# Patient Record
Sex: Female | Born: 1955 | Race: White | Hispanic: No | Marital: Married | Smoking: Never smoker
Health system: Southern US, Community
[De-identification: ages and names within clinical notes are randomized; demographics above are authoritative.]

## PROBLEM LIST (undated history)

## (undated) DIAGNOSIS — M8580 Other specified disorders of bone density and structure, unspecified site: Secondary | ICD-10-CM

## (undated) DIAGNOSIS — Z981 Arthrodesis status: Secondary | ICD-10-CM

## (undated) DIAGNOSIS — IMO0002 Reserved for concepts with insufficient information to code with codable children: Secondary | ICD-10-CM

## (undated) DIAGNOSIS — F419 Anxiety disorder, unspecified: Secondary | ICD-10-CM

## (undated) DIAGNOSIS — N329 Bladder disorder, unspecified: Secondary | ICD-10-CM

## (undated) DIAGNOSIS — IMO0001 Reserved for inherently not codable concepts without codable children: Secondary | ICD-10-CM

## (undated) DIAGNOSIS — M5030 Other cervical disc degeneration, unspecified cervical region: Secondary | ICD-10-CM

## (undated) DIAGNOSIS — M542 Cervicalgia: Secondary | ICD-10-CM

## (undated) DIAGNOSIS — Z8049 Family history of malignant neoplasm of other genital organs: Secondary | ICD-10-CM

## (undated) DIAGNOSIS — N809 Endometriosis, unspecified: Secondary | ICD-10-CM

## (undated) DIAGNOSIS — E7800 Pure hypercholesterolemia, unspecified: Secondary | ICD-10-CM

## (undated) DIAGNOSIS — Z800 Family history of malignant neoplasm of digestive organs: Secondary | ICD-10-CM

## (undated) DIAGNOSIS — R51 Headache: Secondary | ICD-10-CM

## (undated) DIAGNOSIS — M25529 Pain in unspecified elbow: Secondary | ICD-10-CM

## (undated) DIAGNOSIS — M47812 Spondylosis without myelopathy or radiculopathy, cervical region: Secondary | ICD-10-CM

## (undated) DIAGNOSIS — Z803 Family history of malignant neoplasm of breast: Secondary | ICD-10-CM

## (undated) DIAGNOSIS — M797 Fibromyalgia: Secondary | ICD-10-CM

## (undated) DIAGNOSIS — M1990 Unspecified osteoarthritis, unspecified site: Secondary | ICD-10-CM

## (undated) DIAGNOSIS — M25519 Pain in unspecified shoulder: Principal | ICD-10-CM

## (undated) DIAGNOSIS — J8283 Eosinophilic asthma: Secondary | ICD-10-CM

## (undated) DIAGNOSIS — E785 Hyperlipidemia, unspecified: Secondary | ICD-10-CM

## (undated) DIAGNOSIS — N3010 Interstitial cystitis (chronic) without hematuria: Secondary | ICD-10-CM

## (undated) HISTORY — DX: Headache: R51

## (undated) HISTORY — DX: Family history of malignant neoplasm of digestive organs: Z80.0

## (undated) HISTORY — DX: Hyperlipidemia, unspecified: E78.5

## (undated) HISTORY — DX: Endometriosis, unspecified: N80.9

## (undated) HISTORY — DX: Other specified disorders of bone density and structure, unspecified site: M85.80

## (undated) HISTORY — DX: Reserved for concepts with insufficient information to code with codable children: IMO0002

## (undated) HISTORY — DX: Other cervical disc degeneration, unspecified cervical region: M50.30

## (undated) HISTORY — DX: Arthrodesis status: Z98.1

## (undated) HISTORY — DX: Pure hypercholesterolemia, unspecified: E78.00

## (undated) HISTORY — DX: Pain in unspecified elbow: M25.529

## (undated) HISTORY — DX: Unspecified osteoarthritis, unspecified site: M19.90

## (undated) HISTORY — DX: Family history of malignant neoplasm of breast: Z80.3

## (undated) HISTORY — DX: Family history of malignant neoplasm of other genital organs: Z80.49

## (undated) HISTORY — DX: Cervicalgia: M54.2

## (undated) HISTORY — DX: Fibromyalgia: M79.7

## (undated) HISTORY — DX: Reserved for inherently not codable concepts without codable children: IMO0001

## (undated) HISTORY — DX: Spondylosis without myelopathy or radiculopathy, cervical region: M47.812

## (undated) HISTORY — DX: Pain in unspecified shoulder: M25.519

## (undated) HISTORY — DX: Eosinophilic asthma: J82.83

## (undated) HISTORY — DX: Anxiety disorder, unspecified: F41.9

## (undated) HISTORY — DX: Interstitial cystitis (chronic) without hematuria: N30.10

## (undated) HISTORY — DX: Bladder disorder, unspecified: N32.9

---
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
## 1996-01-29 HISTORY — PX: LAPAROSCOPIC ASSISTED VAGINAL HYSTERECTOMY: SHX5398

## 1996-01-29 HISTORY — PX: HYSTERECTOMY ABDOMINAL WITH SALPINGO-OOPHORECTOMY: SHX6792

## 1998-09-15 ENCOUNTER — Ambulatory Visit (HOSPITAL_COMMUNITY): Admission: RE | Admit: 1998-09-15 | Discharge: 1998-09-15 | Payer: Self-pay | Admitting: Obstetrics and Gynecology

## 1998-09-15 ENCOUNTER — Encounter: Payer: Self-pay | Admitting: Obstetrics and Gynecology

## 1998-12-22 ENCOUNTER — Other Ambulatory Visit: Admission: RE | Admit: 1998-12-22 | Discharge: 1998-12-22 | Payer: Self-pay | Admitting: Obstetrics and Gynecology

## 1999-10-05 ENCOUNTER — Encounter: Payer: Self-pay | Admitting: Obstetrics and Gynecology

## 1999-10-05 ENCOUNTER — Ambulatory Visit (HOSPITAL_COMMUNITY): Admission: RE | Admit: 1999-10-05 | Discharge: 1999-10-05 | Payer: Self-pay | Admitting: Obstetrics and Gynecology

## 1999-12-07 ENCOUNTER — Other Ambulatory Visit: Admission: RE | Admit: 1999-12-07 | Discharge: 1999-12-07 | Payer: Self-pay | Admitting: Obstetrics and Gynecology

## 2000-08-08 ENCOUNTER — Encounter: Admission: RE | Admit: 2000-08-08 | Discharge: 2000-08-08 | Payer: Self-pay | Admitting: Obstetrics and Gynecology

## 2000-08-08 ENCOUNTER — Encounter: Payer: Self-pay | Admitting: Obstetrics and Gynecology

## 2000-12-19 ENCOUNTER — Other Ambulatory Visit: Admission: RE | Admit: 2000-12-19 | Discharge: 2000-12-19 | Payer: Self-pay | Admitting: Obstetrics and Gynecology

## 2002-01-08 ENCOUNTER — Other Ambulatory Visit: Admission: RE | Admit: 2002-01-08 | Discharge: 2002-01-08 | Payer: Self-pay | Admitting: Obstetrics and Gynecology

## 2002-11-19 ENCOUNTER — Encounter: Admission: RE | Admit: 2002-11-19 | Discharge: 2002-11-19 | Payer: Self-pay | Admitting: Obstetrics and Gynecology

## 2002-11-19 ENCOUNTER — Encounter: Payer: Self-pay | Admitting: Obstetrics and Gynecology

## 2003-01-28 ENCOUNTER — Other Ambulatory Visit: Admission: RE | Admit: 2003-01-28 | Discharge: 2003-01-28 | Payer: Self-pay | Admitting: Obstetrics and Gynecology

## 2003-12-09 ENCOUNTER — Encounter: Admission: RE | Admit: 2003-12-09 | Discharge: 2003-12-09 | Payer: Self-pay | Admitting: Obstetrics and Gynecology

## 2004-12-04 ENCOUNTER — Ambulatory Visit: Payer: Self-pay | Admitting: Family Medicine

## 2004-12-21 ENCOUNTER — Encounter: Admission: RE | Admit: 2004-12-21 | Discharge: 2004-12-21 | Payer: Self-pay | Admitting: Obstetrics and Gynecology

## 2006-01-17 ENCOUNTER — Encounter: Admission: RE | Admit: 2006-01-17 | Discharge: 2006-01-17 | Payer: Self-pay | Admitting: Obstetrics and Gynecology

## 2006-01-28 ENCOUNTER — Ambulatory Visit: Payer: Self-pay | Admitting: Unknown Physician Specialty

## 2007-01-30 ENCOUNTER — Encounter: Admission: RE | Admit: 2007-01-30 | Discharge: 2007-01-30 | Payer: Self-pay | Admitting: Obstetrics & Gynecology

## 2007-07-01 ENCOUNTER — Encounter: Payer: Self-pay | Admitting: Family Medicine

## 2007-08-01 ENCOUNTER — Encounter: Payer: Self-pay | Admitting: Family Medicine

## 2007-08-31 ENCOUNTER — Encounter: Payer: Self-pay | Admitting: Family Medicine

## 2007-10-01 ENCOUNTER — Encounter: Payer: Self-pay | Admitting: Family Medicine

## 2007-11-01 ENCOUNTER — Encounter: Payer: Self-pay | Admitting: Family Medicine

## 2007-11-18 ENCOUNTER — Other Ambulatory Visit: Payer: Self-pay

## 2007-11-18 ENCOUNTER — Emergency Department: Payer: Self-pay | Admitting: Emergency Medicine

## 2008-03-11 ENCOUNTER — Other Ambulatory Visit: Admission: RE | Admit: 2008-03-11 | Discharge: 2008-03-11 | Payer: Self-pay | Admitting: Obstetrics and Gynecology

## 2008-03-11 ENCOUNTER — Encounter: Admission: RE | Admit: 2008-03-11 | Discharge: 2008-03-11 | Payer: Self-pay | Admitting: Obstetrics & Gynecology

## 2009-03-17 ENCOUNTER — Encounter: Admission: RE | Admit: 2009-03-17 | Discharge: 2009-03-17 | Payer: Self-pay | Admitting: Obstetrics & Gynecology

## 2010-04-06 ENCOUNTER — Encounter: Admission: RE | Admit: 2010-04-06 | Discharge: 2010-04-06 | Payer: Self-pay | Admitting: Obstetrics & Gynecology

## 2010-07-31 HISTORY — PX: BLADDER SUSPENSION: SHX72

## 2010-10-21 ENCOUNTER — Encounter: Payer: Self-pay | Admitting: Obstetrics & Gynecology

## 2010-10-29 ENCOUNTER — Other Ambulatory Visit (HOSPITAL_COMMUNITY): Payer: Self-pay | Admitting: Neurological Surgery

## 2010-10-29 DIAGNOSIS — M542 Cervicalgia: Secondary | ICD-10-CM

## 2010-10-31 HISTORY — PX: SPINAL FUSION: SHX223

## 2010-11-16 ENCOUNTER — Ambulatory Visit (HOSPITAL_COMMUNITY)
Admission: RE | Admit: 2010-11-16 | Discharge: 2010-11-16 | Disposition: A | Discharge status: Home / Self Care | Payer: 59 | Source: Ambulatory Visit | Attending: Neurological Surgery | Admitting: Neurological Surgery

## 2010-11-16 DIAGNOSIS — M542 Cervicalgia: Secondary | ICD-10-CM

## 2010-11-16 DIAGNOSIS — M47812 Spondylosis without myelopathy or radiculopathy, cervical region: Secondary | ICD-10-CM | POA: Insufficient documentation

## 2010-11-16 MED ORDER — IOHEXOL 300 MG/ML  SOLN
10.0000 mL | Freq: Once | INTRAMUSCULAR | Status: AC | PRN
  Administered 2010-11-16: 10 mL via INTRAVENOUS

## 2010-11-16 MED ORDER — IOHEXOL 180 MG/ML  SOLN: 20.0000 mL | Freq: Once | INTRAMUSCULAR | Status: DC | PRN

## 2010-11-24 ENCOUNTER — Inpatient Hospital Stay (HOSPITAL_COMMUNITY)
Admission: EM | Admit: 2010-11-24 | Discharge: 2010-11-28 | DRG: 983 | Disposition: A | Discharge status: Home / Self Care | Payer: 59 | Attending: Neurological Surgery | Admitting: Neurological Surgery

## 2010-11-24 DIAGNOSIS — IMO0001 Reserved for inherently not codable concepts without codable children: Secondary | ICD-10-CM | POA: Diagnosis present

## 2010-11-24 DIAGNOSIS — F3289 Other specified depressive episodes: Secondary | ICD-10-CM | POA: Diagnosis present

## 2010-11-24 DIAGNOSIS — F329 Major depressive disorder, single episode, unspecified: Secondary | ICD-10-CM | POA: Diagnosis present

## 2010-11-24 DIAGNOSIS — F411 Generalized anxiety disorder: Secondary | ICD-10-CM | POA: Diagnosis present

## 2010-11-24 DIAGNOSIS — T380X5A Adverse effect of glucocorticoids and synthetic analogues, initial encounter: Secondary | ICD-10-CM | POA: Diagnosis not present

## 2010-11-24 DIAGNOSIS — E785 Hyperlipidemia, unspecified: Secondary | ICD-10-CM | POA: Diagnosis present

## 2010-11-24 DIAGNOSIS — R110 Nausea: Secondary | ICD-10-CM | POA: Diagnosis present

## 2010-11-24 DIAGNOSIS — D72829 Elevated white blood cell count, unspecified: Secondary | ICD-10-CM | POA: Diagnosis not present

## 2010-11-24 DIAGNOSIS — R0789 Other chest pain: Principal | ICD-10-CM | POA: Diagnosis present

## 2010-11-24 DIAGNOSIS — R51 Headache: Secondary | ICD-10-CM | POA: Diagnosis present

## 2010-11-24 DIAGNOSIS — K219 Gastro-esophageal reflux disease without esophagitis: Secondary | ICD-10-CM | POA: Diagnosis present

## 2010-11-24 DIAGNOSIS — T50995A Adverse effect of other drugs, medicaments and biological substances, initial encounter: Secondary | ICD-10-CM | POA: Diagnosis present

## 2010-11-24 DIAGNOSIS — E039 Hypothyroidism, unspecified: Secondary | ICD-10-CM | POA: Diagnosis present

## 2010-11-24 DIAGNOSIS — M47812 Spondylosis without myelopathy or radiculopathy, cervical region: Secondary | ICD-10-CM | POA: Diagnosis present

## 2010-11-25 ENCOUNTER — Emergency Department (HOSPITAL_COMMUNITY): Payer: 59

## 2010-11-25 LAB — URINALYSIS, ROUTINE W REFLEX MICROSCOPIC
Hgb urine dipstick: NEGATIVE
Ketones, ur: NEGATIVE mg/dL
Nitrite: NEGATIVE
Urobilinogen, UA: 0.2 mg/dL (ref 0.0–1.0)

## 2010-11-25 LAB — COMPREHENSIVE METABOLIC PANEL
AST: 20 U/L (ref 0–37)
Albumin: 3.9 g/dL (ref 3.5–5.2)
Chloride: 98 mEq/L (ref 96–112)
Creatinine, Ser: 0.88 mg/dL (ref 0.4–1.2)
GFR calc non Af Amer: >60 mL/min (ref >60)
Sodium: 130 mEq/L — ABNORMAL LOW (ref 135–145)
Total Protein: 7 g/dL (ref 6.0–8.3)

## 2010-11-25 LAB — CBC
HCT: 39.2 % (ref 36.0–46.0)
MCH: 29.2 pg (ref 26.0–34.0)
MCV: 88.1 fL (ref 78.0–100.0)
Platelets: 219 10*3/uL (ref 150–400)
WBC: 6.5 10*3/uL (ref 4.0–10.5)

## 2010-11-25 LAB — PROTIME-INR
INR: 0.87 (ref 0.00–1.49)
Prothrombin Time: 12 seconds (ref 11.6–15.2)

## 2010-11-25 LAB — LIPID PANEL
HDL: 67 mg/dL (ref >39)
Total CHOL/HDL Ratio: 3.4 RATIO

## 2010-11-25 LAB — POCT CARDIAC MARKERS
CKMB, poc: <1 ng/mL — ABNORMAL LOW (ref 1.0–8.0)
Myoglobin, poc: 45 ng/mL (ref 12–200)
Troponin i, poc: <0.05 ng/mL (ref 0.00–0.09)
Troponin i, poc: <0.05 ng/mL (ref 0.00–0.09)

## 2010-11-25 LAB — CK TOTAL AND CKMB (NOT AT ARMC): CK, MB: 0.9 ng/mL (ref 0.3–4.0)

## 2010-11-25 LAB — CARDIAC PANEL(CRET KIN+CKTOT+MB+TROPI)
Relative Index: INVALID (ref 0.0–2.5)
Relative Index: INVALID (ref 0.0–2.5)
Total CK: 57 U/L (ref 7–177)
Troponin I: <0.01 ng/mL (ref 0.00–0.06)

## 2010-11-25 LAB — TROPONIN I: Troponin I: <0.01 ng/mL (ref 0.00–0.06)

## 2010-11-25 LAB — DIFFERENTIAL
Lymphs Abs: 2.4 10*3/uL (ref 0.7–4.0)
Monocytes Absolute: 0.5 10*3/uL (ref 0.1–1.0)
Monocytes Relative: 8 % (ref 3–12)

## 2010-11-25 LAB — TSH: TSH: 6.469 u[IU]/mL — ABNORMAL HIGH (ref 0.350–4.500)

## 2010-11-26 DIAGNOSIS — R209 Unspecified disturbances of skin sensation: Secondary | ICD-10-CM

## 2010-11-26 LAB — COMPREHENSIVE METABOLIC PANEL
ALT: 17 U/L (ref 0–35)
Alkaline Phosphatase: 41 U/L (ref 39–117)
BUN: 11 mg/dL (ref 6–23)
CO2: 24 mEq/L (ref 19–32)
Chloride: 106 mEq/L (ref 96–112)
GFR calc non Af Amer: >60 mL/min (ref >60)
Glucose, Bld: 156 mg/dL — ABNORMAL HIGH (ref 70–99)
Potassium: 4.1 mEq/L (ref 3.5–5.1)
Sodium: 135 mEq/L (ref 135–145)
Total Bilirubin: 0.2 mg/dL — ABNORMAL LOW (ref 0.3–1.2)
Total Protein: 6.2 g/dL (ref 6.0–8.3)

## 2010-11-26 LAB — CBC
HCT: 39.3 % (ref 36.0–46.0)
Hemoglobin: 12.9 g/dL (ref 12.0–15.0)
MCV: 89.5 fL (ref 78.0–100.0)
RBC: 4.39 MIL/uL (ref 3.87–5.11)
WBC: 10 10*3/uL (ref 4.0–10.5)

## 2010-11-26 LAB — CARDIAC PANEL(CRET KIN+CKTOT+MB+TROPI): Troponin I: 0.02 ng/mL (ref 0.00–0.06)

## 2010-11-26 LAB — SEDIMENTATION RATE: Sed Rate: 9 mm/hr (ref 0–22)

## 2010-11-27 ENCOUNTER — Observation Stay (HOSPITAL_COMMUNITY): Payer: 59

## 2010-11-27 LAB — CBC
HCT: 35.4 % — ABNORMAL LOW (ref 36.0–46.0)
MCH: 28.9 pg (ref 26.0–34.0)
MCV: 88.3 fL (ref 78.0–100.0)
RDW: 13.6 % (ref 11.5–15.5)
WBC: 18.7 10*3/uL — ABNORMAL HIGH (ref 4.0–10.5)

## 2010-11-27 LAB — DIFFERENTIAL
Eosinophils Relative: 0 % (ref 0–5)
Lymphocytes Relative: 5 % — ABNORMAL LOW (ref 12–46)
Lymphs Abs: 1 10*3/uL (ref 0.7–4.0)
Monocytes Relative: 1 % — ABNORMAL LOW (ref 3–12)

## 2010-11-27 LAB — URINALYSIS, ROUTINE W REFLEX MICROSCOPIC
Bilirubin Urine: NEGATIVE
Hgb urine dipstick: NEGATIVE
Ketones, ur: NEGATIVE mg/dL
Protein, ur: NEGATIVE mg/dL
Urobilinogen, UA: 0.2 mg/dL (ref 0.0–1.0)

## 2010-11-27 LAB — BASIC METABOLIC PANEL
BUN: 11 mg/dL (ref 6–23)
Chloride: 109 mEq/L (ref 96–112)
Glucose, Bld: 158 mg/dL — ABNORMAL HIGH (ref 70–99)
Potassium: 4.2 mEq/L (ref 3.5–5.1)

## 2010-11-27 LAB — HEMOGLOBIN A1C: Mean Plasma Glucose: 123 mg/dL — ABNORMAL HIGH (ref <117)

## 2010-11-27 LAB — T3, FREE: T3, Free: 2.1 pg/mL — ABNORMAL LOW (ref 2.3–4.2)

## 2010-11-27 LAB — T4: T4, Total: 3.6 ug/dL — ABNORMAL LOW (ref 5.0–12.5)

## 2010-11-27 LAB — TSH: TSH: 0.921 u[IU]/mL (ref 0.350–4.500)

## 2010-11-28 LAB — CBC
MCH: 29.2 pg (ref 26.0–34.0)
MCV: 88.9 fL (ref 78.0–100.0)
Platelets: 234 10*3/uL (ref 150–400)
RDW: 13.8 % (ref 11.5–15.5)

## 2010-11-28 LAB — BASIC METABOLIC PANEL
BUN: 8 mg/dL (ref 6–23)
Chloride: 103 mEq/L (ref 96–112)
Creatinine, Ser: 0.63 mg/dL (ref 0.4–1.2)
GFR calc non Af Amer: >60 mL/min (ref >60)

## 2010-11-28 LAB — ANA: Anti Nuclear Antibody(ANA): POSITIVE — AB

## 2010-11-28 LAB — ANTI-NUCLEAR AB-TITER (ANA TITER): ANA Titer 1: NEGATIVE

## 2010-12-01 NOTE — Consult Note (Signed)
NAMESTEFANA, [name] Brown                 ACCOUNT NO.:  [id number]  MEDICAL RECORD NO.:  [id number]           PATIENT TYPE:  I  LOCATION:  3733                         FACILITY:  [facility]  PHYSICIAN:  Thana Farr, MD    DATE OF BIRTH:  Mar 09, 1956  DATE OF CONSULTATION:  11/25/2010 DATE OF DISCHARGE:                                CONSULTATION   CONSULT CALLED BY:  Eduard Clos, MD  HISTORY:  Ms. Falcon is a 55 year old female that was admitted with complaints of chest pain.  She reports that she had a myelogram on November 17, 2010.  She did well for the first 48 hours.  After the first 48 hours, she developed a headache.  It has been fairly persistent since that time.  She describes the headache as being frontal and throbbing.  It is associated with nausea.  It has been as severe as a 9/10 at times.  It is not as severe currently.  It does seem that there have been medications that could decrease the severity of the headache, but nothing has taken it away completely.  She does not describe any aggravation of the pain, by any movements or any position.  PAST MEDICAL HISTORY: 1. Depression and anxiety. 2. Cervical radiculopathy.  MEDICATIONS:  Paxil and Xanax.  SOCIAL HISTORY:  The patient is married.  She has no history of alcohol, tobacco, or illicit drug abuse.  PHYSICAL EXAMINATION:  Blood pressure 112/75, heart rate 73, respiratory rate 20, and temperature 98.8.  On mental status testing, the patient is alert and oriented.  She can follow commands without difficulty.  Speech is fluent.  On cranial nerve testing, II, visual fields full.  III, IV, and VI, extraocular movements are intact.  V and VII, smile symmetric. VIII, grossly intact.  IX and VII, positive gag.  XI, bilateral shoulder shrug.  XII, midline tongue extension.  On motor exam, the patient is 5/5 throughout.  There is normal tone and bulk.  Sensory, pinprick and light touch are intact bilaterally.  Deep  tendon reflexes are 2+ throughout.  Plantars are downgoing bilaterally.  On cerebellar testing, finger-to-nose and heel-to-shin intact.  LABORATORY DATA:  White blood cell count 6.5, platelet count 219, hemoglobin and hematocrit 13 and 39.2 respectively.  PT/INR 12.0 and 0.87.  Sodium 130, potassium 3.5, chloride 98, CO2 of 28, BUN 15, creatinine 0.88, glucose 92, total bili 0.3, alk phos 52, SGOT/SGPT 20 and 80 respectively.  Total protein 7.0, albumin 3.9, calcium 8.6, CK 57.  TSH 6.469.  Urinalysis unremarkable.  Head CT, normal noncontrast CT of the brain.  ASSESSMENT:  Ms. Nyland is a 55 year old female that has developed a headache since her myelogram on November 17, 2010.  The current features of the headaches are not suggestive of a spinal headache.  They do not seem to have ever had those particular characteristics.  She may be experiencing a sensitivity reaction to the contrast dye used.  Cannot rule out the possibility that her headache may be related to her abnormal thyroid function test as well.  PLAN: 1. The patient is to have followup  of her abnormal thyroid studies and     an ESR. 2. The patient is to have Solu-Medrol 125 mg IV x1 now, then we would     start prednisone on November 26, 2010, at 60 mg a day with taper     based on the patient's response with pain.          ______________________________ Thana Farr, MD     LR/MEDQ  D:  11/25/2010  T:  11/26/2010  Job:  [id number]  Electronically Signed by Thana Farr MD on 12/01/2010 02:02:43 PM

## 2010-12-10 ENCOUNTER — Ambulatory Visit (HOSPITAL_COMMUNITY): Admission: RE | Admit: 2010-12-10 | Payer: 59 | Source: Ambulatory Visit | Admitting: Neurological Surgery

## 2010-12-11 NOTE — Consult Note (Signed)
  [name] Brown, [name] Brown                 ACCOUNT NO.:  [id number]  MEDICAL RECORD NO.:  [id number]           PATIENT TYPE:  I  LOCATION:  3020                         FACILITY:  [facility]  PHYSICIAN:  Stefani Dama, M.D.  DATE OF BIRTH:  [birth year]  DATE OF CONSULTATION:  11/26/2010 DATE OF DISCHARGE:                                CONSULTATION   REQUESTING PHYSICIAN:  Dr. Noel Gerold, [facility].  REASON FOR REQUEST:  Cervical radiculopathy, intractable neck pain.  HISTORY OF PRESENT ILLNESS:  [name] Brown is a 55 year old white female who was seen by me over several months.  She has been complaining of difficulty with neck pain in the last week.  We did a myelogram and postmyelogram CAT scan.  I discussed the studies with her immediately after they were completed and indicated that she had spondylitic disease in the cervical spine.  It is planned that sometime in mid March she would undergo anterior cervical decompression arthrodesis at C5-C7.  The patient had developed worsening neck pain and headache.  This was initially thought that this may be related to a myelogram and her postmyelogram syndrome.  However, she notes that after the myelogram for the first 2 days, she felt fairly well.  She subsequently developed headache, neck pain, stiffness, shoulder pain on the left side with pain in the chest wall.  Because of the nature of the chest wall pain, she was admitted to the hospital and she was evaluated cardiologically and found to be within limits of normal.  Because of the cervical difficulties, we are consulted now to help manage her neck pain or cervical radiculopathy and possibly advance to schedule her planned surgical intervention.  PAST MEDICAL HISTORY:  Noted in the chart, is not repeated here.  PHYSICAL EXAMINATION:  She is alert, oriented.  She does not have any meningismus.  Neck moves well, although she has some limited range of motion turning side-to-side 6  degrees.  She flexes and extends modestly. Her motor strength is good in the deltoids, biceps, triceps, grips, and intrinsics though there is give-way in the biceps and triceps testing and the grip strength would be graded at 4/5.  Her sensation appears intact.  Reflexes are 2+ and symmetric in the biceps and triceps, 2+ in the grip.  IMPRESSION:  Review of the patient's myelogram and postmyelogram CAT scan demonstrates spondylitic processes at C5-6 and C6-7.  There is no absolute urgency regarding surgery; however, given that the patient is hospitalized with substantial neck pain, not responsive to multiple medications including intravenous steroid medication, I discussed proceeding with surgery on the more urgent basis.  She is eager to do this because she is tired of the neck pain.  We will plan on scheduling her surgery in the next day or so to accommodate her needs.    Stefani Dama, M.D.    Merla Riches  D:  11/27/2010  T:  11/28/2010  Job:  [id number]  Electronically Signed by Barnett Abu M.D. on 12/11/2010 09:29:59 AM

## 2010-12-11 NOTE — Op Note (Signed)
NAMECHANNA, HAZELETT                 ACCOUNT NO.:  [id number]  MEDICAL RECORD NO.:  [id number]           PATIENT TYPE:  I  LOCATION:  3020                         FACILITY:  [facility]  PHYSICIAN:  Stefani Dama, M.D.  DATE OF BIRTH:  12/31/1955  DATE OF PROCEDURE:  11/27/2010 DATE OF DISCHARGE:                              OPERATIVE REPORT   PREOPERATIVE DIAGNOSES:  Cervical spondylosis with radiculopathy, C5-6 and C6-7.  POSTOPERATIVE DIAGNOSIS:  Cervical spondylosis with radiculopathy, C5-6 and C6-7.  PROCEDURE:  Anterior cervical decompression, C5-6 and C6-7, arthrodesis with structural allograft, Alphatec plate fixation, C5 to C7.  SURGEON:  Stefani Dama, MD  FIRST ASSISTANT:  Cristi Loron, MD  ANESTHESIA:  General endotracheal.  INDICATIONS:  [name] Brown is a 55 year old individual who has had significant neck, shoulder, and arm pain, worse on left side.  She had myelography which showed recapitulated findings seen on MRI some time ago.  She had a severe exacerbation of pain requiring hospital stay and workup of her cardiac status yielded no problems.  The patient did have myelogram recently that showed spondylitic disease at C5-6 and C6-7. Having failed efforts at conservative management, she is taken now to the operating room to undergo decompression of these 2 levels.  PROCEDURE:  The patient was brought to the operating room supine on the stretcher.  After smooth induction of general endotracheal anesthesia, the neck was prepped with alcohol and DuraPrep and draped in a sterile fashion.  A transverse incision was created on the left side of the neck.  This was carried down through the platysma.  The plane between the sternocleidomastoid and strap muscles dissected bluntly and the prevertebral space was reached.  First identifiable disk space was noted to be that of C5-6 on localizing radiograph and dissection was carried out so as to expose the spaces at  C5-6 and C6-7.  Procedure was started by opening the anterior longitudinal ligament of C6-7, first with 15 blade and then using a combination of Leksell rongeur to remove some ventral osteophytes and disk space was entered and a significant quantity of severely desiccated disk material was encountered.  This was evacuated until the region of posterior longitudinal ligament was reached where a high-speed drill and 2-mm bit was used to remove osteophytosis from the inferior margin of the disk space at C6 and out to and including the uncinate processes bilaterally.  Once these were decompressed, the common dural tube and the nerve roots could well be decompressed by taking up the disk material right up into the subligamentous space.  Small bony spurs from the inferior margin of body of C6 were removed also.  Once decompression was obtained thoroughly and hemostasis was established, a 7-mm transgraft was shaved to the appropriate size and configuration to fit in the interspace.  This was filled with demineralized bone matrix.  Attention was then turned to C5- 6 where a similar procedure was carried out evacuating a significant quantity of severely degenerated and desiccated disk material from within the disk space.  Ventral and lateral osteophytes were also removed in a similar fashion.  With  good decompression being obtained and hemostasis being established, a 7-mm transgraft was shaped to the appropriate size and configuration and filled with demineralized bone matrix and fitted into the intervertebral space in a similar fashion. The lateral recesses were also filled with some bone graft in the form of demineralized bone matrix.  Then, a 30-mm Trestle plate was fitted to the ventral aspect the vertebral bodies from C5 to C7 and secured with 12-mm variable angle screws.  Final localizing x-ray was obtained which showed good position of the implanted hardware.  The wound was then copiously  irrigated with antibiotic-irrigating solution and then the platysma was closed with 3-0 Vicryl in inverted interrupted fashion and 3-0 Vicryl in subcuticular tissues.  Dermabond was placed on the skin. The patient tolerated the procedure well and was returned to recovery room in stable condition.     Stefani Dama, M.D.     Merla Riches  D:  11/27/2010  T:  11/28/2010  Job:  [id number]  Electronically Signed by Barnett Abu M.D. on 12/11/2010 09:29:56 AM

## 2010-12-31 NOTE — H&P (Signed)
Brown Brown                 ACCOUNT NO.:  [id number]  MEDICAL RECORD NO.:  [id number]           PATIENT TYPE:  LOCATION:                                 FACILITY:  PHYSICIAN:  Eduard Clos, MDDATE OF BIRTH:  1955-11-15  DATE OF ADMISSION: DATE OF DISCHARGE:                             HISTORY & PHYSICAL   PRIMARY CARE PHYSICIAN:  Dr. Nida Boatman.  CHIEF COMPLAINT:  Chest pain.  HISTORY OF PRESENT ILLNESS:  This 55 year old female with a history of depression presented complaining of chest pain.  The patient states chest pain started last evening at around 8:30 which is retrosternal, nonradiating, pressure like, associated with mild shortness of breath. The chest pain lasted for almost 2 hours, it got better after the patient came to the ER and got some nitroglycerin.  In addition, the patient has been having some headache.  The headache has been there for almost 2 years but has worsened in the last one week after the patient had a myelogram.  The headache is all over the head.  Denies any associated visual symptoms or any focal deficit.  The patient does have chronic right-sided upper extremity mild weakness, for which Dr. Danielle Dess is planning to do a cervical surgery later on and for the same reason, the patient had a myelogram.  The patient denies any nausea, vomiting, abdominal pain, dysuria, discharge, diarrhoea, or any fever or chills, cough, or phlegm.  PAST MEDICAL HISTORY:  Depression and anxiety and has been having chronic right-sided weakness with neck pain.  PAST SURGICAL HISTORY:  Hysterectomy.  MEDICATIONS PRIOR TO ADMISSION: 1. Paxil 40 mg p.o. daily. 2. Xanax 1 mg p.o. at bedtime.  FAMILY HISTORY:  Positive for breast cancer.  The patient is aware of breast cancer screening.  It is also positive for coronary artery disease in her dad who also subsequently underwent cardiac transplant.  ALLERGIES:  CODEINE.  SOCIAL HISTORY:  The patient is  married, lives with her husband, is a full code, denies smoking cigarettes, drinking alcohol, using illegal drugs.  REVIEW OF SYSTEMS:  As per the history of present illness, nothing else significant.  PHYSICAL EXAMINATION:  GENERAL:  The patient is examined at bedside, not in acute distress. VITAL SIGNS:  Blood pressure 120/66, pulse 75 per minute, temperature 97.8, respirations 18 per minute, O2 sat 100%. HEENT:  Anicteric.  No pallor.  No discharge from ears, eyes, nose, or mouth. CHEST:  Bilateral air entry present.  No rhonchi, no crepitation. HEART:  S1 and S2 heard. ABDOMEN:  Soft, nontender.  Bowel sounds heard. CENTRAL NERVOUS SYSTEM:  The patient is alert, awake, and oriented to time, place, and person.  Moves upper and lower extremities 5/5. EXTREMITIES:  Peripheral pulses felt.  No edema.  LABORATORY DATA:  EKG shows normal sinus rhythm with heart rate of 82 beats per minute with nonspecific ST-T changes. Chest x-ray shows no acute cardiopulmonary abnormality. CT of the head without contrast shows normal noncontrast CT appearance of the brain, intrathecal subarachnoid contrast has cleared, chronic sinus disease status post bilateral sinus surgery. CBC:  WBC 6.5, hemoglobin is 13,  hematocrit is 39.2, platelets 219, eosinophils 11%.  PT and INR are 12 and 0.87.  Complete metabolic panel: Sodium 130, potassium 3.4, chloride 98, carbon dioxide 28, glucose 92, BUN 15, creatinine 0.8.  Total bilirubin is 0.3, alkaline phosphatase is 52, AST 20, ALT 18, total protein 7, albumin 3.9, calcium 8.6.  CK-MB less than 1, troponin less than 0.05.  UA is negative.  ASSESSMENT: 1. Chest pain, rule out acute coronary syndrome. 2. Headache with recent myelogram. 3. 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 4. Eosinophilia, unclear etiology.  PLAN: 1. At this time, I will admit the patient to telemetry. 2. For her chest pain, the patient will be placed on p.r.n.      nitroglycerin and aspirin, cycle cardiac markers, get 2-D echo. 3. For her headache, at this time, as the patient had a recent     myelogram, we will IV hydrate.  The patient will be on p.r.n. pain     medication.  If the headache persists, then we may have to get a     consult and recommendations.     Eduard Clos, MD     ANK/MEDQ  D:  11/25/2010  T:  11/25/2010  Job:  [id number]  Electronically Signed by Midge Minium MD on 12/31/2010 07:19:11 AM

## 2011-01-14 NOTE — Discharge Summary (Signed)
  [name] Brown, [name] Brown                 ACCOUNT NO.:  [id number]  MEDICAL RECORD NO.:  [id number]           PATIENT TYPE:  I  LOCATION:  3020                         FACILITY:  [facility]  PHYSICIAN:  Stefani Dama, M.D.  DATE OF BIRTH:  1955/10/10  DATE OF ADMISSION:  11/24/2010 DATE OF DISCHARGE:  11/28/2010                              DISCHARGE SUMMARY   ADMITTING DIAGNOSES:  Headache, cervical spondylosis radiculopathy C5-6 and C6-7.  DISCHARGE DIAGNOSES:  Headache, cervical spondylosis radiculopathy C5-6 and C6-7, atypical chest pain, negative for cardiac event, mild hypothyroidism.  OPERATIONS AND PROCEDURES:  Anterior cervical decompression and fusion C5-6, C6-7.  CONSULTATIONS:  The patient was admitted on [facility]. Dr. Barnett Abu was consulted for the Neurosurgical consult for cervical spine spondylosis and radiculopathy transferred to his service on November 26, 2010.  She is a 55 year old female who had significant neck, shoulder, and arm pain worse on left side.  Myelography showed some spondylitic changes at C5-6, C6-7, failed conservative management, taken to the operating to decompress and infuse these 2 levels.  She tolerated this procedure well, stabilized in recovery room, placed on the floor for pain management physical therapy, occupational therapy.  Surgery was actually on November 27, 2010.  Postoperatively, she was doing well, eating well, voiding well, ambulating safely with following neck precautions ready for discharge home.  Cardiac status was stable.  DISCHARGE CONDITION:  Stable, improved.  DISCHARGE INSTRUCTIONS:  Discharged home and follow neck precautions, soft cervical collar for comfort.  Home medications are to be continued. She should take: 1. Vitamin D 50,000 units every other week. 2. Tramadol 50 mg p.o. daily p.r.n. pain. 3. Paxil 40 mg p.o. daily. 4. Multivitamins over the counter p.o. daily. 5. Magnesium  over-the-counter 1 tablet p.o. bedtime. 6. Fish oil over-the-counter twice daily. 7. Alprazolam 1 mg p.o. bedtime p.r.n.  She is given prescription for Lipitor, Valium, and Percocet.  Her IV was discontinued prior to discharge home.  Contact our office prior to follow up with any question or concerns.  Follow up in 3-4 weeks. Continue with neck precautions.  Regular diet, may shower.  All questions encouraged and answered.  She is to follow up with her cardiologist in about 2-3 weeks.  She is placed back on Lipitor because she does have hypercholesterolemia and is to be discussed with her cardiologist where she will continue on this.  DISCHARGE CONDITION:  Stable and improved.     [name] Brown.   ______________________________ Stefani Dama, M.D.    SCI/MEDQ  D:  12/27/2010  T:  12/28/2010  Job:  [id number]  Electronically Signed by Orlin Hilding P.A. on 01/03/2011 12:51:45 PM Electronically Signed by Barnett Abu M.D. on 01/14/2011 10:12:03 AM

## 2011-04-02 ENCOUNTER — Ambulatory Visit: Payer: Self-pay | Admitting: Unknown Physician Specialty

## 2011-04-08 ENCOUNTER — Other Ambulatory Visit: Payer: Self-pay | Admitting: Obstetrics & Gynecology

## 2011-04-08 DIAGNOSIS — Z1231 Encounter for screening mammogram for malignant neoplasm of breast: Secondary | ICD-10-CM

## 2011-04-15 ENCOUNTER — Ambulatory Visit
Admission: RE | Admit: 2011-04-15 | Discharge: 2011-04-15 | Disposition: A | Discharge status: Home / Self Care | Payer: 59 | Source: Ambulatory Visit | Attending: Obstetrics & Gynecology | Admitting: Obstetrics & Gynecology

## 2011-04-15 DIAGNOSIS — Z1231 Encounter for screening mammogram for malignant neoplasm of breast: Secondary | ICD-10-CM

## 2011-07-24 ENCOUNTER — Ambulatory Visit: Payer: Self-pay | Admitting: Orthopedic Surgery

## 2011-07-31 ENCOUNTER — Ambulatory Visit: Payer: Self-pay | Admitting: Orthopedic Surgery

## 2011-08-01 HISTORY — PX: ELBOW SURGERY: SHX618

## 2012-03-09 ENCOUNTER — Other Ambulatory Visit: Payer: Self-pay | Admitting: Obstetrics & Gynecology

## 2012-03-09 DIAGNOSIS — Z1231 Encounter for screening mammogram for malignant neoplasm of breast: Secondary | ICD-10-CM

## 2012-04-23 ENCOUNTER — Ambulatory Visit
Admission: RE | Admit: 2012-04-23 | Discharge: 2012-04-23 | Disposition: A | Discharge status: Home / Self Care | Payer: 59 | Source: Ambulatory Visit | Attending: Obstetrics & Gynecology | Admitting: Obstetrics & Gynecology

## 2012-04-23 DIAGNOSIS — Z1231 Encounter for screening mammogram for malignant neoplasm of breast: Secondary | ICD-10-CM

## 2012-11-05 DIAGNOSIS — N3010 Interstitial cystitis (chronic) without hematuria: Secondary | ICD-10-CM | POA: Insufficient documentation

## 2013-03-29 ENCOUNTER — Encounter: Payer: Self-pay | Admitting: Neurology

## 2013-03-29 ENCOUNTER — Ambulatory Visit (INDEPENDENT_AMBULATORY_CARE_PROVIDER_SITE_OTHER): Payer: Medicare Other | Admitting: Neurology

## 2013-03-29 VITALS — BP 108/62 | HR 72 | Ht 60.5 in | Wt 114.0 lb

## 2013-03-29 DIAGNOSIS — IMO0001 Reserved for inherently not codable concepts without codable children: Secondary | ICD-10-CM

## 2013-03-29 DIAGNOSIS — R52 Pain, unspecified: Secondary | ICD-10-CM

## 2013-03-29 DIAGNOSIS — R51 Headache: Secondary | ICD-10-CM | POA: Insufficient documentation

## 2013-03-29 DIAGNOSIS — M797 Fibromyalgia: Secondary | ICD-10-CM

## 2013-03-29 DIAGNOSIS — R519 Headache, unspecified: Secondary | ICD-10-CM | POA: Insufficient documentation

## 2013-03-29 MED ORDER — DULOXETINE HCL 60 MG PO CPEP: 60.0000 mg | ORAL_CAPSULE | Freq: Every day | ORAL | Status: DC

## 2013-03-29 MED ORDER — AMITRIPTYLINE HCL 25 MG PO TABS: 25.0000 mg | ORAL_TABLET | Freq: Every day | ORAL | Status: DC

## 2013-03-29 NOTE — Patient Instructions (Addendum)
I think overall you are doing fairly well but I do want to suggest a few things today:  Remember to drink plenty of fluid, eat healthy meals and do not skip any meals. Try to eat protein with a every meal and eat a healthy snack such as fruit or nuts in between meals. Try to keep a regular sleep-wake schedule and try to exercise daily, particularly in the form of walking, 20-30 minutes a day, if you can.   Please remember, common headache triggers are: sleep deprivation, dehydration, overheating, stress, hypoglycemia or skipping meals, excessive pain medications or excessive alcohol use. Some people have food triggers such as aged cheese, orange juice or chocolate, especially dark chocolate. Try to avoid these headache triggers as much possible. It may be helpful to keep a headache diary to figure out what makes your headaches worse or brings them on. Some people report headache onset after exercise but studies have shown that regular exercise may actually prevent headaches from coming on. If you have exercise-induced headaches, please make sure that you drink plenty of fluid before and after exercising and that you don't over do it.  As far as your medications are concerned, I would like to suggest no changes. I have refilled you meds.    As far as diagnostic testing: no new test needed at this time.   Our phone number is [phone]. We also have an after hours call service for urgent matters and there is a physician on-call for urgent questions. For any emergencies you know to call 911 or go to the nearest emergency room.   I suggest followup with one of my colleagues in about 6 months. Alverda Skeans, one of our nurses will call you for your appointment.

## 2013-03-29 NOTE — Progress Notes (Signed)
Subjective:    Patient ID: [name] Brown is a 57 y.o. female.  HPI   Interim history:   [name] Brown is a very pleasant 57 year old right-handed woman who presents for followup consultation for her pain syndrome. She is unaccompanied today. This is her first visit and she previously followed with Dr. Fayrene Fearing love and was last seen by him on 11/27/2012, at which time he wanted to increase her Cymbalta. However, she had some wt gain and did not go up to 90 mg. He felt that she had generalized pain and localized pain in her right elbow, both legs and left knee in particular. She has an underlying medical history of hyperlipidemia, migraine headaches, anxiety, osteoarthritis, musculoskeletal pain, and fibromyalgia. She had neck surgery in February 2012 with titanium plate, bladder surgery, tonsillectomy, hysterectomy, right elbow surgery. She is currently on Cymbalta 60 mg daily, omeprazole, amitriptyline 25 mg daily, omega-3, vitamin D, magnesium, Xanax 1 mg once daily, multivitamin.   I reviewed Dr. Imagene Gurney prior notes and the patient's records and below is a summary of that review:  2 role right-handed woman who started seeing Dr. love in April 2009 for chronic neck pain, unexplained weakness and vague symptoms of pain. She has a known history of migraine headaches that occur in the frontal regions, associated with nausea and vomiting. Inferior 2009 she developed sudden onset of numbness and tingling from head to toes associated with difficulty walking. MRI brain in April 2009 was normal and cervical spine MRI in February 2009 showed spondylosis. She had a fairly normal neurological exam. She had neck surgery in February 2012 under Dr. Danielle Dess. In May 2011 she developed pain in her right arm and elbow. EMG and nerve conduction studies in 2012 was normal. MRI elbow in May 2012 was normal. She had physical therapy without improvement. She had elbow surgery on the right. She stopped working in 2 2012. She saw a  rheumatologist in January 2012 for a positive ANA, one is to 80 titer, negative rheumatoid factor, normal CBC, normal ESR and there was no specific evidence of rheumatological disease but she was diagnosed with fibromyalgia. In February 2012 she was admitted to the hospital for chest pain and her TSH was elevated. [facility] spotted fever titer, Lyme titer were normal in June 2012. She takes care of her mother. Lyrica and gabapentin caused tingling. Effexor caused chest pain and Flexeril cause confusion. She had bladder surgery in November 2011. Water therapy has aggravated her interstitial cystitis. She has a history of dream enactments.   She has no new complaints. She has been having intermittent headaches. She is a retired [employer] forces operational officer. She has had difficulty with repetitive movements on the R hand. She has intermittent R hand numbness, after her neck and elbow surgery.   Her Past Medical History Is Significant For: Past Medical History  Diagnosis Date  . Headache(784.0)   . Myalgia and myositis, unspecified   . Unspecified disorder of bladder   . Other and unspecified hyperlipidemia   . Osteoarthrosis, unspecified whether generalized or localized, unspecified site   . Pain in joint, upper arm   . Cervical spondylosis without myelopathy   . Degeneration of cervical intervertebral disc     Her Past Surgical History Is Significant For: Past Surgical History  Procedure Laterality Date  . Spine surgery  10/2010  . Bladder surgery  07/2010  . Tonsillectomy  1969  . Abdominal hysterectomy  01/1996  . Elbow surgery Right 08/2011    Her  Family History Is Significant For: Family History  Problem Relation Age of Onset  . Osteoarthritis Mother   . Osteoarthritis Maternal Grandfather   . Cancer Paternal Grandmother     Her Social History Is Significant For: History   Social History  . Marital Status: Divorced    Spouse Name: N/A    Number of Children: N/A  . Years of Education:  N/A   Social History Main Topics  . Smoking status: Never Smoker   . Smokeless tobacco: None  . Alcohol Use: 0.5 oz/week    1 drink(s) per week  . Drug Use: No  . Sexually Active: None   Other Topics Concern  . None   Social History Narrative  . None    Her Allergies Are:  Allergies  Allergen Reactions  . Codeine   :   Her Current Medications Are:  Outpatient Encounter Prescriptions as of 03/29/2013  Medication Sig Dispense Refill  . ALPRAZolam (XANAX) 1 MG tablet Take 1 mg by mouth at bedtime as needed for sleep.      Marland Kitchen amitriptyline (ELAVIL) 25 MG tablet Take 1 tablet (25 mg total) by mouth at bedtime.  30 tablet  5  . DULoxetine (CYMBALTA) 60 MG capsule Take 1 capsule (60 mg total) by mouth daily.  30 capsule  5  . fish oil-omega-3 fatty acids 1000 MG capsule Take 960 mg by mouth daily.      . Magnesium 500 MG CAPS Take 1 capsule by mouth at bedtime.      . Multiple Vitamins-Minerals (MULTIVITAMIN PO) Take 1 tablet by mouth daily.      Marland Kitchen omeprazole (PRILOSEC) 40 MG capsule Take 40 mg by mouth daily.      . [DISCONTINUED] amitriptyline (ELAVIL) 25 MG tablet Take 25 mg by mouth at bedtime.      . [DISCONTINUED] DULoxetine (CYMBALTA) 60 MG capsule Take 60 mg by mouth daily.       No facility-administered encounter medications on file as of 03/29/2013.   Review of Systems  Constitutional: Positive for fatigue.  Gastrointestinal: Positive for constipation.  Endocrine:       Hot flashes  Genitourinary:       Incontinence  Musculoskeletal: Positive for myalgias and arthralgias.  Neurological: Positive for weakness.  Psychiatric/Behavioral: Positive for sleep disturbance (insomnia). The patient is nervous/anxious.     Objective:  Neurologic Exam  Physical Exam Physical Examination:   Filed Vitals:   03/29/13 0940  BP: 108/62  Pulse: 72    General Examination: The patient is a very pleasant 57 y.o. female in no acute distress. She appears well-developed and  well-nourished and well groomed.   HEENT: Normocephalic, atraumatic, pupils are equal, round and reactive to light and accommodation. Funduscopic exam is normal with sharp disc margins noted. Extraocular tracking is good without limitation to gaze excursion or nystagmus noted. Normal smooth pursuit is noted. Hearing is grossly intact. Tympanic membranes are clear bilaterally. Face is symmetric with normal facial animation and normal facial sensation. Speech is clear with no dysarthria noted. There is no hypophonia. There is no lip, neck/head, jaw or voice tremor. Neck is supple with full range of passive and active motion. There are no carotid bruits on auscultation. Oropharynx exam reveals: mild mouth dryness, good dental hygiene and mild airway crowding, due to narrow airway. Mallampati is class II. Tongue protrudes centrally and palate elevates symmetrically.   Chest: Clear to auscultation without wheezing, rhonchi or crackles noted.  Heart: S1+S2+0, regular and normal without  murmurs, rubs or gallops noted.   Abdomen: Soft, non-tender and non-distended with normal bowel sounds appreciated on auscultation.  Extremities: There is no pitting edema in the distal lower extremities bilaterally. Pedal pulses are intact.  Skin: Warm and dry without trophic changes noted. There are no varicose veins.  Musculoskeletal: exam reveals no obvious joint deformities, tenderness or joint swelling or erythema.   Neurologically:  Mental status: The patient is awake, alert and oriented in all 4 spheres. Her memory, attention, language and knowledge are appropriate. There is no aphasia, agnosia, apraxia or anomia. Speech is clear with normal prosody and enunciation. Thought process is linear. Mood is congruent and affect is normal.  Cranial nerves are as described above under HEENT exam. In addition, shoulder shrug is normal with equal shoulder height noted.  Motor exam: Normal bulk, strength and tone is noted.  There is no drift, tremor or rebound. Romberg is negative. Reflexes are 2+ throughout. Toes are downgoing bilaterally. Fine motor skills are intact with normal finger taps, normal hand movements, normal rapid alternating patting, normal foot taps and normal foot agility.  Cerebellar testing shows no dysmetria or intention tremor on finger to nose testing. Heel to shin is unremarkable bilaterally. There is no truncal or gait ataxia.  Sensory exam is intact to light touch, pinprick, vibration, temperature sense and proprioception in the upper and lower extremities. She has some pain with palpation of her joints and muscle and with reflex testing.  Gait, station and balance are unremarkable. No veering to one side is noted. No leaning to one side is noted. Posture is age-appropriate and stance is narrow based. No problems turning are noted. She turns en bloc. Tandem walk is slightly difficult. Intact toe and heel stance is noted.                Assessment and Plan:   Assessment and Plan:  In summary, AMERIS AKAMINE is a very pleasant 57 y.o.-year old female with a history of FMS and diffuse pain as well as HAs, now fairly well controlled on a combination of amitriptyline and Cymbalta. Her physical exam is stable and fairly non-focal with some evidence of diffusely painful areas, but not severe. She feels well overall, compared to when she first started coming to see Dr. Sandria Manly in 2009. I reassured the patient in that regard.  I had a long chat with the patient about my findings. We talked about medical treatments and non-pharmacological approaches. We talked about maintaining a healthy lifestyle in general. I encouraged the patient to eat healthy, exercise daily and keep well hydrated, to keep a scheduled bedtime and wake time routine, to not skip any meals and eat healthy snacks in between meals and to have protein with every meal.   As far as further diagnostic testing is concerned, I suggested the following  today: no new test needed.  As far as medications are concerned, I recommended the following at this time: no change.  I recommended that she see one of my associates in 6 months for routine FU. I refilled her medications today. I answered all her questions today and the patient was in agreement with the above outlined plan.

## 2013-04-09 ENCOUNTER — Other Ambulatory Visit: Payer: Self-pay

## 2013-04-09 DIAGNOSIS — Z1231 Encounter for screening mammogram for malignant neoplasm of breast: Secondary | ICD-10-CM

## 2013-05-04 ENCOUNTER — Ambulatory Visit
Admission: RE | Admit: 2013-05-04 | Discharge: 2013-05-04 | Disposition: A | Discharge status: Home / Self Care | Payer: Medicare Other | Source: Ambulatory Visit

## 2013-05-04 DIAGNOSIS — Z1231 Encounter for screening mammogram for malignant neoplasm of breast: Secondary | ICD-10-CM

## 2013-05-06 ENCOUNTER — Other Ambulatory Visit: Payer: Self-pay | Admitting: Obstetrics & Gynecology

## 2013-05-06 DIAGNOSIS — N644 Mastodynia: Secondary | ICD-10-CM

## 2013-06-11 ENCOUNTER — Ambulatory Visit
Admission: RE | Admit: 2013-06-11 | Discharge: 2013-06-11 | Disposition: A | Discharge status: Home / Self Care | Payer: Medicare Other | Source: Ambulatory Visit | Attending: Obstetrics & Gynecology | Admitting: Obstetrics & Gynecology

## 2013-06-11 DIAGNOSIS — N644 Mastodynia: Secondary | ICD-10-CM

## 2013-06-22 ENCOUNTER — Ambulatory Visit: Payer: Self-pay | Admitting: Certified Nurse Midwife

## 2013-07-03 ENCOUNTER — Emergency Department: Payer: Self-pay | Admitting: Emergency Medicine

## 2013-07-03 LAB — COMPREHENSIVE METABOLIC PANEL
Albumin: 3.7 g/dL (ref 3.4–5.0)
Alkaline Phosphatase: 79 U/L (ref 50–136)
Anion Gap: 6 — ABNORMAL LOW (ref 7–16)
BUN: 22 mg/dL — ABNORMAL HIGH (ref 7–18)
Bilirubin,Total: 0.3 mg/dL (ref 0.2–1.0)
Calcium, Total: 9.4 mg/dL (ref 8.5–10.1)
Chloride: 104 mmol/L (ref 98–107)
Co2: 29 mmol/L (ref 21–32)
Creatinine: 0.79 mg/dL (ref 0.60–1.30)
EGFR (Non-African Amer.): >60
Glucose: 83 mg/dL (ref 65–99)
Osmolality: 280 (ref 275–301)
Potassium: 3.9 mmol/L (ref 3.5–5.1)
SGPT (ALT): 37 U/L (ref 12–78)

## 2013-07-03 LAB — TROPONIN I: Troponin-I: <0.02 ng/mL

## 2013-07-03 LAB — CBC
HCT: 39.1 % (ref 35.0–47.0)
MCHC: 33.8 g/dL (ref 32.0–36.0)
Platelet: 216 10*3/uL (ref 150–440)
RBC: 4.44 10*6/uL (ref 3.80–5.20)

## 2013-07-05 ENCOUNTER — Encounter: Payer: Self-pay | Admitting: Certified Nurse Midwife

## 2013-07-06 ENCOUNTER — Encounter: Payer: Self-pay | Admitting: Certified Nurse Midwife

## 2013-07-06 ENCOUNTER — Ambulatory Visit (INDEPENDENT_AMBULATORY_CARE_PROVIDER_SITE_OTHER): Payer: Medicare Other | Admitting: Certified Nurse Midwife

## 2013-07-06 VITALS — BP 98/60 | HR 60 | Resp 16 | Ht 59.5 in | Wt 116.0 lb

## 2013-07-06 DIAGNOSIS — Z01419 Encounter for gynecological examination (general) (routine) without abnormal findings: Secondary | ICD-10-CM

## 2013-07-06 NOTE — Progress Notes (Signed)
57 y.o. [name] Brown Married Caucasian Fe here for annual exam. Menopausal no HRT or vaginal dryness. Recently chest pain under evaluation, all testing so far negative. Patient feeling better, but very tired. Sees PCP for aex, labs,  And medication management. Continues with some chest wall pain, no breast pain. Has nuclear testing in am. No gyn health issues today.  Patient's last menstrual period was 01/28/1997.          Sexually active: yes  The current method of family planning is status post hysterectomy.    Exercising: yes  walking Smoker:  no  Health Maintenance: Pap:  05-11-10 neg MMG:  06-11-13 Colonoscopy:  2013 BMD:   2012 TDaP:  2008 Labs: none Self breast exam: done monthly   reports that she has never smoked. She does not have any smokeless tobacco history on file. She reports that  drinks alcohol. She reports that she does not use illicit drugs.  Past Medical History  Diagnosis Date  . Headache(784.0)   . Myalgia and myositis, unspecified   . Unspecified disorder of bladder   . Other and unspecified hyperlipidemia   . Osteoarthrosis, unspecified whether generalized or localized, unspecified site   . Pain in joint, upper arm   . Cervical spondylosis without myelopathy   . Degeneration of cervical intervertebral disc   . Anxiety   . Endometriosis   . IC (interstitial cystitis)     Past Surgical History  Procedure Laterality Date  . Spine surgery  10/2010  . Bladder surgery  07/2010  . Tonsillectomy  1969  . Elbow surgery Right 08/2011  . Abdominal hysterectomy  01/1997    Current Outpatient Prescriptions  Medication Sig Dispense Refill  . ALPRAZolam (XANAX) 1 MG tablet Take by mouth. Takes 1 1/2 at bedtime as needed      . amitriptyline (ELAVIL) 25 MG tablet Take 1 tablet (25 mg total) by mouth at bedtime.  30 tablet  5  . DULoxetine (CYMBALTA) 60 MG capsule Take 1 capsule (60 mg total) by mouth daily.  30 capsule  5  . fish oil-omega-3 fatty acids 1000 MG capsule  Take 960 mg by mouth daily.      . Magnesium 500 MG CAPS Take 1 capsule by mouth at bedtime.      . Multiple Vitamins-Minerals (MULTIVITAMIN PO) Take 1 tablet by mouth daily.      Marland Kitchen omeprazole (PRILOSEC) 40 MG capsule Take 40 mg by mouth daily.       No current facility-administered medications for this visit.    Family History  Problem Relation Age of Onset  . Osteoarthritis Mother   . Osteoarthritis Maternal Grandfather   . Cancer Paternal Grandmother     tongue & throat  . Heart disease Father     ROS:  Pertinent items are noted in HPI.  Otherwise, a comprehensive ROS was negative.  Exam:   BP 98/60  Pulse 60  Resp 16  Ht 4' 11.5" (1.511 m)  Wt 116 lb (52.617 kg)  BMI 23.05 kg/m2  LMP 01/28/1997 Height: 4' 11.5" (151.1 cm)  Ht Readings from Last 3 Encounters:  07/06/13 4' 11.5" (1.511 m)  03/29/13 5' 0.5" (1.537 m)    General appearance: alert, cooperative and appears stated age Head: Normocephalic, without obvious abnormality, atraumatic Neck: no adenopathy, supple, symmetrical, trachea midline and thyroid normal to inspection and palpation Lungs: clear to auscultation bilaterally Breasts: normal appearance, no masses or tenderness, No nipple retraction or dimpling, No nipple discharge or bleeding, No  axillary or supraclavicular adenopathy Chest wall slight tender to touch, no masses palpated Heart: regular rate and rhythm Abdomen: soft, non-tender; no masses,  no organomegaly Extremities: extremities normal, atraumatic, no cyanosis or edema Skin: Skin color, texture, turgor normal. No rashes or lesions Lymph nodes: Cervical, supraclavicular, and axillary nodes normal. No abnormal inguinal nodes palpated Neurologic: Grossly normal   Pelvic: External genitalia:  no lesions              Urethra:  normal appearing urethra with no masses, tenderness or lesions              Bartholin's and Skene's: normal                 Vagina: normal appearing vagina with normal  color and discharge, no lesions              Cervix: absent              Pap taken: no Bimanual Exam:  Uterus:  uterus absent              Adnexa: normal adnexa and no mass, fullness, tenderness               Rectovaginal: Confirms               Anus:  normal sphincter tone, no lesions  A:  Well Woman with normal exam  Menopausal no HRT, S/P LAVH with BSO for endometrosis  Under evaluation with cardiology for chest pain, all screening at present negative  Chest wall pain continuing under evaluation with cardiology,     P:   Reviewed health and wellness pertinent to exam  Stressed continuing evaluation as planned with Cardiology  Pap smear as per guidelines   Mammogram yearly pap smear not taken today  counseled on breast self exam, mammography screening, menopause, adequate intake of calcium and vitamin D, diet and exercise  return annually or prn  An After Visit Summary was printed and given to the patient.

## 2013-07-06 NOTE — Patient Instructions (Addendum)

## 2013-07-15 NOTE — Progress Notes (Signed)
Note reviewed, agree with plan.  Raeleen Winstanley, MD  

## 2013-07-28 ENCOUNTER — Other Ambulatory Visit: Payer: Self-pay | Admitting: Gastroenterology

## 2013-07-28 DIAGNOSIS — R1310 Dysphagia, unspecified: Secondary | ICD-10-CM

## 2013-07-29 ENCOUNTER — Ambulatory Visit
Admission: RE | Admit: 2013-07-29 | Discharge: 2013-07-29 | Disposition: A | Discharge status: Home / Self Care | Payer: Self-pay | Source: Ambulatory Visit | Attending: Gastroenterology | Admitting: Gastroenterology

## 2013-07-29 DIAGNOSIS — R1310 Dysphagia, unspecified: Secondary | ICD-10-CM

## 2013-08-23 ENCOUNTER — Telehealth: Payer: Self-pay | Admitting: Certified Nurse Midwife

## 2013-08-23 NOTE — Telephone Encounter (Signed)
Patient has some questions regarding the denial of her recent claim.

## 2013-09-14 ENCOUNTER — Telehealth: Payer: Self-pay | Admitting: Certified Nurse Midwife

## 2013-09-14 NOTE — Telephone Encounter (Signed)
Patient has questions about how her visit was filed.

## 2013-09-17 ENCOUNTER — Telehealth: Payer: Self-pay | Admitting: Neurology

## 2013-09-17 DIAGNOSIS — R51 Headache: Secondary | ICD-10-CM

## 2013-09-17 DIAGNOSIS — M797 Fibromyalgia: Secondary | ICD-10-CM

## 2013-09-17 DIAGNOSIS — R52 Pain, unspecified: Secondary | ICD-10-CM

## 2013-09-17 MED ORDER — DULOXETINE HCL 60 MG PO CPEP: 60.0000 mg | ORAL_CAPSULE | Freq: Every day | ORAL | Status: DC

## 2013-09-17 NOTE — Telephone Encounter (Signed)
Rx has been sent  

## 2013-10-06 ENCOUNTER — Ambulatory Visit: Payer: Medicare Other | Admitting: Neurology

## 2014-01-06 ENCOUNTER — Encounter: Payer: Self-pay | Admitting: Neurology

## 2014-01-06 ENCOUNTER — Encounter: Payer: Self-pay | Admitting: *Deleted

## 2014-01-11 ENCOUNTER — Encounter (INDEPENDENT_AMBULATORY_CARE_PROVIDER_SITE_OTHER): Payer: Self-pay

## 2014-01-11 ENCOUNTER — Encounter: Payer: Self-pay | Admitting: Neurology

## 2014-01-11 ENCOUNTER — Ambulatory Visit (INDEPENDENT_AMBULATORY_CARE_PROVIDER_SITE_OTHER): Payer: Medicare Other | Admitting: Neurology

## 2014-01-11 VITALS — BP 99/65 | HR 92 | Resp 16 | Ht 60.25 in | Wt 124.0 lb

## 2014-01-11 DIAGNOSIS — IMO0001 Reserved for inherently not codable concepts without codable children: Secondary | ICD-10-CM

## 2014-01-11 DIAGNOSIS — M25519 Pain in unspecified shoulder: Secondary | ICD-10-CM

## 2014-01-11 DIAGNOSIS — Z981 Arthrodesis status: Secondary | ICD-10-CM

## 2014-01-11 DIAGNOSIS — M797 Fibromyalgia: Secondary | ICD-10-CM

## 2014-01-11 DIAGNOSIS — M542 Cervicalgia: Secondary | ICD-10-CM

## 2014-01-11 DIAGNOSIS — N393 Stress incontinence (female) (male): Secondary | ICD-10-CM

## 2014-01-11 DIAGNOSIS — R51 Headache: Secondary | ICD-10-CM

## 2014-01-11 DIAGNOSIS — R52 Pain, unspecified: Secondary | ICD-10-CM

## 2014-01-11 HISTORY — DX: Arthrodesis status: Z98.1

## 2014-01-11 HISTORY — DX: Cervicalgia: M54.2

## 2014-01-11 HISTORY — DX: Pain in unspecified shoulder: M25.519

## 2014-01-11 MED ORDER — DULOXETINE HCL 20 MG PO CPEP: 20.0000 mg | ORAL_CAPSULE | Freq: Every day | ORAL | Status: DC

## 2014-01-11 MED ORDER — DULOXETINE HCL 60 MG PO CPEP: 60.0000 mg | ORAL_CAPSULE | Freq: Every day | ORAL | Status: DC

## 2014-01-11 NOTE — Progress Notes (Signed)
Subjective:    Patient ID: [name] Brown is a 58 y.o. female.  HPI   Interim history:   [name] Brown is a very pleasant 58 year old right-handed woman who presents for followup .    She is unaccompanied today. This is her first visit  With me , she had seen last Dr Rexene Alberts in a single visit and she previously followed with Dr. Morene Antu.    She  was last seen by Dr Erling Cruz  on 11/27/2012, at which time he wanted to increase her Cymbalta. However, she had some  Constipation and weight  gain and thus did not go up to 90 mg. Neck  pain and localized pain in her right elbow, both legs and left knee in particular.   She has an underlying medical history of hyperlipidemia, migraine headaches, anxiety,  Angina ( negative cardiac work up in 2014-15) osteoarthritis, musculoskeletal pain/ fibromyalgia. She had neck surgery in February 2012 by Dr. Ellene Route with titanium plate, bladder surgery, tonsillectomy, hysterectomy, right elbow surgery.  Dr. Earlean Shawl has done a full, negative GI work up and she was seen for dysphagia / had her esophagus stretched.  Dr.  Rexene Alberts  Had  reviewed Dr. Tressia Danas prior notes and the patient's records and below is a summary of that review:  74 role right-handed woman,  who had started seeing Dr. Erling Cruz in April 2009 for chronic neck pain, unexplained weakness and vague symptoms of pain. She has a known history of migraine headaches that occur in the frontal regions, associated with nausea and vomiting. Inferior 2009 she developed sudden onset of numbness and tingling from head to toes associated with difficulty walking. MRI brain in April 2009 was normal and cervical spine MRI in February 2009 showed spondylosis. She had a fairly normal neurological exam.  She had neck surgery in February 2012 under Dr. Ellene Route. In May 2011 she developed pain in her right arm and elbow. EMG and nerve conduction studies in 2012 was normal. MRI elbow in May 2012 was normal. She had physical therapy without  improvement. She had elbow surgery on the right. She stopped working in 2 2012.  She saw a rheumatologist in January 2012 for a positive ANA, one is to 80 titer, negative rheumatoid factor, normal CBC, normal ESR and there was no specific evidence of rheumatological disease but she was diagnosed with fibromyalgia. In February 2012 she was admitted to the hospital for chest pain and her TSH was elevated. [facility] spotted fever titer, Lyme titer were normal in June 2012.   She takes care of her mother.her maternal family has a strong history of osteoarthritis.   Medications are poorly tolerated.  Lyrica and gabapentin caused tingling. Effexor caused chest pain and Flexeril cause confusion. She had bladder surgery in November 2011. Water therapy has aggravated her interstitial cystitis. She has a history of dream enactments. Antiinflammatory ; celebrex , ibuprofen were causing Gi upset. She takes now only fish oil.  She has no new complaints. She has been having intermittent headaches. She is a retired Copywriter, advertising ( retired in 2012) . She has had difficulty with repetitive movements on the R hand.  She has intermittent R hand numbness, after her neck and elbow surgery.   Her Past Medical History Is Significant For: Past Medical History  Diagnosis Date  . Headache(784.0)   . Myalgia and myositis, unspecified   . Unspecified disorder of bladder   . Other and unspecified hyperlipidemia   . Osteoarthrosis, unspecified whether generalized or  localized, unspecified site   . Pain in joint, upper arm   . Cervical spondylosis without myelopathy   . Degeneration of cervical intervertebral disc   . Anxiety   . Endometriosis   . IC (interstitial cystitis)   . High cholesterol   . Pain in joint, shoulder region 01/11/2014  . Cervicalgia 01/11/2014  . History of fusion of cervical spine 01/11/2014    Her Past Surgical History Is Significant For: Past Surgical History  Procedure Laterality Date   . Spine surgery  10/2010  . Bladder surgery  07/2010  . Tonsillectomy  1969  . Elbow surgery Right 08/2011  . Abdominal hysterectomy  01/1996    Her Family History Is Significant For: Family History  Problem Relation Age of Onset  . Osteoarthritis Mother   . Osteoarthritis Maternal Grandfather     Rheumatoid  . Cancer Paternal Grandmother     tongue & throat  . Heart disease Father   . Breast cancer      Aunt  . Breast cancer      Aunt    Her Social History Is Significant For: History   Social History  . Marital Status: Married    Spouse Name: Liliane Channel    Number of Children: 2  . Years of Education: Secretary/administrator   Social History Main Topics  . Smoking status: Never Smoker   . Smokeless tobacco: Never Used  . Alcohol Use: 0.0 - 0.5 oz/week    0-1 drink(s) per week  . Drug Use: No  . Sexual Activity: Yes    Partners: Male    Birth Control/ Protection: Surgical     Comment: LAVH   Other Topics Concern  . None   Social History Narrative   Patient is married IT sales professional) and lives at home with her husband.   Patient has a Transport planner.   Patient drinks one can of Diet Dr. Malachi Bonds daily.   Patient has a college education.   Patient is right- handed.   Patient has two children and two step-children.    Her Allergies Are:  Allergies  Allergen Reactions  . Codeine     hallucination  . Hydrocodone   . Minocin [Minocycline]   :   Her Current Medications Are:  Outpatient Encounter Prescriptions as of 01/11/2014  Medication Sig  . ALPRAZolam (XANAX) 1 MG tablet Take by mouth. Takes 1 1/2 at bedtime as needed  . amitriptyline (ELAVIL) 25 MG tablet Take 1 tablet (25 mg total) by mouth at bedtime.  . DULoxetine (CYMBALTA) 60 MG capsule Take 1 capsule (60 mg total) by mouth daily.  . fish oil-omega-3 fatty acids 1000 MG capsule Take 960 mg by mouth daily.  . Magnesium 500 MG CAPS Take 1 capsule by mouth at bedtime.  . Multiple Vitamins-Minerals (MULTIVITAMIN PO) Take 1  tablet by mouth daily.  Marland Kitchen omeprazole (PRILOSEC) 40 MG capsule Take 40 mg by mouth daily.   Review of Systems    Objective:  Neurologic Exam  Physical Exam Physical Examination:   Filed Vitals:   01/11/14 1509  BP: 99/65  Pulse: 92  Resp: 16    General Examination: The patient is a   pleasant 58 y.o. female in no acute distress. She  Is able to tell her medical history fluently,  has dramatic appears well-developed and well-nourished , well groomed.  Chest: Clear to auscultation without wheezing, rhonchi or crackles noted.  Heart: S1+S2+0, regular and normal without murmurs, rubs or gallops noted.   Abdomen:  Soft, non-tender and non-distended with normal bowel sounds appreciated on auscultation.  Extremities: There is no pitting edema in the distal lower extremities bilaterally. Pedal pulses are intact.  Skin: Warm and dry without trophic changes noted. There are no varicose veins.  Musculoskeletal: exam reveals no obvious joint deformities, tenderness or joint swelling or erythema.     HEENT: Normocephalic, atraumatic,There are no carotid bruits on auscultation.   Oropharynx exam reveals: mild mouth dryness, good dental hygiene and mild airway crowding, due to narrow airway. Mallampati is class II.   CN: pupils are equal, round and reactive to light and accommodation.Extraocular tracking is good without limitation to gaze excursion or nystagmus noted. Normal smooth gaze pursuit is noted.  Hearing is grossly intact. Face is symmetric with normal facial animation and normal facial sensation. Speech is clear with no dysarthria noted.  Neck is supple with full range of passive and active motion.  Tongue protrudes centrally and palate elevates symmetrically, Mallampati 3 with a neck circumference of 13 inches.   Neurologically:  Mental status: The patient is awake, alert and oriented in all 4 spheres.   Her memory, attention, language and knowledge are appropriate. There is  no aphasia, agnosia, apraxia or anomia.  Speech is clear with normal prosody and enunciation.  Thought process is linear. Mood is congruent and affect is normal.  Cranial nerves are as described above under HEENT exam. In addition, shoulder shrug is normal with equal shoulder height noted.  Motor exam: Normal bulk, strength and tone is noted. Reflexes are 2+ throughout.  Toes are downgoing bilaterally.  Fine motor skills are intact with normal finger taps, normal hand movements, Cerebellar testing shows no dysmetria , tremor .  Sensory exam is intact to light touch, pinprick, vibration, temperature . She has some pain with palpation at several of  her joints and  with ROM and reflex testing.  Gait, station and balance are unremarkable.  No veering to one side is noted.   No leaning to one side is noted. Posture is age-appropriate and stance is narrow based. No problems turning are noted. She turns en bloc. Tandem walk is slightly difficult. Intact toe and heel stance is noted.                Assessment and Plan:   Assessment and Plan:  In summary, KYRIANNA BARLETTA is a very pleasant 58 y.o.-year old female with a history of FMS and diffuse pain as well as HAs, now fairly well controlled on a combination of amitriptyline and Cymbalta. Her physical exam is stable and fairly non-focal with some evidence of diffusely painful areas, but not severe. She feels well overall, compared to when she first started coming to see Dr. Erling Cruz in 2009. I reassured the patient in that regard.  I had a long chat with the patient about my findings. We talked about medical treatments and non-pharmacological approaches. We talked about maintaining a healthy lifestyle in general. I encouraged the patient to eat healthy, exercise daily and keep well hydrated, to keep a scheduled bedtime and wake time routine, to not skip any meals and eat healthy snacks in between meals and to have protein with every meal.   As far as further  diagnostic testing is concerned, I reviewed Dr. Jannifer Franklin  EMG and NCS test  from 22 month ago , normal.   I recommended that she see her PCP for the mostly non- neurologic findings. She may want to keep her relationship with her rheumatolgist .  She will follow up in 12 month for a refill of her CYMBALTA . I adjusted the dose to 60 mg alternating with 90 mg every other day. No other changes in her medications today. I suggest for a neuromuscular colleague to follow up with her as I specialize in sleep medicine.  I answered all her questions today and the patient was in agreement with the above outlined plan.

## 2014-01-11 NOTE — Patient Instructions (Signed)
Duloxetine delayed-release capsules  What is this medicine?  DULOXETINE (doo LOX e teen) is an antidepressant. It is used to treat depression. It is also used to treat different types of chronic pain.  This medicine may be used for other purposes; ask your health care provider or pharmacist if you have questions.  COMMON BRAND NAME(S): Cymbalta  What should I tell my health care provider before I take this medicine?  They need to know if you have any of these conditions:  -bipolar disorder or a family history of bipolar disorder  -glaucoma  -kidney disease  -liver disease  -suicidal thoughts or a previous suicide attempt  -taken medicines called MAOIs like Carbex, Eldepryl, Marplan, Nardil, and Parnate within 14 days  -an unusual reaction to duloxetine, other medicines, foods, dyes, or preservatives  -pregnant or trying to get pregnant  -breast-feeding  How should I use this medicine?  Take this medicine by mouth with a glass of water. Follow the directions on the prescription label. Do not cut, crush or chew this medicine. You can take this medicine with or without food. Take your medicine at regular intervals. Do not take your medicine more often than directed. Do not stop taking this medicine suddenly except upon the advice of your doctor. Stopping this medicine too quickly may cause serious side effects or your condition may worsen.  A special MedGuide will be given to you by the pharmacist with each prescription and refill. Be sure to read this information carefully each time.  Talk to your pediatrician regarding the use of this medicine in children. Special care may be needed.  Overdosage: If you think you have taken too much of this medicine contact a poison control center or emergency room at once.  NOTE: This medicine is only for you. Do not share this medicine with others.  What if I miss a dose?  If you miss a dose, take it as soon as you can. If it is almost time for your next dose, take only that dose.  Do not take double or extra doses.  What may interact with this medicine?  Do not take this medicine with any of the following medications:  -certain diet drugs like dexfenfluramine, fenfluramine  -desvenlafaxine  -linezolid  -MAOIs like Azilect, Carbex, Eldepryl, Marplan, Nardil, and Parnate  -methylene blue (intravenous)  -milnacipran  -thioridazine  -venlafaxine  This medicine may also interact with the following medications:  -alcohol  -aspirin and aspirin-like medicines  -certain antibiotics like ciprofloxacin and enoxacin  -certain medicines for blood pressure, heart disease, irregular heart beat  -certain medicines for depression, anxiety, or psychotic disturbances  -certain medicines for migraine headache like almotriptan, eletriptan, frovatriptan, naratriptan, rizatriptan, sumatriptan, zolmitriptan  -certain medicines that treat or prevent blood clots like warfarin, enoxaparin, and dalteparin  -cimetidine  -fentanyl  -lithium  -NSAIDS, medicines for pain and inflammation, like ibuprofen or naproxen  -phentermine  -procarbazine  -sibutramine  -St. John's wort  -theophylline  -tramadol  -tryptophan  This list may not describe all possible interactions. Give your health care provider a list of all the medicines, herbs, non-prescription drugs, or dietary supplements you use. Also tell them if you smoke, drink alcohol, or use illegal drugs. Some items may interact with your medicine.  What should I watch for while using this medicine?  Tell your doctor if your symptoms do not get better or if they get worse. Visit your doctor or health care professional for regular checks on your progress. Because it may   take several weeks to see the full effects of this medicine, it is important to continue your treatment as prescribed by your doctor.  Patients and their families should watch out for new or worsening thoughts of suicide or depression. Also watch out for sudden changes in feelings such as feeling anxious,  agitated, panicky, irritable, hostile, aggressive, impulsive, severely restless, overly excited and hyperactive, or not being able to sleep. If this happens, especially at the beginning of treatment or after a change in dose, call your health care professional.  You may get drowsy or dizzy. Do not drive, use machinery, or do anything that needs mental alertness until you know how this medicine affects you. Do not stand or sit up quickly, especially if you are an older patient. This reduces the risk of dizzy or fainting spells. Alcohol may interfere with the effect of this medicine. Avoid alcoholic drinks.  This medicine can cause an increase in blood pressure. Check with your doctor for instructions on monitoring your blood pressure while taking this medicine.  Your mouth may get dry. Chewing sugarless gum or sucking hard candy, and drinking plenty of water may help. Contact your doctor if the problem does not go away or is severe.  What side effects may I notice from receiving this medicine?  Side effects that you should report to your doctor or health care professional as soon as possible:  -allergic reactions like skin rash, itching or hives, swelling of the face, lips, or tongue  -changes in blood pressure  -confusion  -dark urine  -dizziness  -fast talking and excited feelings or actions that are out of control  -fast, irregular heartbeat  -fever  -general ill feeling or flu-like symptoms  -hallucination, loss of contact with reality  -light-colored stools  -loss of balance or coordination  -redness, blistering, peeling or loosening of the skin, including inside the mouth  -right upper belly pain  -seizures  -suicidal thoughts or other mood changes  -trouble concentrating  -trouble passing urine or change in the amount of urine  -unusual bleeding or bruising  -unusually weak or tired  -yellowing of the eyes or skin  Side effects that usually do not require medical attention (report to your doctor or health care  professional if they continue or are bothersome):  -blurred vision  -change in appetite  -change in sex drive or performance  -headache  -increased sweating  -nausea  This list may not describe all possible side effects. Call your doctor for medical advice about side effects. You may report side effects to FDA at 1-800-FDA-1088.  Where should I keep my medicine?  Keep out of the reach of children.  Store at room temperature between 15 and 30 degrees C (59 and 86 degrees F). Throw away any unused medicine after the expiration date.  NOTE: This sheet is a summary. It may not cover all possible information. If you have questions about this medicine, talk to your doctor, pharmacist, or health care provider.  © 2014, [employer]. (2013-04-09 13:05:23)

## 2014-01-20 ENCOUNTER — Telehealth: Payer: Self-pay

## 2014-01-20 NOTE — Telephone Encounter (Signed)
[employer] sent [place] a letter saying they have approved our request for coverage on Duloxetine (Cymbalta) effective until 09/29/2014 Ref # [id number]

## 2014-05-18 ENCOUNTER — Other Ambulatory Visit: Payer: Self-pay

## 2014-05-18 DIAGNOSIS — Z1231 Encounter for screening mammogram for malignant neoplasm of breast: Secondary | ICD-10-CM

## 2014-06-13 ENCOUNTER — Ambulatory Visit: Payer: Medicare Other

## 2014-06-17 ENCOUNTER — Ambulatory Visit
Admission: RE | Admit: 2014-06-17 | Discharge: 2014-06-17 | Disposition: A | Discharge status: Home / Self Care | Payer: Medicare Other | Source: Ambulatory Visit

## 2014-06-17 ENCOUNTER — Encounter (INDEPENDENT_AMBULATORY_CARE_PROVIDER_SITE_OTHER): Payer: Self-pay

## 2014-06-17 DIAGNOSIS — Z1231 Encounter for screening mammogram for malignant neoplasm of breast: Secondary | ICD-10-CM

## 2014-07-08 ENCOUNTER — Encounter: Payer: Self-pay | Admitting: Certified Nurse Midwife

## 2014-07-08 ENCOUNTER — Ambulatory Visit (INDEPENDENT_AMBULATORY_CARE_PROVIDER_SITE_OTHER): Payer: Medicare Other | Admitting: Certified Nurse Midwife

## 2014-07-08 VITALS — BP 102/64 | HR 68 | Resp 16 | Ht 59.5 in | Wt 124.0 lb

## 2014-07-08 DIAGNOSIS — Z01419 Encounter for gynecological examination (general) (routine) without abnormal findings: Secondary | ICD-10-CM

## 2014-07-08 NOTE — Progress Notes (Signed)
58 y.o. [id number] Married Caucasian Fe here for annual exam. Menopausal no HRT. Fibromyalgia flaring now has appointment with MD, for extensive work up again with labs and exam. Denies vaginal bleeding or vaginal dryness. Sees PCP prn also. Weaning down on Xanax. No other health issues.  Patient's last menstrual period was 01/28/1997.          Sexually active: Yes.    The current method of family planning is status post hysterectomy.    Exercising: Yes.    walking Smoker:  no  Health Maintenance: Pap: 05-11-10 neg MMG:  06-17-14 density category c, birads category 1:neg Colonoscopy:  2013 neg. BMD:   2012 TDaP:  2008 Labs: none Self breast exam: done monthly   reports that she has never smoked. She has never used smokeless tobacco. She reports that she drinks alcohol. She reports that she does not use illicit drugs.  Past Medical History  Diagnosis Date  . Headache(784.0)   . Myalgia and myositis, unspecified   . Unspecified disorder of bladder   . Other and unspecified hyperlipidemia   . Osteoarthrosis, unspecified whether generalized or localized, unspecified site   . Pain in joint, upper arm   . Cervical spondylosis without myelopathy   . Degeneration of cervical intervertebral disc   . Anxiety   . Endometriosis   . IC (interstitial cystitis)   . High cholesterol   . Pain in joint, shoulder region 01/11/2014  . Cervicalgia 01/11/2014  . History of fusion of cervical spine 01/11/2014    Past Surgical History  Procedure Laterality Date  . Spine surgery  10/2010  . Bladder surgery  07/2010  . Tonsillectomy  1969  . Elbow surgery Right 08/2011  . Abdominal hysterectomy  01/1996    Current Outpatient Prescriptions  Medication Sig Dispense Refill  . ALPRAZolam (XANAX) 0.25 MG tablet Take 0.25 mg by mouth at bedtime as needed for anxiety.      Marland Kitchen amitriptyline (ELAVIL) 25 MG tablet Take 1 tablet (25 mg total) by mouth at bedtime.  30 tablet  5  . DULoxetine (CYMBALTA) 60 MG  capsule Take 1 capsule (60 mg total) by mouth daily.  90 capsule  3  . fish oil-omega-3 fatty acids 1000 MG capsule Take 960 mg by mouth daily.      . Magnesium 500 MG CAPS Take 1 capsule by mouth at bedtime.      . Multiple Vitamins-Minerals (MULTIVITAMIN PO) Take 1 tablet by mouth daily.      Marland Kitchen omeprazole (PRILOSEC) 40 MG capsule Take 40 mg by mouth daily.       No current facility-administered medications for this visit.    Family History  Problem Relation Age of Onset  . Osteoarthritis Mother   . Osteoarthritis Maternal Grandfather     Rheumatoid  . Cancer Paternal Grandmother     tongue & throat  . Heart disease Father   . Breast cancer      Aunt  . Breast cancer      Aunt    ROS:  Pertinent items are noted in HPI.  Otherwise, a comprehensive ROS was negative.  Exam:   BP 102/64  Pulse 68  Resp 16  Ht 4' 11.5" (1.511 m)  Wt 124 lb (56.246 kg)  BMI 24.64 kg/m2  LMP 01/28/1997 Height: 4' 11.5" (151.1 cm)  Ht Readings from Last 3 Encounters:  07/08/14 4' 11.5" (1.511 m)  01/11/14 5' 0.25" (1.53 m)  07/06/13 4' 11.5" (1.511 m)    General  appearance: alert, cooperative and appears stated age Head: Normocephalic, without obvious abnormality, atraumatic Neck: no adenopathy, supple, symmetrical, trachea midline and thyroid normal to inspection and palpation Lungs: clear to auscultation bilaterally Breasts: normal appearance, no masses or tenderness, No nipple retraction or dimpling, No nipple discharge or bleeding, No axillary or supraclavicular adenopathy Heart: regular rate and rhythm Abdomen: soft, non-tender; no masses,  no organomegaly Extremities: extremities normal, atraumatic, no cyanosis or edema Skin: Skin color, texture, turgor normal. No rashes or lesions Lymph nodes: Cervical, supraclavicular, and axillary nodes normal. No abnormal inguinal nodes palpated Neurologic: Grossly normal   Pelvic: External genitalia:  no lesions              Urethra:  normal  appearing urethra with no masses, tenderness or lesions              Bartholin's and Skene's: normal                 Vagina: normal appearing vagina with normal color and discharge, no lesions              Cervix: absent              Pap taken: No. Bimanual Exam:  Uterus:  uterus absent              Adnexa: no mass, fullness, tenderness and adnexa absent               Rectovaginal: Confirms               Anus:  normal sphincter tone, no lesions  A:  Well Woman with normal exam  Menopausal no HRT  Fibromyalgia   P:   Reviewed health and wellness pertinent to exam  Discussed importance of follow up with MD who is managing her Fibromyalgia  Pap smear not taken today  counseled on breast self exam, mammography screening, osteoporosis, adequate intake of calcium and vitamin D, diet and exercise  return annually or prn  An After Visit Summary was printed and given to the patient.

## 2014-07-08 NOTE — Progress Notes (Signed)
Reviewed personally.  M. Suzanne Maila Dukes, MD.  

## 2014-07-08 NOTE — Patient Instructions (Signed)

## 2014-08-01 ENCOUNTER — Encounter: Payer: Self-pay | Admitting: Certified Nurse Midwife

## 2014-09-12 ENCOUNTER — Telehealth: Payer: Self-pay

## 2014-09-12 NOTE — Telephone Encounter (Signed)
Called pt to give her bmd results. Pt notified. See scanned result.

## 2014-12-09 ENCOUNTER — Other Ambulatory Visit: Payer: Self-pay | Admitting: Gastroenterology

## 2014-12-09 DIAGNOSIS — R140 Abdominal distension (gaseous): Secondary | ICD-10-CM

## 2014-12-15 ENCOUNTER — Ambulatory Visit
Admission: RE | Admit: 2014-12-15 | Discharge: 2014-12-15 | Disposition: A | Discharge status: Home / Self Care | Payer: PPO | Source: Ambulatory Visit | Attending: Gastroenterology | Admitting: Gastroenterology

## 2014-12-15 DIAGNOSIS — R140 Abdominal distension (gaseous): Secondary | ICD-10-CM

## 2014-12-15 MED ORDER — IOPAMIDOL (ISOVUE-300) INJECTION 61%
100.0000 mL | Freq: Once | INTRAVENOUS | Status: AC | PRN
  Administered 2014-12-15: 100 mL via INTRAVENOUS

## 2015-01-06 ENCOUNTER — Other Ambulatory Visit: Payer: Self-pay | Admitting: Gastroenterology

## 2015-01-06 DIAGNOSIS — R1033 Periumbilical pain: Secondary | ICD-10-CM

## 2015-01-11 ENCOUNTER — Ambulatory Visit
Admission: RE | Admit: 2015-01-11 | Discharge: 2015-01-11 | Disposition: A | Discharge status: Home / Self Care | Payer: PPO | Source: Ambulatory Visit | Attending: Gastroenterology | Admitting: Gastroenterology

## 2015-01-11 ENCOUNTER — Other Ambulatory Visit: Payer: Self-pay | Admitting: Gastroenterology

## 2015-01-11 DIAGNOSIS — R1033 Periumbilical pain: Secondary | ICD-10-CM

## 2015-03-01 ENCOUNTER — Ambulatory Visit (INDEPENDENT_AMBULATORY_CARE_PROVIDER_SITE_OTHER): Payer: PPO | Admitting: Family Medicine

## 2015-03-01 ENCOUNTER — Encounter: Payer: Self-pay | Admitting: Family Medicine

## 2015-03-01 VITALS — BP 110/68 | HR 82 | Temp 97.4°F | Resp 16 | Wt 130.0 lb

## 2015-03-01 DIAGNOSIS — K589 Irritable bowel syndrome without diarrhea: Secondary | ICD-10-CM | POA: Insufficient documentation

## 2015-03-01 DIAGNOSIS — F32A Depression, unspecified: Secondary | ICD-10-CM | POA: Insufficient documentation

## 2015-03-01 DIAGNOSIS — M797 Fibromyalgia: Secondary | ICD-10-CM | POA: Diagnosis not present

## 2015-03-01 DIAGNOSIS — F329 Major depressive disorder, single episode, unspecified: Secondary | ICD-10-CM

## 2015-03-01 DIAGNOSIS — E039 Hypothyroidism, unspecified: Secondary | ICD-10-CM

## 2015-03-01 MED ORDER — CYCLOBENZAPRINE HCL 10 MG PO TABS: 10.0000 mg | ORAL_TABLET | Freq: Every day | ORAL | Status: DC

## 2015-03-01 MED ORDER — LEVOTHYROXINE SODIUM 50 MCG PO TABS: 50.0000 ug | ORAL_TABLET | Freq: Every day | ORAL | Status: DC

## 2015-03-01 NOTE — Patient Instructions (Addendum)
Instructed to change diet habits and cut back/stop dairy products and consider stopping gluten products if that does not help.  Give each change at least 2-4 weeks for effect.

## 2015-03-01 NOTE — Progress Notes (Signed)
Subjective:     Patient ID: [name] Brown, female   DOB: [birth year], 59 y.o.   MRN: [id number]  Anxiety    Abdominal Pain Associated symptoms include myalgias (fibromyalgia).     Depression, Follow-up   She  was last seen for this 6 months ago.  Changes made at last visit include . Pt reports that the last 2-3 months she feels her depression is getting worse because of the abdominal pain. She can not get any answers. " There is just something we are not getting to and I just keep on keeping on and it is getting discouraging"   She reports good compliance with treatment. She is not having side effects.    Current symptoms include: depressed mood, fatigue, hopelessness and insomnia  She feels she is Worse since last visit.  .Abdominal Pain Patient complains of abdominal pain. The pain is described as aching, and is 7/10 in intensity. The patient is experiencing epigastric and generalized pain without radiation. Onset was 5 months ago. Symptoms have been gradually worsening. Aggravating factors: none.  Alleviating factors: none. Associated symptoms: Pt reports that she has a bloating feeling all the time. She has been seeing GI in [place]. She has been on Omeprazole before and that has not helped the pain. She has had CT and showed mal-aligned intestine. They then did emptying studies and showed they were normal. Pt is concerned and wants to get down to the bottom of this problem.   Fibromyalgia Patient here for follow up on fibromyalgia. She is being followed by Rheumatology. She needs a refill on her Flexeril that she takes at night.  Irritable Bowel Syndrome Patient complains of symptoms of irritable bowel syndrome. Symptoms include abdominal pain, diarrhea/constipation cycles, and intermittent bright red blood per rectum. She also has diarrhea/constipation cycles. Stools are semi-formed to liquid. Sometimes it will alternate with constipation, hard and lumpy stools along with  defecatory straining. The pain is located in the entire abdomen. The pain is described as dull, and is 7/10 in intensity. Onset was 5 months ago. Symptoms have been gradually worsening since. Aggravating factors include none.  Alleviating factors include none. Associated symptoms include Bloating. The patient denies none. Psychosocial factors: anxiety: moderate. Work up so far: upper endoscopy and colonoscopy  result Normal.    Review of Systems  Constitutional: Positive for fatigue.  HENT: Negative.   Eyes: Negative.   Respiratory: Negative.   Cardiovascular: Negative.   Gastrointestinal: Positive for abdominal pain and abdominal distention.  Endocrine: Negative.   Genitourinary: Negative.   Musculoskeletal: Positive for myalgias (fibromyalgia).       Objective:   Physical Exam  Constitutional: She is oriented to person, place, and time. She appears well-developed and well-nourished.  BP 110/68 mmHg  Pulse 82  Temp(Src) 97.4 F (36.3 C) (Oral)  Resp 16  Wt 130 lb (58.968 kg)  LMP 01/28/1997   HENT:  Head: Normocephalic and atraumatic.  Eyes: Conjunctivae and EOM are normal. Pupils are equal, round, and reactive to light.  Neck: Normal range of motion. Neck supple.  Cardiovascular: Normal rate, regular rhythm, normal heart sounds and intact distal pulses.   Pulmonary/Chest: Effort normal and breath sounds normal.  Abdominal: Soft. There is tenderness (mild, diffuse).  Musculoskeletal: Normal range of motion.  Neurological: She is alert and oriented to person, place, and time.  Skin: Skin is warm and dry.  Psychiatric: She has a normal mood and affect. Her behavior is normal. Judgment and thought content normal.  Vitals  reviewed.      Assessment:    1. Hypothyroidism, unspecified hypothyroidism type New Diagnosis Start Levothyroxine recheck TSH in 3 months. - levothyroxine (SYNTHROID, LEVOTHROID) 50 MCG tablet; Take 1 tablet (50 mcg total) by mouth daily.  Dispense: 90  tablet; Refill: 3  2. Fibromyalgia Stable. - cyclobenzaprine (FLEXERIL) 10 MG tablet; Take 1 tablet (10 mg total) by mouth at bedtime. Take every night at bedtime  Dispense: 30 tablet; Refill: 5  3. IBS (irritable bowel syndrome) Patient Instructions  Instructed to change diet habits and cut back/stop dairy products and consider stopping gluten products if that does not help.       4. Depression Stable.

## 2015-03-02 ENCOUNTER — Ambulatory Visit: Payer: Self-pay | Admitting: Family Medicine

## 2015-03-08 ENCOUNTER — Encounter: Payer: Self-pay | Admitting: Family Medicine

## 2015-03-13 ENCOUNTER — Other Ambulatory Visit: Payer: Self-pay | Admitting: Emergency Medicine

## 2015-03-13 DIAGNOSIS — M797 Fibromyalgia: Secondary | ICD-10-CM

## 2015-03-13 MED ORDER — CYCLOBENZAPRINE HCL 10 MG PO TABS: 10.0000 mg | ORAL_TABLET | Freq: Every day | ORAL | Status: DC

## 2015-05-17 ENCOUNTER — Other Ambulatory Visit: Payer: Self-pay

## 2015-05-17 DIAGNOSIS — Z1231 Encounter for screening mammogram for malignant neoplasm of breast: Secondary | ICD-10-CM

## 2015-06-07 ENCOUNTER — Ambulatory Visit (INDEPENDENT_AMBULATORY_CARE_PROVIDER_SITE_OTHER): Payer: PPO | Admitting: Family Medicine

## 2015-06-07 ENCOUNTER — Encounter: Payer: Self-pay | Admitting: Family Medicine

## 2015-06-07 VITALS — BP 114/64 | HR 88 | Temp 98.0°F | Resp 16 | Wt 126.0 lb

## 2015-06-07 DIAGNOSIS — E785 Hyperlipidemia, unspecified: Secondary | ICD-10-CM

## 2015-06-07 DIAGNOSIS — E039 Hypothyroidism, unspecified: Secondary | ICD-10-CM | POA: Diagnosis not present

## 2015-06-07 DIAGNOSIS — Z23 Encounter for immunization: Secondary | ICD-10-CM

## 2015-06-07 NOTE — Progress Notes (Signed)
   Subjective:    Patient ID: [name] Brown, female    DOB: 03/15/1956, 59 y.o.   MRN: [id number]  HPI Comments: Pt was last seen on 03/01/2015. Pt started Levothyroxine 50 mcg (pt only taking 25 mcg) at LOV, as well as was told to change diet habits, and cut back or stop dairy products for IBS.  Last TSH was checked 11/29/2014 (by [facility]) and was 3.79.   Hypothyroidism [name] Brown is a 59 y.o. female who presents for follow up of hypothyroidism. Current symptoms: diarrhea, heat / cold intolerance and nervousness . Patient denies change in energy level, palpitations and weight changes. Symptoms have gradually improved.  Irritable Bowel Syndrome Patient complains of loose stools. Onset of symptoms was 1 year ago. Current symptoms: loose stools occurring 1 times per day, depression: mild, migraines: mild and fibromyalgia symptoms: moderate. Patient denies anorexia, chills, constipation, dysuria, fever, headache, hematochezia, hematuria, melena, nausea and vomiting. Symptoms have been well-controlled and gradually improved. Previous visits for this problem: yes, last seen 3 months ago by the patient's PCP. Evaluation to date has been CT: abnormal with "malrotated intsetine", EGD: normal and flexible sigmoidoscopy: normal. Treatment to date has been none.   Review of Systems  Constitutional: Positive for activity change (improved). Negative for fever, chills, diaphoresis, appetite change, fatigue and unexpected weight change.  Respiratory: Negative for cough, chest tightness, shortness of breath and wheezing.   Cardiovascular: Negative for chest pain, palpitations and leg swelling.  Gastrointestinal: Positive for diarrhea. Negative for nausea, vomiting, abdominal pain, constipation, blood in stool, abdominal distention and anal bleeding.       Dysphagia present  Endocrine: Positive for cold intolerance. Negative for heat intolerance.  Musculoskeletal: Positive for myalgias.       Objective:   Physical Exam  Constitutional: She is oriented to person, place, and time. She appears well-developed and well-nourished.  HENT:  Head: Normocephalic and atraumatic.  Right Ear: External ear normal.  Left Ear: External ear normal.  Nose: Nose normal.  Eyes: Conjunctivae are normal.  Neck: Neck supple.  Cardiovascular: Normal rate, regular rhythm and normal heart sounds.   Pulmonary/Chest: Effort normal and breath sounds normal.  Abdominal: Soft.  Neurological: She is alert and oriented to person, place, and time.  Skin: Skin is warm and dry.  Psychiatric: She has a normal mood and affect. Her behavior is normal. Judgment and thought content normal.          Assessment & Plan:  1. Hypothyroidism, unspecified hypothyroidism type Patient is clinically starting to feel better the last few weeks. Check TSH level- TSH  2. Hyperlipidemia  - Lipid Panel With LDL/HDL Ratio  3. Need for influenza vaccination  - Flu Vaccine QUAD 36+ mos IM 4. Chronic pain/fibromyalgia Stable  I have done the exam and reviewed the above chart and it is accurate to the best of my knowledge.

## 2015-06-24 LAB — LIPID PANEL WITH LDL/HDL RATIO
Cholesterol, Total: 277 mg/dL — ABNORMAL HIGH (ref 100–199)
HDL: 73 mg/dL (ref >39)
LDL CALC: 191 mg/dL — AB (ref 0–99)
LDl/HDL Ratio: 2.6 ratio units (ref 0.0–3.2)
Triglycerides: 65 mg/dL (ref 0–149)
VLDL Cholesterol Cal: 13 mg/dL (ref 5–40)

## 2015-06-24 LAB — TSH: TSH: 1.92 u[IU]/mL (ref 0.450–4.500)

## 2015-06-27 ENCOUNTER — Ambulatory Visit: Payer: PPO

## 2015-07-07 ENCOUNTER — Ambulatory Visit: Payer: PPO

## 2015-07-12 ENCOUNTER — Ambulatory Visit: Payer: Self-pay | Admitting: Certified Nurse Midwife

## 2015-07-13 ENCOUNTER — Ambulatory Visit: Payer: Self-pay | Admitting: Certified Nurse Midwife

## 2015-08-01 ENCOUNTER — Ambulatory Visit
Admission: RE | Admit: 2015-08-01 | Discharge: 2015-08-01 | Disposition: A | Discharge status: Home / Self Care | Payer: PPO | Source: Ambulatory Visit

## 2015-08-01 DIAGNOSIS — Z1231 Encounter for screening mammogram for malignant neoplasm of breast: Secondary | ICD-10-CM

## 2015-10-03 ENCOUNTER — Other Ambulatory Visit: Payer: Self-pay | Admitting: Family Medicine

## 2015-10-25 DIAGNOSIS — J329 Chronic sinusitis, unspecified: Secondary | ICD-10-CM | POA: Diagnosis not present

## 2015-10-25 DIAGNOSIS — B9689 Other specified bacterial agents as the cause of diseases classified elsewhere: Secondary | ICD-10-CM | POA: Diagnosis not present

## 2015-11-06 DIAGNOSIS — J324 Chronic pansinusitis: Secondary | ICD-10-CM | POA: Diagnosis not present

## 2015-11-06 DIAGNOSIS — J309 Allergic rhinitis, unspecified: Secondary | ICD-10-CM | POA: Diagnosis not present

## 2015-11-29 DIAGNOSIS — J324 Chronic pansinusitis: Secondary | ICD-10-CM | POA: Diagnosis not present

## 2015-12-04 ENCOUNTER — Encounter: Payer: Self-pay | Admitting: Family Medicine

## 2015-12-04 ENCOUNTER — Ambulatory Visit (INDEPENDENT_AMBULATORY_CARE_PROVIDER_SITE_OTHER): Payer: PPO | Admitting: Family Medicine

## 2015-12-04 VITALS — BP 110/60 | HR 80 | Temp 98.0°F | Resp 16 | Wt 131.0 lb

## 2015-12-04 DIAGNOSIS — M797 Fibromyalgia: Secondary | ICD-10-CM | POA: Diagnosis not present

## 2015-12-04 DIAGNOSIS — F329 Major depressive disorder, single episode, unspecified: Secondary | ICD-10-CM | POA: Diagnosis not present

## 2015-12-04 DIAGNOSIS — N393 Stress incontinence (female) (male): Secondary | ICD-10-CM | POA: Diagnosis not present

## 2015-12-04 DIAGNOSIS — R51 Headache: Secondary | ICD-10-CM

## 2015-12-04 DIAGNOSIS — R52 Pain, unspecified: Secondary | ICD-10-CM

## 2015-12-04 DIAGNOSIS — E785 Hyperlipidemia, unspecified: Secondary | ICD-10-CM

## 2015-12-04 DIAGNOSIS — M25519 Pain in unspecified shoulder: Secondary | ICD-10-CM | POA: Diagnosis not present

## 2015-12-04 DIAGNOSIS — F32A Depression, unspecified: Secondary | ICD-10-CM

## 2015-12-04 DIAGNOSIS — M542 Cervicalgia: Secondary | ICD-10-CM | POA: Diagnosis not present

## 2015-12-04 DIAGNOSIS — K589 Irritable bowel syndrome without diarrhea: Secondary | ICD-10-CM | POA: Diagnosis not present

## 2015-12-04 DIAGNOSIS — E039 Hypothyroidism, unspecified: Secondary | ICD-10-CM

## 2015-12-04 DIAGNOSIS — R519 Headache, unspecified: Secondary | ICD-10-CM

## 2015-12-04 MED ORDER — DULOXETINE HCL 60 MG PO CPEP: 60.0000 mg | ORAL_CAPSULE | Freq: Every day | ORAL | Status: DC

## 2015-12-04 MED ORDER — AMITRIPTYLINE HCL 25 MG PO TABS: 25.0000 mg | ORAL_TABLET | Freq: Every day | ORAL | Status: DC

## 2015-12-04 NOTE — Progress Notes (Signed)
Patient ID: [name] Brown, female   DOB: 11-17-1955, 60 y.o.   MRN: [id number]    Subjective:  HPI  Lipid/Cholesterol, Follow-up:   Last seen for this6 months ago.  Management changes since that visit include none. . Last Lipid Panel:    Component Value Date/Time   CHOL 277* 06/23/2015 1047   CHOL * 11/25/2010 0534    227        ATP III CLASSIFICATION:  <200     mg/dL   Desirable  200-239  mg/dL   Borderline High  >=240    mg/dL   High          TRIG 65 06/23/2015 1047   HDL 73 06/23/2015 1047   HDL 67 11/25/2010 0534   CHOLHDL 3.4 11/25/2010 0534   VLDL 8 11/25/2010 0534   LDLCALC 191* 06/23/2015 1047   LDLCALC * 11/25/2010 0534    152        Total Cholesterol/HDL:CHD Risk Coronary Heart Disease Risk Table                     Men   Women  1/2 Average Risk   3.4   3.3  Average Risk       5.0   4.4  2 X Average Risk   9.6   7.1  3 X Average Risk  23.4   11.0        Use the calculated Patient Ratio above and the CHD Risk Table to determine the patient's CHD Risk.        ATP III CLASSIFICATION (LDL):  <100     mg/dL   Optimal  100-129  mg/dL   Near or Above                    Optimal  130-159  mg/dL   Borderline  160-189  mg/dL   High  >190     mg/dL   Very High    She is not having side effects.  Current symptoms include none. Current exercise: none.   Wt Readings from Last 3 Encounters:  06/07/15 126 lb (57.153 kg)  03/01/15 130 lb (58.968 kg)  07/08/14 124 lb (56.246 kg)    -------------------------------------------------------------------  Pt is here for a follow up on Fibromyalgia and depression. She reports that she is frustrated because she can not physically do what she used to do, so that makes her feel down. She is keeping her 13 month old grandchild 5 days a week and that wears her out so that she does not feel like exercising therefore she has gained weight and that makes her feel worse about herself. She has a hard time sleeping. She has  problems going to sleep and staying asleep which can be contributing to the tiredness.   Prior to Admission medications   Medication Sig Start Date End Date Taking? Authorizing Provider  amitriptyline (ELAVIL) 25 MG tablet Take 1 tablet (25 mg total) by mouth at bedtime. 03/29/13  Yes Star Age, MD  cyclobenzaprine (FLEXERIL) 10 MG tablet TAKE ONE TABLET BY MOUTH AT BEDTIME 10/03/15  Yes Richard Maceo Pro., MD  DULoxetine (CYMBALTA) 60 MG capsule Take 1 capsule (60 mg total) by mouth daily. 01/11/14  Yes Carmen Dohmeier, MD  fish oil-omega-3 fatty acids 1000 MG capsule Take 960 mg by mouth daily.   Yes Historical Provider, MD  fluticasone (FLONASE) 50 MCG/ACT nasal spray Place 2 sprays into the nose daily. 12/29/14  Yes  Historical Provider, MD  levothyroxine (SYNTHROID, LEVOTHROID) 50 MCG tablet Take 1 tablet (50 mcg total) by mouth daily. Patient taking differently: Take 25 mcg by mouth daily.  03/01/15  Yes Richard Maceo Pro., MD  Magnesium 500 MG CAPS Take 1 capsule by mouth at bedtime.   Yes Historical Provider, MD  Multiple Vitamins-Minerals (MULTIVITAMIN PO) Take 1 tablet by mouth daily.   Yes Historical Provider, MD    Patient Active Problem List   Diagnosis Date Noted  . Hyperlipidemia 06/07/2015  . IBS (irritable bowel syndrome) 03/01/2015  . Depression 03/01/2015  . Fibromyalgia 03/01/2015  . Thyroid activity decreased 03/01/2015  . Pain in joint, shoulder region 01/11/2014  . Cervicalgia 01/11/2014  . History of fusion of cervical spine 01/11/2014  . Headache(784.0) 03/29/2013  . Chronic interstitial cystitis 11/05/2012    Past Medical History  Diagnosis Date  . Headache(784.0)   . Myalgia and myositis, unspecified   . Unspecified disorder of bladder   . Other and unspecified hyperlipidemia   . Osteoarthrosis, unspecified whether generalized or localized, unspecified site   . Pain in joint, upper arm   . Cervical spondylosis without myelopathy   . Degeneration of  cervical intervertebral disc   . Anxiety   . Endometriosis   . IC (interstitial cystitis)   . High cholesterol   . Pain in joint, shoulder region 01/11/2014  . Cervicalgia 01/11/2014  . History of fusion of cervical spine 01/11/2014    Social History   Social History  . Marital Status: Married    Spouse Name: Liliane Channel  . Number of Children: 2  . Years of Education: College   Occupational History  . Not on file.   Social History Main Topics  . Smoking status: Never Smoker   . Smokeless tobacco: Never Used  . Alcohol Use: Yes     Comment: 1 every 2 weeks  . Drug Use: No  . Sexual Activity:    Partners: Male    Birth Control/ Protection: Surgical     Comment: LAVH   Other Topics Concern  . 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   Patient has a Transport planner.   Patient drinks one can of Diet Dr. Malachi Bonds daily.   Patient has a college education.   Patient is right- handed.   Patient has two children and two step-children.    Allergies  Allergen Reactions  . Codeine     hallucination  . Hydrocodone   . Levofloxacin Other (See Comments)  . Minocin [Minocycline]   . Nsaids     Other reaction(s): OTHER    Review of Systems  Constitutional: Positive for malaise/fatigue.  HENT: Negative.   Eyes: Negative.   Respiratory: Negative.   Cardiovascular: Negative.   Gastrointestinal: Negative.   Genitourinary: Negative.   Musculoskeletal: Positive for myalgias and joint pain.  Skin: Negative.   Neurological: Negative.   Endo/Heme/Allergies: Negative.   Psychiatric/Behavioral: Negative.     Immunization History  Administered Date(s) Administered  . Influenza,inj,Quad PF,36+ Mos 06/07/2015  . Tdap 01/29/2007   Objective:  LMP 01/28/1997  Physical Exam  Constitutional: She is oriented to person, place, and time and well-developed, well-nourished, and in no distress.  HENT:  Head: Normocephalic  and atraumatic.  Right Ear: External ear normal.  Left Ear: External ear normal.  Nose: Nose normal.  Eyes: Conjunctivae and EOM are normal. Pupils are equal, round, and reactive  to light.  Neck: Normal range of motion. Neck supple.  Cardiovascular: Normal rate, regular rhythm, normal heart sounds and intact distal pulses.   Pulmonary/Chest: Effort normal and breath sounds normal.  Abdominal: Soft.  Musculoskeletal: Normal range of motion.  Neurological: She is alert and oriented to person, place, and time. She has normal reflexes. Gait normal.  Skin: Skin is warm and dry.  Psychiatric: Mood, memory, affect and judgment normal.    Lab Results  Component Value Date   WBC 4.9 07/03/2013   HGB 13.2 07/03/2013   HCT 39.1 07/03/2013   PLT 216 07/03/2013   GLUCOSE 83 07/03/2013   CHOL 277* 06/23/2015   TRIG 65 06/23/2015   HDL 73 06/23/2015   LDLCALC 191* 06/23/2015   TSH 1.920 06/23/2015   INR 0.87 11/25/2010   HGBA1C * 11/27/2010    5.9 (NOTE)                                                                       According to the ADA Clinical Practice Recommendations for 2011, when HbA1c is used as a screening test:   >=6.5%   Diagnostic of Diabetes Mellitus           (if abnormal result  is confirmed)  5.7-6.4%   Increased risk of developing Diabetes Mellitus  References:Diagnosis and Classification of Diabetes Mellitus,Diabetes MOQH,4765,46(TKPTW 1):S62-S69 and Standards of Medical Care in         Diabetes - 2011,Diabetes Care,2011,34  (Suppl 1):S11-S61.    CMP     Component Value Date/Time   NA 139 07/03/2013 1205   NA 140 11/28/2010 0953   K 3.9 07/03/2013 1205   K 3.7 11/28/2010 0953   CL 104 07/03/2013 1205   CL 103 11/28/2010 0953   CO2 29 07/03/2013 1205   CO2 29 11/28/2010 0953   GLUCOSE 83 07/03/2013 1205   GLUCOSE 121* 11/28/2010 0953   BUN 22* 07/03/2013 1205   BUN 8 11/28/2010 0953   CREATININE 0.79 07/03/2013 1205   CREATININE 0.63 11/28/2010 0953    CALCIUM 9.4 07/03/2013 1205   CALCIUM 9.0 11/28/2010 0953   PROT 7.6 07/03/2013 1205   PROT 6.2 11/26/2010 0550   ALBUMIN 3.7 07/03/2013 1205   ALBUMIN 3.2* 11/26/2010 0550   AST 32 07/03/2013 1205   AST 25 11/26/2010 0550   ALT 37 07/03/2013 1205   ALT 17 11/26/2010 0550   ALKPHOS 79 07/03/2013 1205   ALKPHOS 41 11/26/2010 0550   BILITOT 0.3 07/03/2013 1205   BILITOT 0.2* 11/26/2010 0550   GFRNONAA >60 07/03/2013 1205   GFRNONAA >60 11/28/2010 0953   GFRAA >60 07/03/2013 1205   GFRAA  11/28/2010 0953    >60        The eGFR has been calculated using the MDRD equation. This calculation has not been validated in all clinical situations. eGFR's persistently <60 mL/min signify possible Chronic Kidney Disease.    Assessment and Plan :  1. Hypothyroidism, unspecified hypothyroidism type   2. Hyperlipidemia   3. Fibromyalgia Consider switching amitriptyline to Nortriptyline if taking a whole tablet of Amitriptyline does not help with this and sleep.  4. Depression  - DULoxetine (CYMBALTA) 60 MG capsule; Take 1 capsule (60 mg total) by  mouth daily.  Dispense: 90 capsule; Refill: 3  5. IBS (irritable bowel syndrome)  6. Headache, unspecified headache type  - amitriptyline (ELAVIL) 25 MG tablet; Take 1 tablet (25 mg total) by mouth at bedtime.  Dispense: 90 tablet; Refill: 3 I have done the exam and reviewed the above chart and it is accurate to the best of my knowledge.  Patient was seen and examined by Dr. Miguel Aschoff, and noted scribed by Webb Laws, [place] MD [facility] 12/04/2015 9:54 AM

## 2015-12-24 DIAGNOSIS — J0100 Acute maxillary sinusitis, unspecified: Secondary | ICD-10-CM | POA: Diagnosis not present

## 2016-04-02 ENCOUNTER — Other Ambulatory Visit: Payer: Self-pay | Admitting: Family Medicine

## 2016-06-10 ENCOUNTER — Ambulatory Visit: Payer: PPO | Admitting: Family Medicine

## 2016-06-17 ENCOUNTER — Ambulatory Visit: Payer: PPO | Admitting: Family Medicine

## 2016-06-18 ENCOUNTER — Encounter: Payer: Self-pay | Admitting: Family Medicine

## 2016-06-18 ENCOUNTER — Ambulatory Visit (INDEPENDENT_AMBULATORY_CARE_PROVIDER_SITE_OTHER): Payer: PPO | Admitting: Family Medicine

## 2016-06-18 VITALS — BP 98/62 | HR 88 | Temp 98.2°F | Resp 14 | Wt 129.0 lb

## 2016-06-18 DIAGNOSIS — Z23 Encounter for immunization: Secondary | ICD-10-CM

## 2016-06-18 DIAGNOSIS — M797 Fibromyalgia: Secondary | ICD-10-CM

## 2016-06-18 DIAGNOSIS — F329 Major depressive disorder, single episode, unspecified: Secondary | ICD-10-CM

## 2016-06-18 DIAGNOSIS — E039 Hypothyroidism, unspecified: Secondary | ICD-10-CM

## 2016-06-18 DIAGNOSIS — N393 Stress incontinence (female) (male): Secondary | ICD-10-CM | POA: Diagnosis not present

## 2016-06-18 DIAGNOSIS — F32A Depression, unspecified: Secondary | ICD-10-CM

## 2016-06-18 DIAGNOSIS — E785 Hyperlipidemia, unspecified: Secondary | ICD-10-CM

## 2016-06-18 MED ORDER — LEVOTHYROXINE SODIUM 25 MCG PO TABS: 25.0000 ug | ORAL_TABLET | Freq: Every day | ORAL | 12 refills | Status: DC

## 2016-06-18 NOTE — Progress Notes (Signed)
[name] Brown  MRN: [id number] DOB: [birth year]  Subjective:  HPI  Patient is here for 6 months follow up.  Hypothyroidism: she is taking Levothyroxine 25 mg daily. Lab Results  Component Value Date   TSH 1.920 06/23/2015   Hyperlipidemia: she is not on medication for this. She has never been on any medications for this. She has not been exercising as much as she should. Lab Results  Component Value Date   CHOL 277 (H) 06/23/2015   HDL 73 06/23/2015   LDLCALC 191 (H) 06/23/2015   TRIG 65 06/23/2015   CHOLHDL 3.4 11/25/2010   Depression: stable on medication.  Fibromyalgia: She has chronic muscle spasms, joint pain, stiffness every day. Amitriptyline, Cymbalta, Flexeril helps overall with her symptoms.  Last labs 06/23/15. Patient Active Problem List   Diagnosis Date Noted  . Hyperlipidemia 06/07/2015  . IBS (irritable bowel syndrome) 03/01/2015  . Depression 03/01/2015  . Fibromyalgia 03/01/2015  . Thyroid activity decreased 03/01/2015  . Pain in joint, shoulder region 01/11/2014  . Cervicalgia 01/11/2014  . History of fusion of cervical spine 01/11/2014  . Headache(784.0) 03/29/2013  . Chronic interstitial cystitis 11/05/2012    Past Medical History:  Diagnosis Date  . Anxiety   . Cervical spondylosis without myelopathy   . Cervicalgia 01/11/2014  . Degeneration of cervical intervertebral disc   . Endometriosis   . Headache(784.0)   . High cholesterol   . History of fusion of cervical spine 01/11/2014  . IC (interstitial cystitis)   . Myalgia and myositis, unspecified   . Osteoarthrosis, unspecified whether generalized or localized, unspecified site   . Other and unspecified hyperlipidemia   . Pain in joint, shoulder region 01/11/2014  . Pain in joint, upper arm   . Unspecified disorder of bladder     Social History   Social History  . Marital status: Married    Spouse name: Liliane Channel  . Number of children: 2  . Years of education: College   Occupational  History  . Not on file.   Social History Main Topics  . Smoking status: Never Smoker  . Smokeless tobacco: Never Used  . Alcohol use Yes     Comment: 1 every 2 weeks  . Drug use: No  . Sexual activity: Yes    Partners: Male    Birth control/ protection: Surgical     Comment: LAVH   Other Topics Concern  . 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   Patient has a Transport planner.   Patient drinks one can of Diet Dr. Malachi Bonds daily.   Patient has a college education.   Patient is right- handed.   Patient has two children and two step-children.    Outpatient Encounter Prescriptions as of 06/18/2016  Medication Sig Note  . amitriptyline (ELAVIL) 25 MG tablet Take 1 tablet (25 mg total) by mouth at bedtime.   . cyclobenzaprine (FLEXERIL) 10 MG tablet TAKE ONE TABLET BY MOUTH AT BEDTIME   . DULoxetine (CYMBALTA) 60 MG capsule Take 1 capsule (60 mg total) by mouth daily.   . fluticasone (FLONASE) 50 MCG/ACT nasal spray Place 2 sprays into the nose daily. 03/08/2015: Received from: [facility]: Place into the nose.  . lansoprazole (PREVACID) 30 MG capsule Take by mouth. 12/04/2015: Received from: [place]  . levothyroxine (SYNTHROID, LEVOTHROID) 25 MCG tablet Take 25 mcg by mouth daily  before breakfast.   . Magnesium 500 MG CAPS Take 1 capsule by mouth at bedtime.   Marland Kitchen OVER THE COUNTER MEDICATION Doterra   . [DISCONTINUED] fish oil-omega-3 fatty acids 1000 MG capsule Take 960 mg by mouth daily.   . [DISCONTINUED] levothyroxine (SYNTHROID, LEVOTHROID) 50 MCG tablet Take 1 tablet (50 mcg total) by mouth daily. (Patient taking differently: Take 25 mcg by mouth daily. )   . [DISCONTINUED] Multiple Vitamins-Minerals (MULTIVITAMIN PO) Take 1 tablet by mouth daily.    No facility-administered encounter medications on file as of 06/18/2016.     Allergies  Allergen Reactions  .  Codeine     hallucination  . Hydrocodone   . Levofloxacin Other (See Comments)  . Minocin [Minocycline]   . Nsaids     Other reaction(s): OTHER    Review of Systems  Constitutional: Positive for malaise/fatigue. Negative for chills and fever.  Respiratory: Negative.   Cardiovascular: Negative for chest pain, palpitations and leg swelling.  Gastrointestinal: Positive for abdominal pain (at times), constipation (at times), diarrhea and heartburn. Negative for nausea and vomiting.       Bloated, excessive gas at times  Musculoskeletal: Positive for back pain, joint pain, myalgias and neck pain.       Joint stiffness, muscle spasms.  Neurological: Positive for tingling (right hand-chronic) and weakness. Negative for dizziness and tremors.  Psychiatric/Behavioral: Positive for depression. The patient is nervous/anxious and has insomnia.        Symptoms are better on medications.   Objective:  BP 98/62   Pulse 88   Temp 98.2 F (36.8 C)   Resp 14   Wt 129 lb (58.5 kg)   LMP 01/28/1997   BMI 25.62 kg/m   Physical Exam  Constitutional: She is oriented to person, place, and time and well-developed, well-nourished, and in no distress.  HENT:  Head: Normocephalic and atraumatic.  Right Ear: External ear normal.  Left Ear: External ear normal.  Nose: Nose normal.  Eyes: Conjunctivae are normal. Pupils are equal, round, and reactive to light.  Neck: Normal range of motion. Neck supple.  Cardiovascular: Normal rate, regular rhythm, normal heart sounds and intact distal pulses.   No murmur heard. Pulmonary/Chest: Effort normal and breath sounds normal. No respiratory distress. She has no wheezes.  Abdominal: Soft.  Musculoskeletal: She exhibits no edema.  Neurological: She is alert and oriented to person, place, and time. Gait normal. Coordination normal.  Skin: Skin is warm and dry.  Psychiatric: Mood, memory, affect and judgment normal.    Assessment and Plan :  1.  Hypothyroidism, unspecified hypothyroidism type Check levels.  2. Hyperlipidemia Check levels  3. Fibromyalgia Chronic pain. Continue to follow.  4. Depression Stable on medication.  5. SI (stress incontinence), female  6. Need for influenza vaccination Administered today. - Flu Vaccine QUAD 36+ mos IM (Fluarix & Fluzone Quad PF  HPI, Exam and A&P transcribed under direction and in the presence of Miguel Aschoff, MD. I have done the exam and reviewed the chart and it is accurate to the best of my knowledge. Miguel Aschoff M.D. [facility]

## 2016-07-04 ENCOUNTER — Other Ambulatory Visit: Payer: Self-pay | Admitting: Obstetrics & Gynecology

## 2016-08-06 ENCOUNTER — Telehealth: Payer: Self-pay | Admitting: Obstetrics & Gynecology

## 2016-08-06 ENCOUNTER — Ambulatory Visit: Payer: Self-pay | Admitting: Obstetrics & Gynecology

## 2016-08-06 NOTE — Telephone Encounter (Signed)
Patient canceled her appointment for today. She is sick and vomiting. She will call back to reschedule. She is in annual recall.

## 2016-08-16 ENCOUNTER — Telehealth: Payer: Self-pay | Admitting: Family Medicine

## 2016-08-16 NOTE — Telephone Encounter (Signed)
Called Pt to schedule AWV with [facility] 3/21 - knb

## 2016-09-29 ENCOUNTER — Other Ambulatory Visit: Payer: Self-pay | Admitting: Family Medicine

## 2016-10-01 NOTE — Telephone Encounter (Signed)
Dr Rosanna Randy Do you want to authorize this? Thanks  [employer]

## 2016-10-02 DIAGNOSIS — D045 Carcinoma in situ of skin of trunk: Secondary | ICD-10-CM | POA: Diagnosis not present

## 2016-10-02 DIAGNOSIS — D485 Neoplasm of uncertain behavior of skin: Secondary | ICD-10-CM | POA: Diagnosis not present

## 2016-10-02 DIAGNOSIS — D2372 Other benign neoplasm of skin of left lower limb, including hip: Secondary | ICD-10-CM | POA: Diagnosis not present

## 2016-10-03 ENCOUNTER — Telehealth: Payer: Self-pay | Admitting: Family Medicine

## 2016-10-03 NOTE — Telephone Encounter (Signed)
Called Pt to schedule AWV with [facility] - knb °

## 2016-10-08 ENCOUNTER — Other Ambulatory Visit: Payer: Self-pay | Admitting: Obstetrics & Gynecology

## 2016-10-08 DIAGNOSIS — Z1231 Encounter for screening mammogram for malignant neoplasm of breast: Secondary | ICD-10-CM

## 2016-10-14 ENCOUNTER — Encounter: Payer: Self-pay | Admitting: Obstetrics & Gynecology

## 2016-10-14 ENCOUNTER — Ambulatory Visit (INDEPENDENT_AMBULATORY_CARE_PROVIDER_SITE_OTHER): Payer: PPO | Admitting: Obstetrics & Gynecology

## 2016-10-14 VITALS — BP 100/64 | HR 58 | Resp 12 | Ht 59.5 in | Wt 129.0 lb

## 2016-10-14 DIAGNOSIS — Z124 Encounter for screening for malignant neoplasm of cervix: Secondary | ICD-10-CM | POA: Diagnosis not present

## 2016-10-14 DIAGNOSIS — Z01419 Encounter for gynecological examination (general) (routine) without abnormal findings: Secondary | ICD-10-CM

## 2016-10-14 DIAGNOSIS — E784 Other hyperlipidemia: Secondary | ICD-10-CM | POA: Diagnosis not present

## 2016-10-14 DIAGNOSIS — Z0000 Encounter for general adult medical examination without abnormal findings: Secondary | ICD-10-CM

## 2016-10-14 DIAGNOSIS — Z23 Encounter for immunization: Secondary | ICD-10-CM

## 2016-10-14 DIAGNOSIS — E7849 Other hyperlipidemia: Secondary | ICD-10-CM

## 2016-10-14 LAB — POCT URINALYSIS DIPSTICK
Bilirubin, UA: NEGATIVE
Blood, UA: NEGATIVE
Glucose, UA: NEGATIVE
KETONES UA: NEGATIVE
Leukocytes, UA: NEGATIVE
Nitrite, UA: NEGATIVE
PROTEIN UA: NEGATIVE
Urobilinogen, UA: NEGATIVE
pH, UA: 6

## 2016-10-14 NOTE — Progress Notes (Signed)
61 y.o. CQ:[id number] MarriedCaucasianF here for annual exam.  Denies vaginal bleeding.  Doing well.    PCP:  Dr. Rosanna Randy.  Sees every 3-6 months Rheumatologist:  Donzetta Kohut, PA, at [facility]  Patient's last menstrual period was 01/28/1997.          Sexually active: No.   The current method of family planning is status post hysterectomy.    Exercising: No.  The patient does not participate in regular exercise at present. Smoker:  no  Health Maintenance: Pap:  8/11 negative  History of abnormal Pap:  yes MMG:  08/01/15 BIRADS 1 negative, scheduled in February Colonoscopy:  2013 normal, due later this year.  Has not received letter about scheduling BMD:   09/05/14 osteopenia, BMD follow up scheduled next week TDaP:  01/29/07   Pneumonia vaccine(s):  never Zostavax:   never Hep C testing: will provide order for pt to do with next alb work Screening Labs: PCP, Hb today: PCP, Urine today: normal    reports that she has never smoked. She has never used smokeless tobacco. She reports that she drinks alcohol. She reports that she does not use drugs.  Past Medical History:  Diagnosis Date  . Anxiety   . Cervical spondylosis without myelopathy   . Cervicalgia 01/11/2014  . Degeneration of cervical intervertebral disc   . Endometriosis   . Headache(784.0)   . High cholesterol   . History of fusion of cervical spine 01/11/2014  . IC (interstitial cystitis)   . Myalgia and myositis, unspecified   . Osteoarthrosis, unspecified whether generalized or localized, unspecified site   . Other and unspecified hyperlipidemia   . Pain in joint, shoulder region 01/11/2014  . Pain in joint, upper arm   . Squamous cell carcinoma    [facility]- place on chest  . Unspecified disorder of bladder     Past Surgical History:  Procedure Laterality Date  . ABDOMINAL HYSTERECTOMY  01/1996  . BLADDER SURGERY  07/2010  . ELBOW SURGERY Right 08/2011  . SPINE SURGERY  10/2010  . TONSILLECTOMY  1969     Current Outpatient Prescriptions  Medication Sig Dispense Refill  . amitriptyline (ELAVIL) 25 MG tablet Take 1 tablet (25 mg total) by mouth at bedtime. 90 tablet 3  . cyclobenzaprine (FLEXERIL) 10 MG tablet TAKE ONE TABLET BY MOUTH AT BEDTIME 30 tablet 5  . DULoxetine (CYMBALTA) 60 MG capsule Take 1 capsule (60 mg total) by mouth daily. 90 capsule 3  . lansoprazole (PREVACID) 30 MG capsule Take 20 mg by mouth.     . levothyroxine (SYNTHROID, LEVOTHROID) 25 MCG tablet Take 1 tablet (25 mcg total) by mouth daily before breakfast. 30 tablet 12  . Magnesium 500 MG CAPS Take 1 capsule by mouth at bedtime.    Marland Kitchen OVER THE COUNTER MEDICATION Doterra     No current facility-administered medications for this visit.     Family History  Problem Relation Age of Onset  . Osteoarthritis Mother   . Heart disease Father   . Osteoarthritis Maternal Grandfather     Rheumatoid  . Cancer Paternal Grandmother     tongue & throat  . Breast cancer      Aunt  . Breast cancer      Aunt    ROS:  Pertinent items are noted in HPI.  Otherwise, a comprehensive ROS was negative.  Exam:   BP 100/64 (BP Location: Right Arm, Patient Position: Sitting, Cuff Size: Normal)   Pulse (!) 58  Resp 12   Ht 4' 11.5" (1.511 m)   Wt 129 lb (58.5 kg)   LMP 01/28/1997   BMI 25.62 kg/m   Weight change: +5#   Height: 4' 11.5" (151.1 cm)  Ht Readings from Last 3 Encounters:  10/14/16 4' 11.5" (1.511 m)  07/08/14 4' 11.5" (1.511 m)  01/11/14 5' 0.25" (1.53 m)    General appearance: alert, cooperative and appears stated age Head: Normocephalic, without obvious abnormality, atraumatic Neck: no adenopathy, supple, symmetrical, trachea midline and thyroid normal to inspection and palpation Lungs: clear to auscultation bilaterally Breasts: normal appearance, no masses or tenderness Heart: regular rate and rhythm Abdomen: soft, non-tender; bowel sounds normal; no masses,  no organomegaly Extremities: extremities  normal, atraumatic, no cyanosis or edema Skin: Skin color, texture, turgor normal. No rashes or lesions Lymph nodes: Cervical, supraclavicular, and axillary nodes normal. No abnormal inguinal nodes palpated Neurologic: Grossly normal  Pelvic: External genitalia:  no lesions              Urethra:  normal appearing urethra with no masses, tenderness or lesions              Bartholins and Skenes: normal                 Vagina: normal appearing vagina with normal color and discharge, no lesions              Cervix: absent              Pap taken: No. Bimanual Exam:  Uterus:  uterus absent              Adnexa: no mass, fullness, tenderness               Rectovaginal: Confirms               Anus:  normal sphincter tone, no lesions  Chaperone was present for exam.  A:  Well Woman with normal exam Menopausal no HRT Fibromyalgia   IC Hypothyroidism  P:         Mammogram is scheduled.  Pt aware of yearly recommendations Pap obtained today Tdap given today Order for Vit D and Hep C antibody given to pt to do with lab work form PCP Order for [employer] today.  Pt aware needs repeat in 2-6 months (pt is unsure if she had chicken pox as a child but reviewed Up to Date recommendation of proceeding with vaccination and not testing for prior exposure) AEX 1 year or follow up prn

## 2016-10-15 DIAGNOSIS — H04129 Dry eye syndrome of unspecified lacrimal gland: Secondary | ICD-10-CM | POA: Diagnosis not present

## 2016-10-16 LAB — IPS PAP TEST WITH REFLEX TO HPV

## 2016-10-23 DIAGNOSIS — M8589 Other specified disorders of bone density and structure, multiple sites: Secondary | ICD-10-CM | POA: Diagnosis not present

## 2016-10-24 DIAGNOSIS — L538 Other specified erythematous conditions: Secondary | ICD-10-CM | POA: Diagnosis not present

## 2016-10-24 DIAGNOSIS — D045 Carcinoma in situ of skin of trunk: Secondary | ICD-10-CM | POA: Diagnosis not present

## 2016-10-24 DIAGNOSIS — L820 Inflamed seborrheic keratosis: Secondary | ICD-10-CM | POA: Diagnosis not present

## 2016-11-05 ENCOUNTER — Telehealth: Payer: Self-pay | Admitting: *Deleted

## 2016-11-05 NOTE — Telephone Encounter (Signed)
Patient notified of BMD results. Patient verbalized understanding.   Routing to provider for final review. Patient agreeable to disposition. Will close encounter.    

## 2016-11-06 ENCOUNTER — Ambulatory Visit
Admission: RE | Admit: 2016-11-06 | Discharge: 2016-11-06 | Disposition: A | Discharge status: Home / Self Care | Payer: PPO | Source: Ambulatory Visit | Attending: Obstetrics & Gynecology | Admitting: Obstetrics & Gynecology

## 2016-11-06 DIAGNOSIS — Z1231 Encounter for screening mammogram for malignant neoplasm of breast: Secondary | ICD-10-CM

## 2016-11-07 ENCOUNTER — Encounter: Payer: Self-pay | Admitting: Obstetrics & Gynecology

## 2016-11-20 ENCOUNTER — Encounter: Payer: Self-pay | Admitting: Obstetrics & Gynecology

## 2016-11-26 ENCOUNTER — Other Ambulatory Visit: Payer: Self-pay | Admitting: Family Medicine

## 2016-11-26 DIAGNOSIS — F329 Major depressive disorder, single episode, unspecified: Secondary | ICD-10-CM

## 2016-11-26 DIAGNOSIS — F32A Depression, unspecified: Secondary | ICD-10-CM

## 2016-11-26 DIAGNOSIS — M25519 Pain in unspecified shoulder: Secondary | ICD-10-CM

## 2016-11-26 DIAGNOSIS — M797 Fibromyalgia: Secondary | ICD-10-CM

## 2016-11-26 DIAGNOSIS — N393 Stress incontinence (female) (male): Secondary | ICD-10-CM

## 2016-11-26 DIAGNOSIS — R52 Pain, unspecified: Secondary | ICD-10-CM

## 2016-11-26 DIAGNOSIS — M542 Cervicalgia: Secondary | ICD-10-CM

## 2016-12-01 DIAGNOSIS — J0101 Acute recurrent maxillary sinusitis: Secondary | ICD-10-CM | POA: Diagnosis not present

## 2016-12-11 ENCOUNTER — Other Ambulatory Visit: Payer: Self-pay | Admitting: Obstetrics & Gynecology

## 2016-12-11 DIAGNOSIS — E785 Hyperlipidemia, unspecified: Secondary | ICD-10-CM | POA: Diagnosis not present

## 2016-12-11 DIAGNOSIS — E039 Hypothyroidism, unspecified: Secondary | ICD-10-CM | POA: Diagnosis not present

## 2016-12-11 DIAGNOSIS — Z205 Contact with and (suspected) exposure to viral hepatitis: Secondary | ICD-10-CM | POA: Diagnosis not present

## 2016-12-11 DIAGNOSIS — M797 Fibromyalgia: Secondary | ICD-10-CM | POA: Diagnosis not present

## 2016-12-11 DIAGNOSIS — M8580 Other specified disorders of bone density and structure, unspecified site: Secondary | ICD-10-CM | POA: Diagnosis not present

## 2016-12-12 LAB — COMPREHENSIVE METABOLIC PANEL
A/G RATIO: 1.5 (ref 1.2–2.2)
ALBUMIN: 4.3 g/dL (ref 3.6–4.8)
ALT: 22 IU/L (ref 0–32)
AST: 16 IU/L (ref 0–40)
Alkaline Phosphatase: 72 IU/L (ref 39–117)
BILIRUBIN TOTAL: 0.2 mg/dL (ref 0.0–1.2)
BUN / CREAT RATIO: 25 (ref 12–28)
BUN: 20 mg/dL (ref 8–27)
CALCIUM: 9.4 mg/dL (ref 8.7–10.3)
CHLORIDE: 99 mmol/L (ref 96–106)
CO2: 31 mmol/L — ABNORMAL HIGH (ref 18–29)
Creatinine, Ser: 0.8 mg/dL (ref 0.57–1.00)
GFR, EST AFRICAN AMERICAN: 93 mL/min/{1.73_m2} (ref >59)
GFR, EST NON AFRICAN AMERICAN: 80 mL/min/{1.73_m2} (ref >59)
GLUCOSE: 87 mg/dL (ref 65–99)
Globulin, Total: 2.9 g/dL (ref 1.5–4.5)
Potassium: 4.2 mmol/L (ref 3.5–5.2)
SODIUM: 141 mmol/L (ref 134–144)
TOTAL PROTEIN: 7.2 g/dL (ref 6.0–8.5)

## 2016-12-12 LAB — CBC WITH DIFFERENTIAL/PLATELET
BASOS: 1 %
Basophils Absolute: 0.1 10*3/uL (ref 0.0–0.2)
EOS (ABSOLUTE): 0.5 10*3/uL — ABNORMAL HIGH (ref 0.0–0.4)
Eos: 7 %
HEMOGLOBIN: 13.6 g/dL (ref 11.1–15.9)
Hematocrit: 41.6 % (ref 34.0–46.6)
IMMATURE GRANS (ABS): 0 10*3/uL (ref 0.0–0.1)
IMMATURE GRANULOCYTES: 0 %
LYMPHS: 38 %
Lymphocytes Absolute: 3.1 10*3/uL (ref 0.7–3.1)
MCH: 29.2 pg (ref 26.6–33.0)
MCHC: 32.7 g/dL (ref 31.5–35.7)
MCV: 90 fL (ref 79–97)
MONOCYTES: 6 %
Monocytes Absolute: 0.5 10*3/uL (ref 0.1–0.9)
NEUTROS ABS: 3.9 10*3/uL (ref 1.4–7.0)
Neutrophils: 48 %
Platelets: 291 10*3/uL (ref 150–379)
RBC: 4.65 x10E6/uL (ref 3.77–5.28)
RDW: 14.4 % (ref 12.3–15.4)
WBC: 8.1 10*3/uL (ref 3.4–10.8)

## 2016-12-12 LAB — LIPID PANEL WITH LDL/HDL RATIO
Cholesterol, Total: 275 mg/dL — ABNORMAL HIGH (ref 100–199)
HDL: 81 mg/dL (ref >39)
LDL CALC: 178 mg/dL — AB (ref 0–99)
LDl/HDL Ratio: 2.2 ratio units (ref 0.0–3.2)
Triglycerides: 81 mg/dL (ref 0–149)
VLDL CHOLESTEROL CAL: 16 mg/dL (ref 5–40)

## 2016-12-12 LAB — VITAMIN D 25 HYDROXY (VIT D DEFICIENCY, FRACTURES): Vit D, 25-Hydroxy: 39.4 ng/mL (ref 30.0–100.0)

## 2016-12-12 LAB — TSH: TSH: 2.68 u[IU]/mL (ref 0.450–4.500)

## 2016-12-12 LAB — HEPATITIS C ANTIBODY: Hep C Virus Ab: <0.1 s/co ratio (ref 0.0–0.9)

## 2016-12-18 ENCOUNTER — Encounter: Payer: Self-pay | Admitting: Family Medicine

## 2016-12-18 ENCOUNTER — Ambulatory Visit (INDEPENDENT_AMBULATORY_CARE_PROVIDER_SITE_OTHER): Payer: PPO | Admitting: Family Medicine

## 2016-12-18 VITALS — BP 108/60 | HR 84 | Temp 97.9°F | Resp 16 | Wt 128.0 lb

## 2016-12-18 DIAGNOSIS — F329 Major depressive disorder, single episode, unspecified: Secondary | ICD-10-CM

## 2016-12-18 DIAGNOSIS — F32A Depression, unspecified: Secondary | ICD-10-CM

## 2016-12-18 DIAGNOSIS — G4700 Insomnia, unspecified: Secondary | ICD-10-CM

## 2016-12-18 DIAGNOSIS — E7849 Other hyperlipidemia: Secondary | ICD-10-CM

## 2016-12-18 DIAGNOSIS — M797 Fibromyalgia: Secondary | ICD-10-CM | POA: Diagnosis not present

## 2016-12-18 DIAGNOSIS — E039 Hypothyroidism, unspecified: Secondary | ICD-10-CM

## 2016-12-18 DIAGNOSIS — E784 Other hyperlipidemia: Secondary | ICD-10-CM | POA: Diagnosis not present

## 2016-12-18 MED ORDER — TRAZODONE HCL 50 MG PO TABS: 50.0000 mg | ORAL_TABLET | Freq: Every evening | ORAL | 3 refills | Status: DC | PRN

## 2016-12-18 MED ORDER — LEVOTHYROXINE SODIUM 25 MCG PO TABS: 25.0000 ug | ORAL_TABLET | Freq: Every day | ORAL | 12 refills | Status: DC

## 2016-12-18 NOTE — Patient Instructions (Signed)
Try Metamucil for cholesterol.

## 2016-12-18 NOTE — Progress Notes (Signed)
Subjective:  HPI Pt is here for a 6 month follow up of her chronic problems. She reports that she has a lot going on in her home life right now. She has her son and grandson living with them right now since he sold his house and separated from his wife. She reports that this is is a big stressor for her right now and she is constantly doing and going somewhere. She is tired and is not sleeping well. She has a hard time falling asleep because she can not take her mind off things. Having fibromyalgia makes this hard to deal with and do the things she needs to do. She reports that she does not feel depressed but stressed.   Depression screen PHQ 2/9 12/18/2016  Decreased Interest 1  Down, Depressed, Hopeless 1  PHQ - 2 Score 2  Altered sleeping 2  Tired, decreased energy 3  Change in appetite 0  Feeling bad or failure about yourself  0  Trouble concentrating 0  Moving slowly or fidgety/restless 2  Suicidal thoughts 0  PHQ-9 Score 9     Prior to Admission medications   Medication Sig Start Date End Date Taking? Authorizing Provider  amitriptyline (ELAVIL) 25 MG tablet Take 1 tablet (25 mg total) by mouth at bedtime. 12/04/15   Lean Jaeger Maceo Pro., MD  cyclobenzaprine (FLEXERIL) 10 MG tablet TAKE ONE TABLET BY MOUTH AT BEDTIME 10/01/16   Jerrol Banana., MD  DULoxetine (CYMBALTA) 60 MG capsule TAKE ONE CAPSULE BY MOUTH ONCE DAILY 11/26/16   Jerrol Banana., MD  lansoprazole (PREVACID) 30 MG capsule Take 20 mg by mouth.     Historical Provider, MD  levothyroxine (SYNTHROID, LEVOTHROID) 25 MCG tablet Take 1 tablet (25 mcg total) by mouth daily before breakfast. 06/18/16   Jerrol Banana., MD  Magnesium 500 MG CAPS Take 1 capsule by mouth at bedtime.    Historical Provider, MD  [place]    Historical Provider, MD    Patient Active Problem List   Diagnosis Date Noted  . Hyperlipidemia 06/07/2015  . IBS (irritable bowel syndrome) 03/01/2015  .  Depression 03/01/2015  . Fibromyalgia 03/01/2015  . Thyroid activity decreased 03/01/2015  . Pain in joint, shoulder region 01/11/2014  . Cervicalgia 01/11/2014  . History of fusion of cervical spine 01/11/2014  . Headache(784.0) 03/29/2013  . Chronic interstitial cystitis 11/05/2012    Past Medical History:  Diagnosis Date  . Anxiety   . Cervical spondylosis without myelopathy   . Cervicalgia 01/11/2014  . Degeneration of cervical intervertebral disc   . Endometriosis   . Headache(784.0)   . High cholesterol   . History of fusion of cervical spine 01/11/2014  . IC (interstitial cystitis)   . Myalgia and myositis, unspecified   . Osteoarthrosis, unspecified whether generalized or localized, unspecified site   . Other and unspecified hyperlipidemia   . Pain in joint, shoulder region 01/11/2014  . Pain in joint, upper arm   . Squamous cell carcinoma    [facility]- place on chest  . Unspecified disorder of bladder     Social History   Social History  . Marital status: Married    Spouse name: Liliane Channel  . Number of children: 2  . Years of education: College   Occupational History  . Not on file.   Social History Main Topics  . Smoking status: Never Smoker  . Smokeless tobacco: Never Used  . Alcohol use  Yes     Comment: 1 every 2 weeks  . Drug use: No  . Sexual activity: Yes    Partners: Male    Birth control/ protection: Surgical     Comment: LAVH   Other Topics Concern  . 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   Patient has a Transport planner.   Patient drinks one can of Diet Dr. Malachi Bonds daily.   Patient has a college education.   Patient is right- handed.   Patient has two children and two step-children.    Allergies  Allergen Reactions  . Codeine     hallucination  . Hydrocodone   . Levofloxacin Other (See Comments)  . Minocin [Minocycline]   . Nsaids     Other reaction(s):  OTHER    Review of Systems  Constitutional: Positive for malaise/fatigue.  HENT: Negative.   Eyes: Negative.   Respiratory: Negative.   Cardiovascular: Negative.   Gastrointestinal: Negative.   Genitourinary: Negative.   Musculoskeletal: Negative.   Skin: Negative.   Neurological: Negative.   Endo/Heme/Allergies: Negative.   Psychiatric/Behavioral: The patient is nervous/anxious and has insomnia.     Immunization History  Administered Date(s) Administered  . Influenza,inj,Quad PF,36+ Mos 06/07/2015, 06/18/2016  . Tdap 01/29/2007, 10/14/2016    Objective:  BP 108/60 (BP Location: Left Arm, Patient Position: Sitting, Cuff Size: Normal)   Pulse 84   Temp 97.9 F (36.6 C) (Oral)   Resp 16   Wt 128 lb (58.1 kg)   LMP 01/28/1997   BMI 25.42 kg/m   Physical Exam  Constitutional: She is oriented to person, place, and time and well-developed, well-nourished, and in no distress.  HENT:  Head: Normocephalic and atraumatic.  Eyes: Conjunctivae and EOM are normal. Pupils are equal, round, and reactive to light.  Neck: Normal range of motion. Neck supple.  Cardiovascular: Normal rate, regular rhythm, normal heart sounds and intact distal pulses.   Pulmonary/Chest: Effort normal and breath sounds normal.  Abdominal: Soft.  Musculoskeletal: Normal range of motion.  Neurological: She is alert and oriented to person, place, and time. She has normal reflexes. Gait normal. GCS score is 15.  Skin: Skin is warm and dry.  Psychiatric: Mood, memory, affect and judgment normal.    Lab Results  Component Value Date   WBC 8.1 12/11/2016   HGB 13.2 07/03/2013   HCT 41.6 12/11/2016   PLT 291 12/11/2016   GLUCOSE 87 12/11/2016   CHOL 275 (H) 12/11/2016   TRIG 81 12/11/2016   HDL 81 12/11/2016   LDLCALC 178 (H) 12/11/2016   TSH 2.680 12/11/2016   INR 0.87 11/25/2010   HGBA1C (H) 11/27/2010    5.9 (NOTE)                                                                       According  to the ADA Clinical Practice Recommendations for 2011, when HbA1c is used as a screening test:   >=6.5%   Diagnostic of Diabetes Mellitus           (if abnormal result  is confirmed)  5.7-6.4%   Increased risk of developing Diabetes Mellitus  References:Diagnosis and  Classification of Diabetes Mellitus,Diabetes LPFX,9024,09(BDZHG 1):S62-S69 and Standards of Medical Care in         Diabetes - 2011,Diabetes DJME,2683,41  (Suppl 1):S11-S61.    CMP     Component Value Date/Time   NA 141 12/11/2016 1130   NA 139 07/03/2013 1205   K 4.2 12/11/2016 1130   K 3.9 07/03/2013 1205   CL 99 12/11/2016 1130   CL 104 07/03/2013 1205   CO2 31 (H) 12/11/2016 1130   CO2 29 07/03/2013 1205   GLUCOSE 87 12/11/2016 1130   GLUCOSE 83 07/03/2013 1205   BUN 20 12/11/2016 1130   BUN 22 (H) 07/03/2013 1205   CREATININE 0.80 12/11/2016 1130   CREATININE 0.79 07/03/2013 1205   CALCIUM 9.4 12/11/2016 1130   CALCIUM 9.4 07/03/2013 1205   PROT 7.2 12/11/2016 1130   PROT 7.6 07/03/2013 1205   ALBUMIN 4.3 12/11/2016 1130   ALBUMIN 3.7 07/03/2013 1205   AST 16 12/11/2016 1130   AST 32 07/03/2013 1205   ALT 22 12/11/2016 1130   ALT 37 07/03/2013 1205   ALKPHOS 72 12/11/2016 1130   ALKPHOS 79 07/03/2013 1205   BILITOT 0.2 12/11/2016 1130   BILITOT 0.3 07/03/2013 1205   GFRNONAA 80 12/11/2016 1130   GFRNONAA >60 07/03/2013 1205   GFRAA 93 12/11/2016 1130   GFRAA >60 07/03/2013 1205    Assessment and Plan :  1. Other hyperlipidemia Lipids are better than last year but still moderately elevated Pt will work on diet and exercise and start taking metamucil. Follow up with labs in 3 months.  2. Fibromyalgia Stable.  3. Depression, unspecified depression type   4. Hypothyroidism, unspecified type  - levothyroxine (SYNTHROID, LEVOTHROID) 25 MCG tablet; Take 1 tablet (25 mcg total) by mouth daily before breakfast.  Dispense: 30 tablet; Refill: 12  5. Insomnia, unspecified type Follow up in 3 months.  -  traZODone (DESYREL) 50 MG tablet; Take 1 tablet (50 mg total) by mouth at bedtime as needed for sleep.  Dispense: 30 tablet; Refill: 3   HPI, Exam, and A&P Transcribed under the direction and in the presence of Yahye Siebert L. Cranford Mon, MD  Electronically Signed: Katina Dung, CMA I have done the exam and reviewed the above chart and it is accurate to the best of my knowledge. Development worker, community has been used in this note in any air is in the dictation or transcription are unintentional.  [facility] 12/18/2016 11:40 AM

## 2017-02-11 ENCOUNTER — Telehealth: Payer: Self-pay | Admitting: Family Medicine

## 2017-02-19 DIAGNOSIS — Z85828 Personal history of other malignant neoplasm of skin: Secondary | ICD-10-CM | POA: Diagnosis not present

## 2017-02-19 DIAGNOSIS — Z08 Encounter for follow-up examination after completed treatment for malignant neoplasm: Secondary | ICD-10-CM | POA: Diagnosis not present

## 2017-02-19 DIAGNOSIS — L448 Other specified papulosquamous disorders: Secondary | ICD-10-CM | POA: Diagnosis not present

## 2017-02-19 DIAGNOSIS — D225 Melanocytic nevi of trunk: Secondary | ICD-10-CM | POA: Diagnosis not present

## 2017-03-25 ENCOUNTER — Ambulatory Visit: Payer: PPO | Admitting: Family Medicine

## 2017-03-28 ENCOUNTER — Other Ambulatory Visit: Payer: Self-pay | Admitting: Family Medicine

## 2017-05-30 ENCOUNTER — Other Ambulatory Visit: Payer: Self-pay | Admitting: Family Medicine

## 2017-05-30 DIAGNOSIS — R52 Pain, unspecified: Secondary | ICD-10-CM

## 2017-05-30 DIAGNOSIS — M542 Cervicalgia: Secondary | ICD-10-CM

## 2017-05-30 DIAGNOSIS — M797 Fibromyalgia: Secondary | ICD-10-CM

## 2017-05-30 DIAGNOSIS — M25519 Pain in unspecified shoulder: Secondary | ICD-10-CM

## 2017-05-30 DIAGNOSIS — N393 Stress incontinence (female) (male): Secondary | ICD-10-CM

## 2017-05-30 DIAGNOSIS — F329 Major depressive disorder, single episode, unspecified: Secondary | ICD-10-CM

## 2017-05-30 DIAGNOSIS — F32A Depression, unspecified: Secondary | ICD-10-CM

## 2017-06-10 ENCOUNTER — Telehealth: Payer: Self-pay | Admitting: Family Medicine

## 2017-06-10 DIAGNOSIS — R1030 Lower abdominal pain, unspecified: Secondary | ICD-10-CM | POA: Diagnosis not present

## 2017-06-10 DIAGNOSIS — R143 Flatulence: Secondary | ICD-10-CM | POA: Diagnosis not present

## 2017-06-10 DIAGNOSIS — R140 Abdominal distension (gaseous): Secondary | ICD-10-CM | POA: Diagnosis not present

## 2017-06-10 DIAGNOSIS — K5900 Constipation, unspecified: Secondary | ICD-10-CM | POA: Diagnosis not present

## 2017-06-10 DIAGNOSIS — R198 Other specified symptoms and signs involving the digestive system and abdomen: Secondary | ICD-10-CM | POA: Diagnosis not present

## 2017-07-01 ENCOUNTER — Ambulatory Visit (INDEPENDENT_AMBULATORY_CARE_PROVIDER_SITE_OTHER): Payer: PPO | Admitting: Family Medicine

## 2017-07-01 VITALS — BP 100/62 | HR 74 | Temp 98.0°F | Resp 16 | Wt 132.0 lb

## 2017-07-01 DIAGNOSIS — Z23 Encounter for immunization: Secondary | ICD-10-CM

## 2017-07-01 DIAGNOSIS — E7849 Other hyperlipidemia: Secondary | ICD-10-CM

## 2017-07-01 NOTE — Progress Notes (Signed)
[name] Brown  MRN: [id number] DOB: Apr 01, 1956  Subjective:  HPI   The patient is a 61 year old female who presents for follow up of her lipids.  She was last seen on 12/18/16 and at that time they were better than previously but still elevated.  She was instructed to start Metamucil and then be seen in 3 months.    She was taking the Metamucil and over the last sever weeks she became constipated and needed to use Miralax.  She did not know if she could take both together so she has not been taking the Metamucil for a few weeks.  She was also seen at that time for insomnia and started on Trazadone.  The patient took 1/2 a Trazadone one evening and states it kept her awake all night and made her feel very jittery and feel bad. Overall she feels well.  Patient Active Problem List   Diagnosis Date Noted  . Hyperlipidemia 06/07/2015  . IBS (irritable bowel syndrome) 03/01/2015  . Depression 03/01/2015  . Fibromyalgia 03/01/2015  . Thyroid activity decreased 03/01/2015  . Pain in joint, shoulder region 01/11/2014  . Cervicalgia 01/11/2014  . History of fusion of cervical spine 01/11/2014  . Headache(784.0) 03/29/2013  . Chronic interstitial cystitis 11/05/2012    Past Medical History:  Diagnosis Date  . Anxiety   . Cervical spondylosis without myelopathy   . Cervicalgia 01/11/2014  . Degeneration of cervical intervertebral disc   . Endometriosis   . Headache(784.0)   . High cholesterol   . History of fusion of cervical spine 01/11/2014  . IC (interstitial cystitis)   . Myalgia and myositis, unspecified   . Osteoarthrosis, unspecified whether generalized or localized, unspecified site   . Other and unspecified hyperlipidemia   . Pain in joint, shoulder region 01/11/2014  . Pain in joint, upper arm   . Squamous cell carcinoma    [facility]- place on chest  . Unspecified disorder of bladder     Social History   Social History  . Marital status: Married    Spouse  name: Liliane Channel  . Number of children: 2  . Years of education: College   Occupational History  . Not on file.   Social History Main Topics  . Smoking status: Never Smoker  . Smokeless tobacco: Never Used  . Alcohol use Yes     Comment: 1 every 2 weeks  . Drug use: No  . Sexual activity: Yes    Partners: Male    Birth control/ protection: Surgical     Comment: LAVH   Other Topics Concern  . 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   Patient has a Transport planner.   Patient drinks one can of Diet Dr. Malachi Bonds daily.   Patient has a college education.   Patient is right- handed.   Patient has two children and two step-children.    Outpatient Encounter Prescriptions as of 07/01/2017  Medication Sig Note  . amitriptyline (ELAVIL) 25 MG tablet Take 1 tablet (25 mg total) by mouth at bedtime.   . cyclobenzaprine (FLEXERIL) 10 MG tablet TAKE ONE TABLET BY MOUTH AT BEDTIME   . DULoxetine (CYMBALTA) 60 MG capsule TAKE 1 CAPSULE BY MOUTH ONCE DAILY   . lansoprazole (PREVACID) 30 MG capsule Take 20 mg by mouth.  12/04/2015: Received from: [facility]  . levothyroxine (SYNTHROID, LEVOTHROID) 25 MCG tablet  Take 1 tablet (25 mcg total) by mouth daily before breakfast.   . Magnesium 500 MG CAPS Take 1 capsule by mouth at bedtime.   Marland Kitchen OVER THE COUNTER MEDICATION Doterra   . traZODone (DESYREL) 50 MG tablet Take 1 tablet (50 mg total) by mouth at bedtime as needed for sleep. (Patient not taking: Reported on 07/01/2017)    No facility-administered encounter medications on file as of 07/01/2017.     Allergies  Allergen Reactions  . Codeine     hallucination  . Hydrocodone   . Levofloxacin Other (See Comments)  . Minocin [Minocycline]   . Nsaids Other (See Comments)    Other reaction(s): OTHER Other reaction(s): OTHER    Review of Systems  Constitutional: Positive for malaise/fatigue (chronic and  unchanged). Negative for fever.  Respiratory: Negative for cough, shortness of breath and wheezing.   Cardiovascular: Negative for chest pain, palpitations, orthopnea, claudication and leg swelling.  Neurological: Negative for weakness.  Psychiatric/Behavioral: Negative for depression, hallucinations, memory loss, substance abuse and suicidal ideas. The patient is nervous/anxious and has insomnia.     Objective:  BP 100/62 (BP Location: Right Arm, Patient Position: Sitting, Cuff Size: Normal)   Pulse 74   Temp 98 F (36.7 C) (Oral)   Resp 16   Wt 132 lb (59.9 kg)   LMP 01/28/1997   BMI 26.21 kg/m   Physical Exam  Constitutional: She is oriented to person, place, and time and well-developed, well-nourished, and in no distress.  HENT:  Head: Normocephalic and atraumatic.  Eyes: Pupils are equal, round, and reactive to light. Conjunctivae are normal.  Neck: Normal range of motion. No thyromegaly present.  Cardiovascular: Normal rate, regular rhythm and normal heart sounds.   Pulmonary/Chest: Effort normal and breath sounds normal.  Abdominal: Soft.  Neurological: She is alert and oriented to person, place, and time.  Skin: Skin is warm and dry.  Psychiatric: Mood, memory, affect and judgment normal.    Assessment and Plan :  1. Need for influenza vaccination  - Flu Vaccine QUAD 6+ mos PF IM (Fluarix Quad PF)  2. Other hyperlipidemia  - Lipid panel 3.Insomnia 4.MDD/Mild In remission.  I have done the exam and reviewed the chart and it is accurate to the best of my knowledge. Development worker, community has been used and  any errors in dictation or transcription are unintentional. Miguel Aschoff M.D. [facility]

## 2017-07-01 NOTE — Patient Instructions (Signed)
Use Melatonin if needed nightly, Benadryl if it is just for 1-2 nights per week.  Take Metamucil in the morning and Miralax at night.   Have labs done after you have been back on the Metamucil for at least 3 weeks

## 2017-07-02 NOTE — Telephone Encounter (Signed)
Has things going on now.  Will schedule visit later.

## 2017-07-24 ENCOUNTER — Telehealth: Payer: Self-pay | Admitting: Family Medicine

## 2017-07-29 DIAGNOSIS — R1013 Epigastric pain: Secondary | ICD-10-CM | POA: Diagnosis not present

## 2017-07-29 DIAGNOSIS — R1012 Left upper quadrant pain: Secondary | ICD-10-CM | POA: Diagnosis not present

## 2017-07-30 DIAGNOSIS — J019 Acute sinusitis, unspecified: Secondary | ICD-10-CM | POA: Diagnosis not present

## 2017-07-30 DIAGNOSIS — R109 Unspecified abdominal pain: Secondary | ICD-10-CM | POA: Diagnosis not present

## 2017-07-30 DIAGNOSIS — R1013 Epigastric pain: Secondary | ICD-10-CM | POA: Diagnosis not present

## 2017-08-12 DIAGNOSIS — Q433 Congenital malformations of intestinal fixation: Secondary | ICD-10-CM | POA: Diagnosis not present

## 2017-08-12 DIAGNOSIS — R109 Unspecified abdominal pain: Secondary | ICD-10-CM | POA: Diagnosis not present

## 2017-08-12 DIAGNOSIS — Z1211 Encounter for screening for malignant neoplasm of colon: Secondary | ICD-10-CM | POA: Diagnosis not present

## 2017-08-12 DIAGNOSIS — K219 Gastro-esophageal reflux disease without esophagitis: Secondary | ICD-10-CM | POA: Diagnosis not present

## 2017-08-12 LAB — HM COLONOSCOPY

## 2017-09-18 NOTE — Telephone Encounter (Signed)
Duplicate

## 2017-09-29 ENCOUNTER — Other Ambulatory Visit: Payer: Self-pay | Admitting: Family Medicine

## 2017-09-29 DIAGNOSIS — R52 Pain, unspecified: Secondary | ICD-10-CM

## 2017-09-29 DIAGNOSIS — R519 Headache, unspecified: Secondary | ICD-10-CM

## 2017-09-29 DIAGNOSIS — M797 Fibromyalgia: Secondary | ICD-10-CM

## 2017-09-29 DIAGNOSIS — R51 Headache: Principal | ICD-10-CM

## 2017-10-27 ENCOUNTER — Other Ambulatory Visit: Payer: Self-pay | Admitting: Obstetrics & Gynecology

## 2017-10-27 DIAGNOSIS — Z139 Encounter for screening, unspecified: Secondary | ICD-10-CM

## 2017-11-05 DIAGNOSIS — J309 Allergic rhinitis, unspecified: Secondary | ICD-10-CM | POA: Diagnosis not present

## 2017-11-05 DIAGNOSIS — J324 Chronic pansinusitis: Secondary | ICD-10-CM | POA: Diagnosis not present

## 2017-11-07 ENCOUNTER — Ambulatory Visit (INDEPENDENT_AMBULATORY_CARE_PROVIDER_SITE_OTHER): Payer: PPO | Admitting: Podiatry

## 2017-11-07 ENCOUNTER — Encounter: Payer: Self-pay | Admitting: Podiatry

## 2017-11-07 DIAGNOSIS — B351 Tinea unguium: Secondary | ICD-10-CM

## 2017-11-07 DIAGNOSIS — J301 Allergic rhinitis due to pollen: Secondary | ICD-10-CM | POA: Diagnosis not present

## 2017-11-07 NOTE — Progress Notes (Signed)
   Subjective:    Patient ID: [name] Brown, female    DOB: [birth year], 63 y.o.   MRN: [id number]  HPI    Review of Systems  All other systems reviewed and are negative.      Objective:   Physical Exam        Assessment & Plan:

## 2017-11-09 NOTE — Progress Notes (Signed)
Subjective:   Patient ID: [name] Brown, female   DOB: 62 y.o.   MRN: [id number]   HPI patient presents with  thick nail fourth right that she is concerned about with no history of significant discomfort and difficulty with cutting at times   Review of Systems  All other systems reviewed and are negative.       Objective:  Physical Exam  Constitutional: She appears well-developed and well-nourished.  Cardiovascular: Intact distal pulses.  Pulmonary/Chest: Effort normal.  Musculoskeletal: Normal range of motion.  Neurological: She is alert.  Skin: Skin is warm.  Nursing note and vitals reviewed.   Neurovascular status intact muscle strength adequate range of motion within normal limits with patient found to have a discolored fourth nail right that is local with no looseness of the nail and no other issue.  Patient has had several other nails removed by me in the past and those of all done well but this 1 is a problem for     Assessment:  Probable trauma to the fourth nailbed right with chronic mycotic fungal infection     Plan:  H&P condition reviewed and recommended that she consider an aggressive conservative approach of pulse antifungal therapy along with laser therapy and topical.  She working to try topical first and if it has not grown out in the next 4-6 months consider a different approach

## 2017-11-10 DIAGNOSIS — J029 Acute pharyngitis, unspecified: Secondary | ICD-10-CM | POA: Diagnosis not present

## 2017-11-10 DIAGNOSIS — Z20818 Contact with and (suspected) exposure to other bacterial communicable diseases: Secondary | ICD-10-CM | POA: Diagnosis not present

## 2017-11-19 ENCOUNTER — Ambulatory Visit
Admission: RE | Admit: 2017-11-19 | Discharge: 2017-11-19 | Disposition: A | Discharge status: Home / Self Care | Payer: PPO | Source: Ambulatory Visit | Attending: Obstetrics & Gynecology | Admitting: Obstetrics & Gynecology

## 2017-11-19 DIAGNOSIS — Z139 Encounter for screening, unspecified: Secondary | ICD-10-CM

## 2017-11-19 DIAGNOSIS — Z1231 Encounter for screening mammogram for malignant neoplasm of breast: Secondary | ICD-10-CM | POA: Diagnosis not present

## 2017-11-26 ENCOUNTER — Other Ambulatory Visit: Payer: Self-pay | Admitting: Family Medicine

## 2017-11-26 DIAGNOSIS — J309 Allergic rhinitis, unspecified: Secondary | ICD-10-CM | POA: Diagnosis not present

## 2017-11-26 DIAGNOSIS — F329 Major depressive disorder, single episode, unspecified: Secondary | ICD-10-CM

## 2017-11-26 DIAGNOSIS — M542 Cervicalgia: Secondary | ICD-10-CM

## 2017-11-26 DIAGNOSIS — F32A Depression, unspecified: Secondary | ICD-10-CM

## 2017-11-26 DIAGNOSIS — M797 Fibromyalgia: Secondary | ICD-10-CM

## 2017-11-26 DIAGNOSIS — N393 Stress incontinence (female) (male): Secondary | ICD-10-CM

## 2017-11-26 DIAGNOSIS — M25519 Pain in unspecified shoulder: Secondary | ICD-10-CM

## 2017-11-26 DIAGNOSIS — R52 Pain, unspecified: Secondary | ICD-10-CM

## 2017-12-23 ENCOUNTER — Telehealth: Payer: Self-pay | Admitting: Family Medicine

## 2017-12-23 DIAGNOSIS — E7849 Other hyperlipidemia: Secondary | ICD-10-CM

## 2017-12-23 NOTE — Telephone Encounter (Signed)
Pt needs an order to have her chol and lipids tested.  Her order is out of date per patient.  Pt's call back is [phone]  Thanks teri

## 2017-12-30 DIAGNOSIS — E7849 Other hyperlipidemia: Secondary | ICD-10-CM | POA: Diagnosis not present

## 2017-12-31 LAB — LIPID PANEL WITH LDL/HDL RATIO
Cholesterol, Total: 244 mg/dL — ABNORMAL HIGH (ref 100–199)
HDL: 71 mg/dL (ref >39)
LDL Calculated: 155 mg/dL — ABNORMAL HIGH (ref 0–99)
LDL/HDL RATIO: 2.2 ratio (ref 0.0–3.2)
TRIGLYCERIDES: 90 mg/dL (ref 0–149)
VLDL CHOLESTEROL CAL: 18 mg/dL (ref 5–40)

## 2018-01-01 NOTE — Telephone Encounter (Signed)
complete

## 2018-01-06 ENCOUNTER — Other Ambulatory Visit: Payer: Self-pay

## 2018-01-06 ENCOUNTER — Ambulatory Visit (INDEPENDENT_AMBULATORY_CARE_PROVIDER_SITE_OTHER): Payer: PPO | Admitting: Family Medicine

## 2018-01-06 VITALS — BP 104/78 | HR 98 | Temp 98.3°F | Resp 16 | Wt 137.0 lb

## 2018-01-06 DIAGNOSIS — M5137 Other intervertebral disc degeneration, lumbosacral region: Secondary | ICD-10-CM | POA: Diagnosis not present

## 2018-01-06 DIAGNOSIS — M5030 Other cervical disc degeneration, unspecified cervical region: Secondary | ICD-10-CM | POA: Diagnosis not present

## 2018-01-06 DIAGNOSIS — F325 Major depressive disorder, single episode, in full remission: Secondary | ICD-10-CM | POA: Diagnosis not present

## 2018-01-06 DIAGNOSIS — R5383 Other fatigue: Secondary | ICD-10-CM

## 2018-01-06 DIAGNOSIS — E7849 Other hyperlipidemia: Secondary | ICD-10-CM

## 2018-01-06 DIAGNOSIS — E039 Hypothyroidism, unspecified: Secondary | ICD-10-CM | POA: Diagnosis not present

## 2018-01-06 DIAGNOSIS — F329 Major depressive disorder, single episode, unspecified: Secondary | ICD-10-CM | POA: Diagnosis not present

## 2018-01-06 DIAGNOSIS — F32A Depression, unspecified: Secondary | ICD-10-CM

## 2018-01-06 MED ORDER — EZETIMIBE 10 MG PO TABS: 10.0000 mg | ORAL_TABLET | Freq: Every day | ORAL | 3 refills | Status: DC

## 2018-01-06 NOTE — Progress Notes (Signed)
[name] Brown  MRN: [id number] DOB: [birth year]  Subjective:  HPI   The patient is a 62 year old female who presents for 6 month follow up of chronic health.  She was last seen on 07/01/17.  Depression-in remission on last visit.  PHQ9 score is 4.  Insomnia-Patient states this is ok.  She is not taking the Trazodone due to side effects.  She still has trouble about 2-3 nights a week.  She has more trouble falling asleep.  When she wakes during the night she can usually go back to sleep.  Patient Active Problem List   Diagnosis Date Noted  . Hyperlipidemia 06/07/2015  . IBS (irritable bowel syndrome) 03/01/2015  . Depression 03/01/2015  . Fibromyalgia 03/01/2015  . Thyroid activity decreased 03/01/2015  . Pain in joint, shoulder region 01/11/2014  . Cervicalgia 01/11/2014  . History of fusion of cervical spine 01/11/2014  . Headache(784.0) 03/29/2013  . Chronic interstitial cystitis 11/05/2012    Past Medical History:  Diagnosis Date  . Anxiety   . Cervical spondylosis without myelopathy   . Cervicalgia 01/11/2014  . Degeneration of cervical intervertebral disc   . Endometriosis   . Headache(784.0)   . High cholesterol   . History of fusion of cervical spine 01/11/2014  . IC (interstitial cystitis)   . Myalgia and myositis, unspecified   . Osteoarthrosis, unspecified whether generalized or localized, unspecified site   . Other and unspecified hyperlipidemia   . Pain in joint, shoulder region 01/11/2014  . Pain in joint, upper arm   . Squamous cell carcinoma    [facility]- place on chest  . Unspecified disorder of bladder     Social History   Socioeconomic History  . Marital status: Married    Spouse name: Liliane Channel  . Number of children: 2  . Years of education: College  . Highest education level: Not on file  Occupational History  . Not on file  Social Needs  . Financial resource strain: Not on file  . Food insecurity:    Worry: Not on file   Inability: Not on file  . Transportation needs:    Medical: Not on file    Non-medical: Not on file  Tobacco Use  . Smoking status: Never Smoker  . Smokeless tobacco: Never Used  Substance and Sexual Activity  . Alcohol use: Yes    Comment: 1 every 2 weeks  . Drug use: No  . Sexual activity: Yes    Partners: Male    Birth control/protection: Surgical    Comment: LAVH  Lifestyle  . Physical activity:    Days per week: Not on file    Minutes per session: Not on file  . Stress: Not on file  Relationships  . Social connections:    Talks on phone: Not on file    Gets together: Not on file    Attends religious service: Not on file    Active member of club or organization: Not on file    Attends meetings of clubs or organizations: Not on file    Relationship status: Not on file  . Intimate partner violence:    Fear of current or ex partner: Not on file    Emotionally abused: Not on file    Physically abused: Not on file    Forced sexual activity: Not on file  Other Topics Concern  . Not on file  Social History Narrative   Patient is married IT sales professional) and lives at home with her  husband.   Patient has a Transport planner.   Patient drinks one can of Diet Dr. Malachi Bonds daily.   Patient has a college education.   Patient is right- handed.   Patient has two children and two step-children.    Outpatient Encounter Medications as of 01/06/2018  Medication Sig Note  . amitriptyline (ELAVIL) 25 MG tablet TAKE ONE TABLET BY MOUTH AT BEDTIME   . cyclobenzaprine (FLEXERIL) 10 MG tablet TAKE 1 TABLET BY MOUTH AT BEDTIME   . DULoxetine (CYMBALTA) 60 MG capsule TAKE 1 CAPSULE BY MOUTH ONCE DAILY   . lansoprazole (PREVACID) 30 MG capsule Take 20 mg by mouth.  12/04/2015: Received from: [facility]  . levothyroxine (SYNTHROID, LEVOTHROID) 25 MCG tablet Take 1 tablet (25 mcg total) by mouth daily before breakfast.   . Magnesium 500 MG CAPS Take 1 capsule by mouth at bedtime.   Marland Kitchen  OVER THE COUNTER MEDICATION Doterra   . psyllium (REGULOID) 0.52 g capsule Take 0.52 g by mouth daily.   . [DISCONTINUED] traZODone (DESYREL) 50 MG tablet Take 1 tablet (50 mg total) by mouth at bedtime as needed for sleep.    No facility-administered encounter medications on file as of 01/06/2018.     Allergies  Allergen Reactions  . Codeine     hallucination  . Hydrocodone   . Levofloxacin Other (See Comments)  . Minocin [Minocycline]   . Nsaids Other (See Comments)    Other reaction(s): OTHER Other reaction(s): OTHER    Review of Systems  Constitutional: Positive for malaise/fatigue (chronic). Negative for fever.  Eyes: Negative.   Respiratory: Negative for cough, shortness of breath and wheezing.   Cardiovascular: Negative for chest pain, palpitations, orthopnea, claudication and leg swelling.  Gastrointestinal: Negative.   Musculoskeletal: Positive for back pain, myalgias and neck pain.  Skin: Negative.   Neurological: Negative for dizziness, weakness and headaches.  Endo/Heme/Allergies: Negative.   Psychiatric/Behavioral: Positive for depression (much improved but not totally gone). Negative for hallucinations, memory loss, substance abuse and suicidal ideas. The patient is nervous/anxious (mild) and has insomnia (mild-mod).     Objective:  BP 104/78 (BP Location: Right Arm, Patient Position: Sitting, Cuff Size: Normal)   Pulse 98   Temp 98.3 F (36.8 C) (Oral)   Resp 16   Wt 137 lb (62.1 kg)   LMP 01/28/1997   BMI 27.21 kg/m   Physical Exam  Constitutional: She is oriented to person, place, and time and well-developed, well-nourished, and in no distress.  HENT:  Head: Normocephalic and atraumatic.  Right Ear: External ear normal.  Left Ear: External ear normal.  Nose: Nose normal.  Eyes: Conjunctivae are normal.  Neck: No thyromegaly present.  Cardiovascular: Normal rate, regular rhythm and normal heart sounds.  Pulmonary/Chest: Effort normal and breath sounds  normal.  Abdominal: Soft. Bowel sounds are normal.  Lymphadenopathy:    She has no cervical adenopathy.  Neurological: She is alert and oriented to person, place, and time. Gait normal. GCS score is 15.  Skin: Skin is warm and dry.  Psychiatric: Mood, memory, affect and judgment normal.    Assessment and Plan :  1. Depression, major, in remission ([place])   2. Other hyperlipidemia Statin intolerant? - Comprehensive metabolic panel - Lipid panel - ezetimibe (ZETIA) 10 MG tablet; Take 1 tablet (10 mg total) by mouth daily.  Dispense: 90 tablet; Refill: 3  3. Depression, unspecified depression type   4. Hypothyroidism, unspecified type  - TSH  5. Other fatigue  -  CBC with Differential/Platelet - TSH  6. DDD (degenerative disc disease), cervical  - Ambulatory referral to Neurosurgery  7. DDD (degenerative disc disease), lumbosacral - Ambulatory referral to Neurosurgery Likely Dx. Pt wishes to see her neurosurgeon. 8.Fibromyalgia 9.GAD  I have done the exam and reviewed the chart and it is accurate to the best of my knowledge. Development worker, community has been used and  any errors in dictation or transcription are unintentional. Miguel Aschoff M.D. [facility]

## 2018-01-06 NOTE — Patient Instructions (Signed)
Get labs in 3 weeks after starting the Zetia.

## 2018-01-20 ENCOUNTER — Telehealth: Payer: Self-pay

## 2018-01-20 NOTE — Telephone Encounter (Signed)
Patient states she was referred to neurosurgery for DDD. She received a call from Dr. Clarice Pole office with a appointment for 02/19/18 at 12:15pm. Patient states she was told by Dr. Clarice Pole office to see if PCP would order MRI to bring to her appointment. Patient is requesting a open MRI machine due to being claustrophobic.   CB# [phone].

## 2018-01-21 NOTE — Telephone Encounter (Signed)
We can try-she may need a visit to try to get MRI covered.

## 2018-01-22 ENCOUNTER — Other Ambulatory Visit: Payer: Self-pay | Admitting: Family Medicine

## 2018-01-22 ENCOUNTER — Other Ambulatory Visit: Payer: Self-pay

## 2018-01-22 DIAGNOSIS — E039 Hypothyroidism, unspecified: Secondary | ICD-10-CM

## 2018-01-22 NOTE — Telephone Encounter (Signed)
Appointment made for patient.

## 2018-01-26 ENCOUNTER — Ambulatory Visit (INDEPENDENT_AMBULATORY_CARE_PROVIDER_SITE_OTHER): Payer: PPO | Admitting: Family Medicine

## 2018-01-26 ENCOUNTER — Encounter: Payer: Self-pay | Admitting: Family Medicine

## 2018-01-26 VITALS — BP 98/62 | HR 98 | Temp 98.5°F | Resp 14 | Wt 137.0 lb

## 2018-01-26 DIAGNOSIS — M5440 Lumbago with sciatica, unspecified side: Secondary | ICD-10-CM

## 2018-01-26 DIAGNOSIS — G8929 Other chronic pain: Secondary | ICD-10-CM

## 2018-01-26 MED ORDER — NAPROXEN 375 MG PO TABS: 375.0000 mg | ORAL_TABLET | Freq: Two times a day (BID) | ORAL | 3 refills | Status: DC | PRN

## 2018-01-26 MED ORDER — DIAZEPAM 2 MG PO TABS: 2.0000 mg | ORAL_TABLET | Freq: Two times a day (BID) | ORAL | 1 refills | Status: DC | PRN

## 2018-01-26 NOTE — Progress Notes (Signed)
Patient: [name] Brown Female    DOB: 1955-10-26   62 y.o.   MRN: [id number] Visit Date: 01/26/2018  Today's Provider: Wilhemena Durie, MD   Chief Complaint  Patient presents with  . Back Pain   Subjective:    HPI Pt is here today for a face to face for a MRI for her back. She was referred to the surgeon that did surgery on her back and he wanted her PCP to go ahead and order a MRI of her back. Pt reports that she has back pain in her lower back and runs down both legs but worse on her left leg. Pt has back pain at night and during day has radiation of pain from lateral buttocks down left lateral leg.    Allergies  Allergen Reactions  . Codeine     hallucination  . Hydrocodone   . Levofloxacin Other (See Comments)  . Minocin [Minocycline]   . Nsaids Other (See Comments)    Other reaction(s): OTHER Other reaction(s): OTHER     Current Outpatient Medications:  .  amitriptyline (ELAVIL) 25 MG tablet, TAKE ONE TABLET BY MOUTH AT BEDTIME, Disp: 90 tablet, Rfl: 3 .  cyclobenzaprine (FLEXERIL) 10 MG tablet, TAKE 1 TABLET BY MOUTH AT BEDTIME, Disp: 30 tablet, Rfl: 5 .  DULoxetine (CYMBALTA) 60 MG capsule, TAKE 1 CAPSULE BY MOUTH ONCE DAILY, Disp: 90 capsule, Rfl: 1 .  ezetimibe (ZETIA) 10 MG tablet, Take 1 tablet (10 mg total) by mouth daily., Disp: 90 tablet, Rfl: 3 .  lansoprazole (PREVACID) 30 MG capsule, Take 20 mg by mouth. , Disp: , Rfl:  .  levothyroxine (SYNTHROID, LEVOTHROID) 25 MCG tablet, TAKE 1 TABLET BY MOUTH ONCE DAILY BEFORE BREAKFAST, Disp: 30 tablet, Rfl: 12 .  Magnesium 500 MG CAPS, Take 1 capsule by mouth at bedtime., Disp: , Rfl:  .  polyethylene glycol (MIRALAX / GLYCOLAX) packet, Take 17 g by mouth daily., Disp: , Rfl:  .  psyllium (REGULOID) 0.52 g capsule, Take 0.52 g by mouth daily., Disp: , Rfl:  .  OVER THE COUNTER MEDICATION, Doterra, Disp: , Rfl:   Review of Systems  Constitutional: Negative.   HENT: Negative.   Eyes: Negative.     Respiratory: Negative.   Cardiovascular: Negative.   Gastrointestinal: Negative.   Endocrine: Negative.   Genitourinary: Negative.   Musculoskeletal: Positive for back pain.  Skin: Negative.   Allergic/Immunologic: Negative.   Neurological: Negative.   Hematological: Negative.   Psychiatric/Behavioral: Negative.     Social History   Tobacco Use  . Smoking status: Never Smoker  . Smokeless tobacco: Never Used  Substance Use Topics  . Alcohol use: Yes    Comment: 1 every 2 weeks   Objective:   BP 98/62 (BP Location: Left Arm, Patient Position: Sitting, Cuff Size: Normal)   Pulse 98   Temp 98.5 F (36.9 C) (Oral)   Resp 14   Wt 137 lb (62.1 kg)   LMP 01/28/1997   BMI 27.21 kg/m  Vitals:   01/26/18 0814  BP: 98/62  Pulse: 98  Resp: 14  Temp: 98.5 F (36.9 C)  TempSrc: Oral  Weight: 137 lb (62.1 kg)     Physical Exam  Constitutional: She is oriented to person, place, and time. She appears well-developed and well-nourished.  HENT:  Head: Normocephalic and atraumatic.  Eyes: No scleral icterus.  Neck: No thyromegaly present.  Cardiovascular: Normal rate, regular rhythm and normal heart sounds.  Pulmonary/Chest:  Effort normal and breath sounds normal.  Abdominal: Soft.  Neurological: She is alert and oriented to person, place, and time. She displays normal reflexes. She exhibits normal muscle tone. Coordination normal.  Strength normal in both legs. Straight leg raise negative.  Skin: Skin is warm and dry.  Psychiatric: She has a normal mood and affect. Her behavior is normal. Judgment and thought content normal.        Assessment & Plan:     1. Chronic low back pain with sciatica, sciatica laterality unspecified, unspecified back pain laterality RTC 6 weeks. PT may be very helpful. - naproxen (NAPROSYN) 375 MG tablet; Take 1 tablet (375 mg total) by mouth 2 (two) times daily as needed.  Dispense: 60 tablet; Refill: 3 - Ambulatory referral to Physical  Therapy - diazepam (VALIUM) 2 MG tablet; Take 1 tablet (2 mg total) by mouth every 12 (twelve) hours as needed for anxiety.  Dispense: 30 tablet; Refill: 1      I have done the exam and reviewed the above chart and it is accurate to the best of my knowledge. Development worker, community has been used in this note in any air is in the dictation or transcription are unintentional.  Wilhemena Durie, MD  [facility] .

## 2018-01-27 ENCOUNTER — Ambulatory Visit (INDEPENDENT_AMBULATORY_CARE_PROVIDER_SITE_OTHER): Payer: PPO | Admitting: Obstetrics & Gynecology

## 2018-01-27 ENCOUNTER — Other Ambulatory Visit: Payer: Self-pay

## 2018-01-27 ENCOUNTER — Encounter: Payer: Self-pay | Admitting: Obstetrics & Gynecology

## 2018-01-27 VITALS — BP 100/70 | HR 84 | Resp 16 | Ht 59.5 in | Wt 136.0 lb

## 2018-01-27 DIAGNOSIS — Z01419 Encounter for gynecological examination (general) (routine) without abnormal findings: Secondary | ICD-10-CM | POA: Diagnosis not present

## 2018-01-27 DIAGNOSIS — Z0000 Encounter for general adult medical examination without abnormal findings: Secondary | ICD-10-CM | POA: Diagnosis not present

## 2018-01-27 LAB — POCT URINALYSIS DIPSTICK
BILIRUBIN UA: NEGATIVE
Glucose, UA: NEGATIVE
KETONES UA: NEGATIVE
Leukocytes, UA: NEGATIVE
Nitrite, UA: NEGATIVE
PH UA: 7 (ref 5.0–8.0)
Protein, UA: NEGATIVE
RBC UA: NEGATIVE
UROBILINOGEN UA: 0.2 U/dL

## 2018-01-27 NOTE — Progress Notes (Signed)
62 y.o. [id number] MarriedCaucasianF here for annual exam.  Doing well.  Having issues with cholesterol.  On Zetia.  Has follow-up with Dr. Rosanna Randy in about three weeks for repeat blood work.    Denies vaginal bleeding.    Patient's last menstrual period was 01/28/1997.          Sexually active: No.  The current method of family planning is status post hysterectomy.    Exercising: Yes.    walking Smoker:  no  Health Maintenance: Pap:  10/14/16 Neg  History of abnormal Pap:  yes MMG:  11/19/17 BIRADS1:Neg  Colonoscopy:  08/12/17 f/u 10 years  BMD:   10/23/16 Osteopenia  TDaP:  2018  Pneumonia vaccine(s):  No Shingrix: No.  Wants to have this done just didn't it done last year.  Hep C testing: 12/11/16 Neg  Screening Labs: PCP, Urine today: Normal    reports that she has never smoked. She has never used smokeless tobacco. She reports that she drinks alcohol. She reports that she does not use drugs.  Past Medical History:  Diagnosis Date  . Anxiety   . Cervical spondylosis without myelopathy   . Cervicalgia 01/11/2014  . Degeneration of cervical intervertebral disc   . Endometriosis   . Headache(784.0)   . High cholesterol   . History of fusion of cervical spine 01/11/2014  . IC (interstitial cystitis)   . Myalgia and myositis, unspecified   . Osteoarthrosis, unspecified whether generalized or localized, unspecified site   . Other and unspecified hyperlipidemia   . Pain in joint, shoulder region 01/11/2014  . Pain in joint, upper arm   . Squamous cell carcinoma    [facility]- place on chest  . Unspecified disorder of bladder     Past Surgical History:  Procedure Laterality Date  . ABDOMINAL HYSTERECTOMY  01/1996  . BLADDER SUSPENSION  07/2010   With cystocele repair, Dr. Amalia Hailey  . ELBOW SURGERY Right 08/2011  . SPINAL FUSION  10/2010   C5-C6, C6-C7, Dr. Ellene Route  . TONSILLECTOMY  1969    Current Outpatient Medications  Medication Sig Dispense Refill  . amitriptyline  (ELAVIL) 25 MG tablet TAKE ONE TABLET BY MOUTH AT BEDTIME 90 tablet 3  . cyclobenzaprine (FLEXERIL) 10 MG tablet TAKE 1 TABLET BY MOUTH AT BEDTIME 30 tablet 5  . DULoxetine (CYMBALTA) 60 MG capsule TAKE 1 CAPSULE BY MOUTH ONCE DAILY 90 capsule 1  . ezetimibe (ZETIA) 10 MG tablet Take 1 tablet (10 mg total) by mouth daily. 90 tablet 3  . lansoprazole (PREVACID) 30 MG capsule Take 20 mg by mouth.     . levothyroxine (SYNTHROID, LEVOTHROID) 25 MCG tablet TAKE 1 TABLET BY MOUTH ONCE DAILY BEFORE BREAKFAST 30 tablet 12  . Magnesium 500 MG CAPS Take 1 capsule by mouth at bedtime.    Marland Kitchen OVER THE COUNTER MEDICATION Doterra    . polyethylene glycol (MIRALAX / GLYCOLAX) packet Take 17 g by mouth daily.    . psyllium (REGULOID) 0.52 g capsule Take 0.52 g by mouth daily.    . diazepam (VALIUM) 2 MG tablet Take 1 tablet (2 mg total) by mouth every 12 (twelve) hours as needed for anxiety. (Patient not taking: Reported on 01/27/2018) 30 tablet 1  . naproxen (NAPROSYN) 375 MG tablet Take 1 tablet (375 mg total) by mouth 2 (two) times daily as needed. (Patient not taking: Reported on 01/27/2018) 60 tablet 3   No current facility-administered medications for this visit.     Family History  Problem Relation Age of Onset  . Osteoarthritis Mother   . Breast cancer Mother   . Heart disease Father   . Osteoarthritis Maternal Grandfather        Rheumatoid  . Cancer Paternal Grandmother        tongue & throat  . Breast cancer Unknown        Aunt  . Breast cancer Unknown        Aunt  . Breast cancer Maternal Aunt     Review of Systems  Genitourinary: Positive for frequency and urgency.  Musculoskeletal: Positive for joint pain and myalgias.       Muscle weakness   Endo/Heme/Allergies:       Craving sweets   All other systems reviewed and are negative.   Exam:   BP 100/70 (BP Location: Left Arm, Patient Position: Sitting, Cuff Size: Normal)   Pulse 84   Resp 16   Ht 4' 11.5" (1.511 m)   Wt 136 lb  (61.7 kg)   LMP 01/28/1997   BMI 27.01 kg/m    Height: 4' 11.5" (151.1 cm)  Ht Readings from Last 3 Encounters:  01/27/18 4' 11.5" (1.511 m)  10/14/16 4' 11.5" (1.511 m)  07/08/14 4' 11.5" (1.511 m)    General appearance: alert, cooperative and appears stated age Head: Normocephalic, without obvious abnormality, atraumatic Neck: no adenopathy, supple, symmetrical, trachea midline and thyroid normal to inspection and palpation Lungs: clear to auscultation bilaterally Breasts: normal appearance, no masses or tenderness Heart: regular rate and rhythm Abdomen: soft, non-tender; bowel sounds normal; no masses,  no organomegaly Extremities: extremities normal, atraumatic, no cyanosis or edema Skin: Skin color, texture, turgor normal. No rashes or lesions Lymph nodes: Cervical, supraclavicular, and axillary nodes normal. No abnormal inguinal nodes palpated Neurologic: Grossly normal   Pelvic: External genitalia:  no lesions              Urethra:  normal appearing urethra with no masses, tenderness or lesions              Bartholins and Skenes: normal                 Vagina: normal appearing vagina with normal color and discharge, no lesions              Cervix: absent              Pap taken: No. Bimanual Exam:  Uterus:  uterus absent              Adnexa: no mass, fullness, tenderness               Rectovaginal: Confirms               Anus:  normal sphincter tone, no lesions  Chaperone was present for exam.  A:  Well Woman with normal exam PMP, no HRT Fibromyalgia Interstitial cystitis on anitriptyline Hypothyroidism H/o TAH/BSO Cystocele and sling placement 11/11  P:   Mammogram guidelines reviewed.  Doing 3D MMG. pap smear neg 2018.   Lab work scheduled with Dr. Rosanna Randy Pt will plan to get Shingrix this year.  Information given about where to receive this return annually or prn

## 2018-01-27 NOTE — Patient Instructions (Signed)
[place]

## 2018-02-10 ENCOUNTER — Ambulatory Visit: Payer: PPO | Attending: Family Medicine | Admitting: Physical Therapy

## 2018-02-10 ENCOUNTER — Encounter: Payer: Self-pay | Admitting: Physical Therapy

## 2018-02-10 DIAGNOSIS — G8929 Other chronic pain: Secondary | ICD-10-CM | POA: Diagnosis not present

## 2018-02-10 DIAGNOSIS — M545 Low back pain: Secondary | ICD-10-CM | POA: Insufficient documentation

## 2018-02-10 NOTE — Therapy (Signed)
[facility] [address], [place], [address] Phone: [phone]   Fax:  [phone]  Physical Therapy Evaluation  Patient Details  Name: [name] Brown MRN: [id number] Date of Birth: Feb 22, 1956 Referring Provider: Miguel Aschoff   Encounter Date: 02/10/2018  PT End of Session - 02/10/18 1049    Visit Number  1    Number of Visits  17    PT Start Time  1026    PT Stop Time  1115    PT Time Calculation (min)  49 min    Activity Tolerance  Patient tolerated treatment well    Behavior During Therapy  [facility] for tasks assessed/performed       Past Medical History:  Diagnosis Date  . Anxiety   . Cervical spondylosis without myelopathy   . Cervicalgia 01/11/2014  . Degeneration of cervical intervertebral disc   . Endometriosis   . Headache(784.0)   . High cholesterol   . History of fusion of cervical spine 01/11/2014  . IC (interstitial cystitis)   . Myalgia and myositis, unspecified   . Osteoarthrosis, unspecified whether generalized or localized, unspecified site   . Other and unspecified hyperlipidemia   . Pain in joint, shoulder region 01/11/2014  . Pain in joint, upper arm   . Squamous cell carcinoma    [facility]- place on chest  . Unspecified disorder of bladder     Past Surgical History:  Procedure Laterality Date  . BLADDER SUSPENSION  07/2010   With cystocele repair, Dr. Amalia Hailey  . ELBOW SURGERY Right 08/2011  . HYSTERECTOMY ABDOMINAL WITH SALPINGO-OOPHORECTOMY  01/1996  . SPINAL FUSION  10/2010   C5-C6, C6-C7, Dr. Ellene Route  . TONSILLECTOMY  1969    There were no vitals filed for this visit.   Subjective Assessment - 02/10/18 1033    Subjective  Chronic LBP    Pertinent History  Pt is a 62 year old female with history of chronic LBP (multiple years) that she reports has been exacerbated following a fall Feb 2019 where she was standing on a chair to swat at a bug and the chair gave  away under her. Pt reports she is currently having pain across bilat LB with pain "deep in L buttock" that she reports is dull and achy. Patient reports worst pain in the past week 7/10; best: 5/10. Patient reports she is having difficulty squatting to play with her 34 year old grandson and standing from seated positon. Patient her pain wakes her up in the middle of the night if she lays on the L side. Pt denies N/V, unexplained weight fluctuation, saddle paresthesia, fever, night sweats, B&B changes or unrelenting night pain at this time.    Limitations  Lifting;House hold activities;Walking    How long can you sit comfortably?  unlimited    How long can you stand comfortably?  unlimited    How long can you walk comfortably?  21min    Diagnostic tests  MRI scheduled "if PT does not clear up symptoms"    Currently in Pain?  Yes    Pain Score  5     Pain Location  Back    Pain Orientation  Left;Lower    Pain Descriptors / Indicators  Nagging;Dull;Constant;Sharp    Pain Type  Chronic pain;Acute pain    Pain Radiating Towards  L buttock which is a new pain complaint over the past 3 months    Pain Onset  More than  a month ago    Pain Frequency  Constant    Aggravating Factors   Standing from sitting position; squatting    Pain Relieving Factors  none    Effect of Pain on Daily Activities  Unable to play with her grandchildren without pain          *eval limited by patient arriving 13 mins late*  ROM Lumbar flexion: wnl with "pulling" at L glute/hamstring Lumbar rotation: R slightly limited with concordant sign with overpressure L: wnl Lumbar ext: wnl Lumbar lateral flexion: wnl Hip AROM and PROM wnl  Strength Hip flex: L: 4/5 R: 4+/5 Hip abd L:4-/5  R 4+/5 Hip Ext in prone: L 3/5 R 3+/5 Hip IR: 4/5 bilat Hip ER: L 4-/5 w/ pain R 4/5     Special Tests/ Other Reflexes 2+ bilat patella (-) slump bilat (-) Crossed SLR (+) Elys on L for hip flexor tightness No pain responsed with  CPA L1-S2; good joint movement noted. Pain response with L lumbar UPA Grade where patient notes "discomfor restriction (+) 90/90 bilat L>R (+) FABER L for "pulling on R buttock" (+) Obers bilat (-) Scour bilat (- )SI cluster  5xSTS 18sec  Palpation: Increased tension and trigger points at bilat trigger points L>R. Trigger points with concordant sign with L piriformis/glute palpation   Ther-Ex -Seated piriformis stretch 4x  30sec holds (HEP) - Bridge exercise 1x 10 - Modified child's pose 1x 30sec hold R lateral bias -Education on PT role and there-ex role on pain and strengthening muscles surrounding the spine to increase stability and decrease pain -Education on proper squat mechanics   [facility] PT Assessment - 02/10/18 0001      Assessment   Medical Diagnosis  Chronic LBP    Referring Provider  Miguel Aschoff    Onset Date/Surgical Date  11/11/17    Hand Dominance  Right    Next MD Visit  end of May    Prior Therapy  Yes; successful      Balance Screen   Has the patient fallen in the past 6 months  Yes    How many times?  1    Has the patient had a decrease in activity level because of a fear of falling?   No    Is the patient reluctant to leave their home because of a fear of falling?   No      Home Environment   Living Environment  Private residence    Living Arrangements  Spouse/significant other    Home Access  Stairs to enter    Entrance Stairs-Number of Steps  4    Entrance Stairs-Rails  Left      Prior Function   Level of Independence  Independent    Vocation  Retired    Leisure  -- Playing with grandchildren; walking for exercise                           Objective measurements completed on examination: See above findings.              PT Education - 02/10/18 1048    Education provided  Yes    Education Details  Patient was educated on diagnosis, anatomy and pathology involved, prognosis, role of PT, and was given an HEP,  demonstrating exercise with proper form following verbal and tactile cues, and was given a paper hand out to continue exercise at home. Pt was educated on and agreed  to plan of care.    Person(s) Educated  Patient    Methods  Explanation;Demonstration;Tactile cues;Verbal cues;Handout    Comprehension  Returned demonstration;Verbalized understanding;Verbal cues required;Tactile cues required       PT Short Term Goals - 02/10/18 1402      PT SHORT TERM GOAL #1   Title  Pt will be independent with HEP in order to improve strength, flexibility, and balance in order to decrease fall risk and improve function at home and work.    Time  2    Period  Weeks    Status  New        PT Long Term Goals - 02/10/18 1403      PT LONG TERM GOAL #1   Title  Patient will increase FOTO score to 60 to demonstrate predicted increase in functional mobility to complete ADLs    Baseline  02/10/18 54    Time  8    Period  Weeks    Status  New      PT LONG TERM GOAL #2   Title  Pt will decrease mODI scoreby at least 13 points in order demonstrate clinically significant reduction in pain/disability    Baseline  02/10/18 26%    Time  8    Period  Weeks    Status  New      PT LONG TERM GOAL #3   Title  Pt will decrease 5TSTS by at least 3 seconds in order to demonstrate clinically significant improvement in LE strength    Baseline  02/10/18 18sec    Time  8    Period  Weeks    Status  New      PT LONG TERM GOAL #4   Title  Pt will decrease worst pain as reported on NPRS by at least 3 points in order to demonstrate clinically significant reduction in pain.     Baseline  02/10/18: worst 8/10    Time  8    Period  Weeks    Status  New      PT LONG TERM GOAL #5   Title  Pt will increase hip ext strength by at least 1/2 MMT grade in order to demonstrate improvement in strength and function    Baseline  02/10/18 L 3/5 R 3+/5    Time  8    Period  Weeks    Status  New             Plan - 02/10/18  1408    Clinical Impression Statement  Pt is a 62 year old female with chronic LBP with exacerbation at LB and L hip following fall off of a chair while trying to "swat a bug" in Feb. Current activity limitations in bending, prolonged walking, self-care ADLs, standing from prolonged sitting, and squatting/kneeling. Impairments including painful trunk ROM, soft tissue tenderness glute med/min/piriformis/gemelli, glute weakness, low back/L flank pain with referral into L buttock region, and core weakness. Participation restriction: playing with 72 year old grandchild. Pt will benefit from skilled PT intervention to address the aforementioned impairments and activity limitations for best return to PLOF. Pt will benefit from skilled PT intervention to address the aforementioned impairments and activity limitations for best return to PLOF.    History and Personal Factors relevant to plan of care:  chronic LBP; hx of fibromyalgia; cervical fusion    Clinical Presentation  Evolving    Clinical Presentation due to:  2 personal factors/comorbidities, 3 body systems/activity limitations/participation restrictions  Clinical Decision Making  Moderate    Rehab Potential  Good    Clinical Impairments Affecting Rehab Potential  (+) strong social support, motivation (-) chronicity of pain/fibromyalgia sx, psycho-social factors, hx of spine surgery, sedentary lifestyle, age    PT Frequency  2x / week    PT Duration  8 weeks    PT Treatment/Interventions  ADLs/Self Care Home Management;Electrical Stimulation;Aquatic Therapy;Iontophoresis 4mg /ml Dexamethasone;Moist Heat;Traction;Cryotherapy;Ultrasound;Functional mobility training;Manual techniques;Dry needling;Passive range of motion;Neuromuscular re-education;Balance training;Gait training;Therapeutic exercise;Therapeutic activities;Patient/family education;Energy conservation;Taping    PT Next Visit Plan  6MWT (gait analysis); balance screen; HEP review    PT Home  Exercise Plan  bridge, modified R lateral child's pose, seated piriformis stretch    Consulted and Agree with Plan of Care  Patient       Patient will benefit from skilled therapeutic intervention in order to improve the following deficits and impairments:  Increased fascial restricitons, Pain, Improper body mechanics, Decreased mobility, Increased muscle spasms, Impaired tone, Postural dysfunction, Decreased endurance, Decreased activity tolerance, Decreased range of motion, Decreased strength, Decreased balance, Difficulty walking, Impaired flexibility  Visit Diagnosis: Chronic right-sided low back pain without sciatica     Problem List Patient Active Problem List   Diagnosis Date Noted  . Depression, major, in remission ([place]) 01/06/2018  . Hyperlipidemia 06/07/2015  . IBS (irritable bowel syndrome) 03/01/2015  . Depression 03/01/2015  . Fibromyalgia 03/01/2015  . Hypothyroid 03/01/2015  . Pain in joint, shoulder region 01/11/2014  . Cervicalgia 01/11/2014  . History of fusion of cervical spine 01/11/2014  . Headache(784.0) 03/29/2013  . Chronic interstitial cystitis 11/05/2012    [name] Brown 02/10/2018, 2:16 PM  [facility] [address], [place], [address] Phone: [phone]   Fax:  [phone]  Name: [name] Brown MRN: [id number] Date of Birth: April 02, 1956

## 2018-02-12 ENCOUNTER — Ambulatory Visit: Payer: PPO | Admitting: Physical Therapy

## 2018-02-12 ENCOUNTER — Encounter: Payer: Self-pay | Admitting: Physical Therapy

## 2018-02-12 DIAGNOSIS — M545 Low back pain: Principal | ICD-10-CM

## 2018-02-12 DIAGNOSIS — G8929 Other chronic pain: Secondary | ICD-10-CM

## 2018-02-12 NOTE — Therapy (Signed)
[facility] [address], [place], [address] Phone: [phone]   Fax:  [phone]  Physical Therapy Treatment  Patient Details  Name: [name] Brown MRN: [id number] Date of Birth: 1956-04-15 Referring Provider: Miguel Aschoff   Encounter Date: 02/12/2018  PT End of Session - 02/12/18 1158    Visit Number  2    Number of Visits  17    PT Start Time  1119    PT Stop Time  1159    PT Time Calculation (min)  40 min    Activity Tolerance  Patient tolerated treatment well    Behavior During Therapy  [facility] for tasks assessed/performed       Past Medical History:  Diagnosis Date  . Anxiety   . Cervical spondylosis without myelopathy   . Cervicalgia 01/11/2014  . Degeneration of cervical intervertebral disc   . Endometriosis   . Headache(784.0)   . High cholesterol   . History of fusion of cervical spine 01/11/2014  . IC (interstitial cystitis)   . Myalgia and myositis, unspecified   . Osteoarthrosis, unspecified whether generalized or localized, unspecified site   . Other and unspecified hyperlipidemia   . Pain in joint, shoulder region 01/11/2014  . Pain in joint, upper arm   . Squamous cell carcinoma    [facility]- place on chest  . Unspecified disorder of bladder     Past Surgical History:  Procedure Laterality Date  . BLADDER SUSPENSION  07/2010   With cystocele repair, Dr. Amalia Hailey  . ELBOW SURGERY Right 08/2011  . HYSTERECTOMY ABDOMINAL WITH SALPINGO-OOPHORECTOMY  01/1996  . SPINAL FUSION  10/2010   C5-C6, C6-C7, Dr. Ellene Route  . TONSILLECTOMY  1969    There were no vitals filed for this visit.  Subjective Assessment - 02/12/18 1123    Subjective  Patient reports she is continuing to have LBP and R sided hip pain, that she reports her LBP is an "ache" sensation with "knot" sensation in the L hip. Patient reports 6/10 pain today, following doing yard work yesterday with prolonged bending.      Pertinent History  Pt is a 62 year old female with history of chronic LBP (multiple years) that she reports has been exacerbated following a fall Feb 2019 where she was standing on a chair to swat at a bug and the chair gave away under her. Pt reports she is currently having pain across bilat LB with pain "deep in L buttock" that she reports is dull and achy. Patient reports worst pain in the past week 7/10; best: 5/10. Patient reports she is having difficulty squatting to play with her 62 year old grandson and standing from seated positon. Patient her pain wakes her up in the middle of the night if she lays on the L side. Pt denies N/V, unexplained weight fluctuation, saddle paresthesia, fever, night sweats, B&B changes or unrelenting night pain at this time.    Limitations  Lifting;House hold activities;Walking    How long can you sit comfortably?  unlimited    How long can you stand comfortably?  unlimited    How long can you walk comfortably?  27min    Diagnostic tests  MRI scheduled "if PT does not clear up symptoms"    Pain Onset  More than a month ago         Manual  -STM with trigger point release to L piriformis and prox biceps femoris (prolonged time spend  here as this is patient's chief complaints and areas of the most tension and trigger points)  -STM and trigger point release to bilat lumbar paraspinals as patients secondary complaint  -Grade I-II CPA L4-S3 30sec bouts 4 bouts each segment for pain modulation -L manual hamstring stretch 30sec hold 3x with patient able to fully ext knee in 90/90 position following stretching + other manual techniques -L manual glute stretch 3x 30sec hold  Ther-Ex -Review of modified child's pose and seated piriformis stretch x30sec hold  Therapeutic Activites Transport planner Education with demonstration and explanation of proper squat mechanics with glute activation and decreasing stress on lumbar spine and on smaller hip movers. With TC and VC  from PT patient was able to complete correct squat for several repetitions. PT educated patient on utilizing these mechanics when completing yard work, squatting to pick things up, etc.  -Review of bridge exercise with prior glute contraction, with patient ensuring glute contraction without lumbar compensation                        PT Education - 02/12/18 1146    Education provided  Yes    Education Details  Squatting mechanics with proper glute contraction    Person(s) Educated  Patient    Methods  Explanation;Demonstration;Tactile cues;Verbal cues    Comprehension  Verbalized understanding;Verbal cues required;Returned demonstration       PT Short Term Goals - 02/10/18 1402      PT SHORT TERM GOAL #1   Title  Pt will be independent with HEP in order to improve strength, flexibility, and balance in order to decrease fall risk and improve function at home and work.    Time  2    Period  Weeks    Status  New        PT Long Term Goals - 02/10/18 1403      PT LONG TERM GOAL #1   Title  Patient will increase FOTO score to 60 to demonstrate predicted increase in functional mobility to complete ADLs    Baseline  02/10/18 54    Time  8    Period  Weeks    Status  New      PT LONG TERM GOAL #2   Title  Pt will decrease mODI scoreby at least 13 points in order demonstrate clinically significant reduction in pain/disability    Baseline  02/10/18 26%    Time  8    Period  Weeks    Status  New      PT LONG TERM GOAL #3   Title  Pt will decrease 5TSTS by at least 3 seconds in order to demonstrate clinically significant improvement in LE strength    Baseline  02/10/18 18sec    Time  8    Period  Weeks    Status  New      PT LONG TERM GOAL #4   Title  Pt will decrease worst pain as reported on NPRS by at least 3 points in order to demonstrate clinically significant reduction in pain.     Baseline  02/10/18: worst 8/10    Time  8    Period  Weeks    Status  New       PT LONG TERM GOAL #5   Title  Pt will increase hip ext strength by at least 1/2 MMT grade in order to demonstrate improvement in strength and function    Baseline  02/10/18  L 3/5 R 3+/5    Time  8    Period  Weeks    Status  New            Plan - 02/12/18 1234    Clinical Impression Statement  Following manual techniques patient reports decreased pain to 6/10 with increased hi pmobility. PT advised patient to continue utilizing heat and stretching to maintain PT gains. PT spent time educating patient on proper squat mechanics: utilizing hip + knee flexion as opposed to bending at hips and lifting with back to reduce lumbar strain, and with proper glute activation. After several repetitions and demo/TC/VC from PT patient is able to complete mini squats with proper mechanics. PT encouraged patient to utilize this form for ADLs, STS transfers, etc.     Rehab Potential  Good    Clinical Impairments Affecting Rehab Potential  (+) strong social support, motivation (-) chronicity of pain/fibromyalgia sx, psycho-social factors, hx of spine surgery, sedentary lifestyle, age    PT Frequency  2x / week    PT Duration  8 weeks    PT Treatment/Interventions  ADLs/Self Care Home Management;Electrical Stimulation;Aquatic Therapy;Iontophoresis 4mg /ml Dexamethasone;Moist Heat;Traction;Cryotherapy;Ultrasound;Functional mobility training;Manual techniques;Dry needling;Passive range of motion;Neuromuscular re-education;Balance training;Gait training;Therapeutic exercise;Therapeutic activities;Patient/family education;Energy conservation;Taping    PT Next Visit Plan  6MWT (gait analysis); balance screen; HEP review    PT Home Exercise Plan  bridge, modified R lateral child's pose, seated piriformis stretch    Consulted and Agree with Plan of Care  Patient       Patient will benefit from skilled therapeutic intervention in order to improve the following deficits and impairments:  Increased fascial  restricitons, Pain, Improper body mechanics, Decreased mobility, Increased muscle spasms, Impaired tone, Postural dysfunction, Decreased endurance, Decreased activity tolerance, Decreased range of motion, Decreased strength, Decreased balance, Difficulty walking, Impaired flexibility  Visit Diagnosis: Chronic right-sided low back pain without sciatica     Problem List Patient Active Problem List   Diagnosis Date Noted  . Depression, major, in remission ([employer]) 01/06/2018  . Hyperlipidemia 06/07/2015  . IBS (irritable bowel syndrome) 03/01/2015  . Depression 03/01/2015  . Fibromyalgia 03/01/2015  . Hypothyroid 03/01/2015  . Pain in joint, shoulder region 01/11/2014  . Cervicalgia 01/11/2014  . History of fusion of cervical spine 01/11/2014  . Headache(784.0) 03/29/2013  . Chronic interstitial cystitis 11/05/2012   Shelton Silvas PT, DPT Shelton Silvas 02/12/2018, 12:38 PM  [facility] [address], [place], [address] Phone: [phone]   Fax:  [phone]  Name: SAVINA OLSHEFSKI MRN: [id number] Date of Birth: [birth year]

## 2018-02-17 ENCOUNTER — Encounter: Payer: Self-pay | Admitting: Physical Therapy

## 2018-02-17 ENCOUNTER — Ambulatory Visit: Payer: PPO | Admitting: Physical Therapy

## 2018-02-17 DIAGNOSIS — M545 Low back pain, unspecified: Secondary | ICD-10-CM

## 2018-02-17 DIAGNOSIS — G8929 Other chronic pain: Secondary | ICD-10-CM

## 2018-02-17 NOTE — Therapy (Signed)
[place] [facility] [address] [place], [address] Phone: [phone]   Fax:  [phone]  Physical Therapy Treatment  Patient Details  Name: [name] Brown MRN: [id number] Date of Birth: 04-06-1956 Referring Provider: Miguel Aschoff   Encounter Date: 02/17/2018  PT End of Session - 02/17/18 1156    Visit Number  3    Number of Visits  17    PT Start Time  1115    PT Stop Time  1200    PT Time Calculation (min)  45 min    Activity Tolerance  Patient tolerated treatment well    Behavior During Therapy  [facility] for tasks assessed/performed       Past Medical History:  Diagnosis Date  . Anxiety   . Cervical spondylosis without myelopathy   . Cervicalgia 01/11/2014  . Degeneration of cervical intervertebral disc   . Endometriosis   . Headache(784.0)   . High cholesterol   . History of fusion of cervical spine 01/11/2014  . IC (interstitial cystitis)   . Myalgia and myositis, unspecified   . Osteoarthrosis, unspecified whether generalized or localized, unspecified site   . Other and unspecified hyperlipidemia   . Pain in joint, shoulder region 01/11/2014  . Pain in joint, upper arm   . Squamous cell carcinoma    [facility]- place on chest  . Unspecified disorder of bladder     Past Surgical History:  Procedure Laterality Date  . BLADDER SUSPENSION  07/2010   With cystocele repair, Dr. Amalia Hailey  . ELBOW SURGERY Right 08/2011  . HYSTERECTOMY ABDOMINAL WITH SALPINGO-OOPHORECTOMY  01/1996  . SPINAL FUSION  10/2010   C5-C6, C6-C7, Dr. Ellene Route  . TONSILLECTOMY  1969    There were no vitals filed for this visit.  Subjective Assessment - 02/17/18 1122    Subjective  Patient reports that she experienced good pain relief following last session, through the weekend, but that her pain came on very severe Sunday, and that Sunday and Monday her back pain was so severe she "could not stand up straight". Patient  reports that she is having some radiating pain into the buttock, but that her cheif complaint is the low back pain today.     Pertinent History  Pt is a 62 year old female with history of chronic LBP (multiple years) that she reports has been exacerbated following a fall Feb 2019 where she was standing on a chair to swat at a bug and the chair gave away under her. Pt reports she is currently having pain across bilat LB with pain "deep in L buttock" that she reports is dull and achy. Patient reports worst pain in the past week 7/10; best: 5/10. Patient reports she is having difficulty squatting to play with her 61 year old grandson and standing from seated positon. Patient her pain wakes her up in the middle of the night if she lays on the L side. Pt denies N/V, unexplained weight fluctuation, saddle paresthesia, fever, night sweats, B&B changes or unrelenting night pain at this time.    Limitations  Lifting;House hold activities;Walking    How long can you sit comfortably?  unlimited    How long can you stand comfortably?  unlimited    How long can you walk comfortably?  87min    Diagnostic tests  MRI scheduled "if PT does not clear up symptoms"    Pain Onset  More than a month ago  Manual -L2-S3 CPA grade I-II 30sec bouts 4 bouts each segment for pain modulation -STM with trigger point release to bilat lumbar paraspinals (prolonged time spent at L lumbar paraspinals as patient has increased pain and trigger points here) -STM w/ trigger point release to L piriformis and glute max     ESTIM + heat pack HiVolt ESTIM 15 min at patient tolerated L lumbar paraspinals 180V increased to 205V and R lumbar paraspinals 160V increased to 200V through treatment at bilat lumbar paraspinals. PT educated patient on use/precautions/expected outcomes of ESTIM. With PT assessing patient tolerance throughout (increasing intensity as needed), monitoring skin integrity (normal), with decreased pain noted from  patient                       PT Education - 02/17/18 1155    Education provided  Yes    Education Details  ESTIM     Person(s) Educated  Patient    Methods  Explanation;Demonstration;Verbal cues    Comprehension  Returned demonstration;Verbalized understanding;Verbal cues required       PT Short Term Goals - 02/10/18 1402      PT SHORT TERM GOAL #1   Title  Pt will be independent with HEP in order to improve strength, flexibility, and balance in order to decrease fall risk and improve function at home and work.    Time  2    Period  Weeks    Status  New        PT Long Term Goals - 02/10/18 1403      PT LONG TERM GOAL #1   Title  Patient will increase FOTO score to 60 to demonstrate predicted increase in functional mobility to complete ADLs    Baseline  02/10/18 54    Time  8    Period  Weeks    Status  New      PT LONG TERM GOAL #2   Title  Pt will decrease mODI scoreby at least 13 points in order demonstrate clinically significant reduction in pain/disability    Baseline  02/10/18 26%    Time  8    Period  Weeks    Status  New      PT LONG TERM GOAL #3   Title  Pt will decrease 5TSTS by at least 3 seconds in order to demonstrate clinically significant improvement in LE strength    Baseline  02/10/18 18sec    Time  8    Period  Weeks    Status  New      PT LONG TERM GOAL #4   Title  Pt will decrease worst pain as reported on NPRS by at least 3 points in order to demonstrate clinically significant reduction in pain.     Baseline  02/10/18: worst 8/10    Time  8    Period  Weeks    Status  New      PT LONG TERM GOAL #5   Title  Pt will increase hip ext strength by at least 1/2 MMT grade in order to demonstrate improvement in strength and function    Baseline  02/10/18 L 3/5 R 3+/5    Time  8    Period  Weeks    Status  New            Plan - 02/17/18 1252    Clinical Impression Statement  Patient responded well to manual + modality  techniques reporting decreased pain following. PT  encouraged patient to continue stretching techniques to maintain PT progress from today's session. PT educated patient on "short term" fixes for pain, vs. further along in PT where the goal will shift more to long term pain prevention.     Rehab Potential  Good    Clinical Impairments Affecting Rehab Potential  (+) strong social support, motivation (-) chronicity of pain/fibromyalgia sx, psycho-social factors, hx of spine surgery, sedentary lifestyle, age    PT Frequency  2x / week    PT Duration  8 weeks    PT Treatment/Interventions  ADLs/Self Care Home Management;Electrical Stimulation;Aquatic Therapy;Iontophoresis 4mg /ml Dexamethasone;Moist Heat;Traction;Cryotherapy;Ultrasound;Functional mobility training;Manual techniques;Dry needling;Passive range of motion;Neuromuscular re-education;Balance training;Gait training;Therapeutic exercise;Therapeutic activities;Patient/family education;Energy conservation;Taping    PT Next Visit Plan  6MWT (gait analysis); balance screen; HEP review    PT Home Exercise Plan  bridge, modified R lateral child's pose, seated piriformis stretch    Consulted and Agree with Plan of Care  Patient       Patient will benefit from skilled therapeutic intervention in order to improve the following deficits and impairments:  Increased fascial restricitons, Pain, Improper body mechanics, Decreased mobility, Increased muscle spasms, Impaired tone, Postural dysfunction, Decreased endurance, Decreased activity tolerance, Decreased range of motion, Decreased strength, Decreased balance, Difficulty walking, Impaired flexibility  Visit Diagnosis: Chronic right-sided low back pain without sciatica     Problem List Patient Active Problem List   Diagnosis Date Noted  . Depression, major, in remission ([place]) 01/06/2018  . Hyperlipidemia 06/07/2015  . IBS (irritable bowel syndrome) 03/01/2015  . Depression 03/01/2015  .  Fibromyalgia 03/01/2015  . Hypothyroid 03/01/2015  . Pain in joint, shoulder region 01/11/2014  . Cervicalgia 01/11/2014  . History of fusion of cervical spine 01/11/2014  . Headache(784.0) 03/29/2013  . Chronic interstitial cystitis 11/05/2012   Shelton Silvas PT, DPT Shelton Silvas 02/17/2018, 12:59 PM  [place] [address], [place], [address] Phone: [phone]   Fax:  [phone]  Name: DALANI METTE MRN: [id number] Date of Birth: [birth year]

## 2018-02-18 DIAGNOSIS — L570 Actinic keratosis: Secondary | ICD-10-CM | POA: Diagnosis not present

## 2018-02-18 DIAGNOSIS — Z08 Encounter for follow-up examination after completed treatment for malignant neoplasm: Secondary | ICD-10-CM | POA: Diagnosis not present

## 2018-02-18 DIAGNOSIS — Z85828 Personal history of other malignant neoplasm of skin: Secondary | ICD-10-CM | POA: Diagnosis not present

## 2018-02-18 DIAGNOSIS — X32XXXA Exposure to sunlight, initial encounter: Secondary | ICD-10-CM | POA: Diagnosis not present

## 2018-02-18 DIAGNOSIS — L718 Other rosacea: Secondary | ICD-10-CM | POA: Diagnosis not present

## 2018-02-19 ENCOUNTER — Encounter: Payer: Self-pay | Admitting: Physical Therapy

## 2018-02-19 ENCOUNTER — Ambulatory Visit: Payer: PPO | Admitting: Physical Therapy

## 2018-02-19 DIAGNOSIS — M545 Low back pain: Principal | ICD-10-CM

## 2018-02-19 DIAGNOSIS — G8929 Other chronic pain: Secondary | ICD-10-CM

## 2018-02-19 DIAGNOSIS — E039 Hypothyroidism, unspecified: Secondary | ICD-10-CM | POA: Diagnosis not present

## 2018-02-19 DIAGNOSIS — E7849 Other hyperlipidemia: Secondary | ICD-10-CM | POA: Diagnosis not present

## 2018-02-19 DIAGNOSIS — R5383 Other fatigue: Secondary | ICD-10-CM | POA: Diagnosis not present

## 2018-02-19 NOTE — Therapy (Signed)
[place] [facility] [address] [place], [address] Phone: [phone]   Fax:  [phone]  Patient Details  Name: [name] Brown MRN: [id number] Date of Birth: Dec 22, 1955 Referring Provider:  Jerrol Banana.,*  Encounter Date: 02/19/2018   Shelton Silvas 02/19/2018, 11:25 AM  [place] [facility] [address] [place], [address] Phone: [phone]   Fax:  [phone]

## 2018-02-19 NOTE — Therapy (Signed)
[place] [facility] [address], [place], [address] Phone: [phone]   Fax:  [phone]  Physical Therapy Treatment  Patient Details  Name: [name] Brown MRN: [id number] Date of Birth: September 21, 1956 Referring Provider: Miguel Aschoff   Encounter Date: 02/19/2018  PT End of Session - 02/19/18 1204    Visit Number  4    Number of Visits  17    PT Start Time  5366    PT Stop Time  1211    PT Time Calculation (min)  56 min    Activity Tolerance  Patient tolerated treatment well    Behavior During Therapy  [facility] for tasks assessed/performed       Past Medical History:  Diagnosis Date  . Anxiety   . Cervical spondylosis without myelopathy   . Cervicalgia 01/11/2014  . Degeneration of cervical intervertebral disc   . Endometriosis   . Headache(784.0)   . High cholesterol   . History of fusion of cervical spine 01/11/2014  . IC (interstitial cystitis)   . Myalgia and myositis, unspecified   . Osteoarthrosis, unspecified whether generalized or localized, unspecified site   . Other and unspecified hyperlipidemia   . Pain in joint, shoulder region 01/11/2014  . Pain in joint, upper arm   . Squamous cell carcinoma    [facility]- place on chest  . Unspecified disorder of bladder     Past Surgical History:  Procedure Laterality Date  . BLADDER SUSPENSION  07/2010   With cystocele repair, Dr. Amalia Hailey  . ELBOW SURGERY Right 08/2011  . HYSTERECTOMY ABDOMINAL WITH SALPINGO-OOPHORECTOMY  01/1996  . SPINAL FUSION  10/2010   C5-C6, C6-C7, Dr. Ellene Route  . TONSILLECTOMY  1969    There were no vitals filed for this visit.  Subjective Assessment - 02/19/18 1121    Subjective  Patient reports she was stepping with her R foot into the shoulder and had severe pain in the L hip this morning that has since subsided to 8/10. Patient reports that following Tuesday's session she felt as though her pain was better for "a  while" but came back Wednesday. Patient reports that she thinks her stretching and using heat at night is helping her some. No questions or concerns at this time.     Pertinent History  Pt is a 61 year old female with history of chronic LBP (multiple years) that she reports has been exacerbated following a fall Feb 2019 where she was standing on a chair to swat at a bug and the chair gave away under her. Pt reports she is currently having pain across bilat LB with pain "deep in L buttock" that she reports is dull and achy. Patient reports worst pain in the past week 7/10; best: 5/10. Patient reports she is having difficulty squatting to play with her 55 year old grandson and standing from seated positon. Patient her pain wakes her up in the middle of the night if she lays on the L side. Pt denies N/V, unexplained weight fluctuation, saddle paresthesia, fever, night sweats, B&B changes or unrelenting night pain at this time.    Limitations  Lifting;House hold activities;Walking    How long can you sit comfortably?  unlimited    How long can you stand comfortably?  unlimited    How long can you walk comfortably?  6min    Diagnostic tests  MRI scheduled "if PT does not clear up symptoms"    Pain Onset  More than a month ago          Manual -STM with trigger point release to bilat lumbar paraspinals (prolonged time spent at L lumbar paraspinals as patient has increased pain and trigger points here) -STM w/ trigger point release to L piriformis and glute max (prolonged time spent here as this is patient's chief complaint today.  -L2-S3 CPA grade I-II 30sec bouts 4 bouts each segment for pain modulation -L2-S3 L UPA grade III-IV 30sec bouts 4 bouts each segment for increased R rotatiton to prevent "catching" sensation -Manual supine lumbar traction 10sec traction w/ 10sec relaxation x10 for pain modulation -Supine L FADIR stretch 4x 45sec holds -Supine R lumbar rotation stretch 4x 45sec holds -Supine  L  FABER with PT overpressure 4x 45sec holds   ESTIM+ heat packHiVolt ESTIM15 min at patient toleratedL lumbar paraspinals 150Vincreased to175V and L piriformis 230Vincreased to260V through treatmentat bilat lumbar paraspinals. PT educated patient on use/precautions/expected outcomes of ESTIM. With PT assessing patient tolerance throughout (increasing intensity as needed), monitoring skin integrity (normal), with decreased pain noted from patient. PT educated patient during this time to continue HEP to maintain long term gains from pain relief with manual + modality techniques. Also, utilized this time to demonstrate and educate patient on self lumbar traction as she responded well to this during manual techniques. PT demonstrated with visual handout and explained the purpose of traction, and following ESTIM had patient demonstrate to ensure understanding.                        PT Education - 02/19/18 1203    Education provided  Yes    Education Details  Self lumbar traction at home    Person(s) Educated  Patient    Methods  Explanation;Verbal cues;Handout    Comprehension  Returned demonstration;Verbal cues required;Verbalized understanding       PT Short Term Goals - 02/10/18 1402      PT SHORT TERM GOAL #1   Title  Pt will be independent with HEP in order to improve strength, flexibility, and balance in order to decrease fall risk and improve function at home and work.    Time  2    Period  Weeks    Status  New        PT Long Term Goals - 02/10/18 1403      PT LONG TERM GOAL #1   Title  Patient will increase FOTO score to 60 to demonstrate predicted increase in functional mobility to complete ADLs    Baseline  02/10/18 54    Time  8    Period  Weeks    Status  New      PT LONG TERM GOAL #2   Title  Pt will decrease mODI scoreby at least 13 points in order demonstrate clinically significant reduction in pain/disability    Baseline  02/10/18 26%     Time  8    Period  Weeks    Status  New      PT LONG TERM GOAL #3   Title  Pt will decrease 5TSTS by at least 3 seconds in order to demonstrate clinically significant improvement in LE strength    Baseline  02/10/18 18sec    Time  8    Period  Weeks    Status  New      PT LONG TERM GOAL #4   Title  Pt will decrease worst pain as reported on NPRS  by at least 3 points in order to demonstrate clinically significant reduction in pain.     Baseline  02/10/18: worst 8/10    Time  8    Period  Weeks    Status  New      PT LONG TERM GOAL #5   Title  Pt will increase hip ext strength by at least 1/2 MMT grade in order to demonstrate improvement in strength and function    Baseline  02/10/18 L 3/5 R 3+/5    Time  8    Period  Weeks    Status  New            Plan - 02/19/18 1225    Clinical Impression Statement  PT utilized extensive manual techniques for pain modulation as patient repoprts increased pain following "catch sensation" when stepping into the shower. Patient presents with cheif complaint and objective increased trigger points and tightness in L paraspinals, with secondary c/o trigger points in L lumbar paraspinals. PT explained to patient that PT believes the tightness in the musculature around the low back and sacrum is causing an imbalanced length/tension relationship, causing some narrowing of space at the lower lumbar vertebrae and SIJ. Following maual + modality techniques patient reports minimal pain with objective full lumbar rotation bilat, and PT encourages patient to continue stretching HEP as well as self lumbar traction to maintain these gains in increased mobility/decreased pain.     Rehab Potential  Good    Clinical Impairments Affecting Rehab Potential  (+) strong social support, motivation (-) chronicity of pain/fibromyalgia sx, psycho-social factors, hx of spine surgery, sedentary lifestyle, age    PT Frequency  2x / week    PT Duration  8 weeks    PT  Treatment/Interventions  ADLs/Self Care Home Management;Electrical Stimulation;Aquatic Therapy;Iontophoresis 4mg /ml Dexamethasone;Moist Heat;Traction;Cryotherapy;Ultrasound;Functional mobility training;Manual techniques;Dry needling;Passive range of motion;Neuromuscular re-education;Balance training;Gait training;Therapeutic exercise;Therapeutic activities;Patient/family education;Energy conservation;Taping    PT Next Visit Plan  Continue lengthening/STM/pain modulation for spinal/hip musculature (focus on L), adding in core strengthening as able.     PT Home Exercise Plan  Self lumbar traction, bridge, modified R lateral child's pose, seated piriformis stretch    Consulted and Agree with Plan of Care  Patient       Patient will benefit from skilled therapeutic intervention in order to improve the following deficits and impairments:  Increased fascial restricitons, Pain, Improper body mechanics, Decreased mobility, Increased muscle spasms, Impaired tone, Postural dysfunction, Decreased endurance, Decreased activity tolerance, Decreased range of motion, Decreased strength, Decreased balance, Difficulty walking, Impaired flexibility  Visit Diagnosis: Chronic right-sided low back pain without sciatica     Problem List Patient Active Problem List   Diagnosis Date Noted  . Depression, major, in remission ([place]) 01/06/2018  . Hyperlipidemia 06/07/2015  . IBS (irritable bowel syndrome) 03/01/2015  . Depression 03/01/2015  . Fibromyalgia 03/01/2015  . Hypothyroid 03/01/2015  . Pain in joint, shoulder region 01/11/2014  . Cervicalgia 01/11/2014  . History of fusion of cervical spine 01/11/2014  . Headache(784.0) 03/29/2013  . Chronic interstitial cystitis 11/05/2012   Shelton Silvas PT, DPT  Shelton Silvas 02/19/2018, 12:32 PM  [place] [facility] [address] [place], [address] Phone: [phone]   Fax:   [phone]  Name: [name] Brown MRN: [id number] Date of Birth: March 05, 1956

## 2018-02-20 LAB — CBC WITH DIFFERENTIAL/PLATELET
BASOS ABS: 0.1 10*3/uL (ref 0.0–0.2)
Basos: 2 %
EOS (ABSOLUTE): 0.7 10*3/uL — ABNORMAL HIGH (ref 0.0–0.4)
EOS: 16 %
HEMOGLOBIN: 12.8 g/dL (ref 11.1–15.9)
Hematocrit: 38.6 % (ref 34.0–46.6)
IMMATURE GRANULOCYTES: 0 %
Immature Grans (Abs): 0 10*3/uL (ref 0.0–0.1)
Lymphocytes Absolute: 1.3 10*3/uL (ref 0.7–3.1)
Lymphs: 28 %
MCH: 29 pg (ref 26.6–33.0)
MCHC: 33.2 g/dL (ref 31.5–35.7)
MCV: 88 fL (ref 79–97)
MONOCYTES: 6 %
MONOS ABS: 0.3 10*3/uL (ref 0.1–0.9)
NEUTROS PCT: 48 %
Neutrophils Absolute: 2.2 10*3/uL (ref 1.4–7.0)
Platelets: 263 10*3/uL (ref 150–450)
RBC: 4.41 x10E6/uL (ref 3.77–5.28)
RDW: 14.3 % (ref 12.3–15.4)
WBC: 4.6 10*3/uL (ref 3.4–10.8)

## 2018-02-20 LAB — LIPID PANEL
CHOLESTEROL TOTAL: 197 mg/dL (ref 100–199)
Chol/HDL Ratio: 3.1 ratio (ref 0.0–4.4)
HDL: 64 mg/dL (ref >39)
LDL CALC: 117 mg/dL — AB (ref 0–99)
TRIGLYCERIDES: 80 mg/dL (ref 0–149)
VLDL Cholesterol Cal: 16 mg/dL (ref 5–40)

## 2018-02-20 LAB — TSH: TSH: 2.12 u[IU]/mL (ref 0.450–4.500)

## 2018-02-20 LAB — COMPREHENSIVE METABOLIC PANEL
ALK PHOS: 77 IU/L (ref 39–117)
ALT: 20 IU/L (ref 0–32)
AST: 18 IU/L (ref 0–40)
Albumin/Globulin Ratio: 1.5 (ref 1.2–2.2)
Albumin: 3.9 g/dL (ref 3.6–4.8)
BUN/Creatinine Ratio: 22 (ref 12–28)
BUN: 16 mg/dL (ref 8–27)
Bilirubin Total: 0.2 mg/dL (ref 0.0–1.2)
CALCIUM: 9.2 mg/dL (ref 8.7–10.3)
CO2: 22 mmol/L (ref 20–29)
CREATININE: 0.72 mg/dL (ref 0.57–1.00)
Chloride: 105 mmol/L (ref 96–106)
GFR calc Af Amer: 105 mL/min/{1.73_m2} (ref >59)
GFR, EST NON AFRICAN AMERICAN: 91 mL/min/{1.73_m2} (ref >59)
GLOBULIN, TOTAL: 2.6 g/dL (ref 1.5–4.5)
GLUCOSE: 96 mg/dL (ref 65–99)
Potassium: 4.2 mmol/L (ref 3.5–5.2)
Sodium: 140 mmol/L (ref 134–144)
Total Protein: 6.5 g/dL (ref 6.0–8.5)

## 2018-02-25 ENCOUNTER — Ambulatory Visit (INDEPENDENT_AMBULATORY_CARE_PROVIDER_SITE_OTHER): Payer: PPO | Admitting: Family Medicine

## 2018-02-25 ENCOUNTER — Encounter: Payer: Self-pay | Admitting: Family Medicine

## 2018-02-25 VITALS — BP 110/72 | HR 98 | Temp 98.5°F | Resp 14 | Wt 136.0 lb

## 2018-02-25 DIAGNOSIS — R05 Cough: Secondary | ICD-10-CM | POA: Diagnosis not present

## 2018-02-25 DIAGNOSIS — G8929 Other chronic pain: Secondary | ICD-10-CM

## 2018-02-25 DIAGNOSIS — R059 Cough, unspecified: Secondary | ICD-10-CM

## 2018-02-25 DIAGNOSIS — E7849 Other hyperlipidemia: Secondary | ICD-10-CM

## 2018-02-25 DIAGNOSIS — J40 Bronchitis, not specified as acute or chronic: Secondary | ICD-10-CM | POA: Diagnosis not present

## 2018-02-25 DIAGNOSIS — M5440 Lumbago with sciatica, unspecified side: Secondary | ICD-10-CM

## 2018-02-25 MED ORDER — AZITHROMYCIN 250 MG PO TABS: ORAL_TABLET | ORAL | 0 refills | Status: DC

## 2018-02-25 NOTE — Patient Instructions (Addendum)
Use OTC Robitussin for cough. Get antibiotic from pharmacy. Call if not better.

## 2018-02-25 NOTE — Progress Notes (Signed)
Patient: [name] Brown Female    DOB: Nov 01, 1955   62 y.o.   MRN: [id number] Visit Date: 02/25/2018  Today's Provider: Wilhemena Durie, MD   Chief Complaint  Patient presents with  . Hyperlipidemia   Subjective:    HPI Hyperlipidemia- pt is here for a 1 month follow up after starting Zetia. Her Lipids were better at her recent last check. No side effects that she can tell now, at first she had a "weird feeling" but went away after about 2 days.   Back pain- pt is doing PT and seems to doing ok with it. She has her good days and bad days and she can tell when she has over done it. She is seeing the neurosurgeon in July.   Cough- she has been seeing ENT for sinus infections on and off but she has a cough that is sometimes productive. No shortness of breath      Allergies  Allergen Reactions  . Codeine     hallucination  . Hydrocodone   . Levofloxacin Other (See Comments)  . Minocin [Minocycline]   . Nsaids Other (See Comments)    Other reaction(s): OTHER Other reaction(s): OTHER     Current Outpatient Medications:  .  amitriptyline (ELAVIL) 25 MG tablet, TAKE ONE TABLET BY MOUTH AT BEDTIME, Disp: 90 tablet, Rfl: 3 .  cyclobenzaprine (FLEXERIL) 10 MG tablet, TAKE 1 TABLET BY MOUTH AT BEDTIME, Disp: 30 tablet, Rfl: 5 .  DULoxetine (CYMBALTA) 60 MG capsule, TAKE 1 CAPSULE BY MOUTH ONCE DAILY, Disp: 90 capsule, Rfl: 1 .  ezetimibe (ZETIA) 10 MG tablet, Take 1 tablet (10 mg total) by mouth daily., Disp: 90 tablet, Rfl: 3 .  lansoprazole (PREVACID) 30 MG capsule, Take 20 mg by mouth. , Disp: , Rfl:  .  Magnesium 500 MG CAPS, Take 1 capsule by mouth at bedtime., Disp: , Rfl:  .  polyethylene glycol (MIRALAX / GLYCOLAX) packet, Take 17 g by mouth daily., Disp: , Rfl:  .  psyllium (REGULOID) 0.52 g capsule, Take 0.52 g by mouth daily., Disp: , Rfl:  .  diazepam (VALIUM) 2 MG tablet, Take 1 tablet (2 mg total) by mouth every 12 (twelve) hours as needed for anxiety. (Patient  not taking: Reported on 01/27/2018), Disp: 30 tablet, Rfl: 1 .  levothyroxine (SYNTHROID, LEVOTHROID) 25 MCG tablet, TAKE 1 TABLET BY MOUTH ONCE DAILY BEFORE BREAKFAST, Disp: 30 tablet, Rfl: 12 .  naproxen (NAPROSYN) 375 MG tablet, Take 1 tablet (375 mg total) by mouth 2 (two) times daily as needed. (Patient not taking: Reported on 01/27/2018), Disp: 60 tablet, Rfl: 3  Review of Systems  Constitutional: Negative.   HENT: Negative.   Eyes: Negative.   Respiratory: Positive for cough.   Cardiovascular: Negative.   Gastrointestinal: Negative.   Endocrine: Negative.   Genitourinary: Negative.   Musculoskeletal: Positive for back pain.  Skin: Negative.   Allergic/Immunologic: Negative.   Neurological: Negative.   Hematological: Negative.   Psychiatric/Behavioral: Negative.     Social History   Tobacco Use  . Smoking status: Never Smoker  . Smokeless tobacco: Never Used  Substance Use Topics  . Alcohol use: Yes    Comment: 1 every 2 weeks   Objective:   BP 110/72 (BP Location: Left Arm, Patient Position: Sitting, Cuff Size: Normal)   Pulse 98   Temp 98.5 F (36.9 C) (Oral)   Resp 14   Wt 136 lb (61.7 kg)   LMP 01/28/1997  BMI 27.01 kg/m  Vitals:   02/25/18 1119  BP: 110/72  Pulse: 98  Resp: 14  Temp: 98.5 F (36.9 C)  TempSrc: Oral  Weight: 136 lb (61.7 kg)     Physical Exam  Constitutional: She is oriented to person, place, and time. She appears well-developed and well-nourished.  HENT:  Head: Normocephalic and atraumatic.  Eyes: Conjunctivae are normal. No scleral icterus.  Neck: No thyromegaly present.  Cardiovascular: Normal rate, regular rhythm and normal heart sounds.  Pulmonary/Chest: Effort normal and breath sounds normal.  Abdominal: Soft.  Musculoskeletal: She exhibits no edema.  Neurological: She is alert and oriented to person, place, and time.  Skin: Skin is warm and dry.  Psychiatric: She has a normal mood and affect. Her behavior is normal.  Judgment and thought content normal.        Assessment & Plan:     1. Chronic low back pain with sciatica, sciatica laterality unspecified, unspecified back pain laterality   2. Other hyperlipidemia RTC 5-6 months.  3. Cough Robitussin  4. Bronchitis  - azithromycin (ZITHROMAX) 250 MG tablet; Take 2 tablets on the first day then 1 daily until finished.  Dispense: 6 tablet; Refill: 0 5.Fibromyalgia     I have done the exam and reviewed the above chart and it is accurate to the best of my knowledge. Development worker, community has been used in this note in any air is in the dictation or transcription are unintentional.  Wilhemena Durie, MD  [place]

## 2018-03-02 ENCOUNTER — Encounter: Payer: PPO | Admitting: Physical Therapy

## 2018-03-03 ENCOUNTER — Ambulatory Visit: Payer: PPO | Attending: Family Medicine | Admitting: Physical Therapy

## 2018-03-03 ENCOUNTER — Encounter: Payer: Self-pay | Admitting: Physical Therapy

## 2018-03-03 DIAGNOSIS — M545 Low back pain, unspecified: Secondary | ICD-10-CM

## 2018-03-03 DIAGNOSIS — G8929 Other chronic pain: Secondary | ICD-10-CM | POA: Diagnosis not present

## 2018-03-03 NOTE — Therapy (Signed)
[facility] [address] [place], [address] Phone: [phone]   Fax:  [phone]  Physical Therapy Treatment  Patient Details  Name: [name] Brown MRN: [id number] Date of Birth: 1956/03/16 Referring Provider: Miguel Aschoff   Encounter Date: 03/03/2018  PT End of Session - 03/03/18 1350    Visit Number  5    Number of Visits  17    PT Start Time  5631    PT Stop Time  1437    PT Time Calculation (min)  48 min    Activity Tolerance  Patient tolerated treatment well    Behavior During Therapy  [facility] for tasks assessed/performed       Past Medical History:  Diagnosis Date  . Anxiety   . Cervical spondylosis without myelopathy   . Cervicalgia 01/11/2014  . Degeneration of cervical intervertebral disc   . Endometriosis   . Headache(784.0)   . High cholesterol   . History of fusion of cervical spine 01/11/2014  . IC (interstitial cystitis)   . Myalgia and myositis, unspecified   . Osteoarthrosis, unspecified whether generalized or localized, unspecified site   . Other and unspecified hyperlipidemia   . Pain in joint, shoulder region 01/11/2014  . Pain in joint, upper arm   . Squamous cell carcinoma    [facility]- place on chest  . Unspecified disorder of bladder     Past Surgical History:  Procedure Laterality Date  . BLADDER SUSPENSION  07/2010   With cystocele repair, Dr. Amalia Hailey  . ELBOW SURGERY Right 08/2011  . HYSTERECTOMY ABDOMINAL WITH SALPINGO-OOPHORECTOMY  01/1996  . SPINAL FUSION  10/2010   C5-C6, C6-C7, Dr. Ellene Route  . TONSILLECTOMY  1969    There were no vitals filed for this visit.  Subjective Assessment - 03/03/18 1352    Subjective  Pt reports she felt good the day following last session.  Around 5am this morning the pt experienced pain that "took my breath away", pain 9/10.  Pt reports this pain woke her up.  Pain was in her L buttocks and low back region.  Pt reports she  used her heating pad and propped a pillow under her knees which allowed her to sleep for another hour.      Pertinent History  Pt is a 62 year old female with history of chronic LBP (multiple years) that she reports has been exacerbated following a fall Feb 2019 where she was standing on a chair to swat at a bug and the chair gave away under her. Pt reports she is currently having pain across bilat LB with pain "deep in L buttock" that she reports is dull and achy. Patient reports worst pain in the past week 7/10; best: 5/10. Patient reports she is having difficulty squatting to play with her 77 year old grandson and standing from seated positon. Patient her pain wakes her up in the middle of the night if she lays on the L side. Pt denies N/V, unexplained weight fluctuation, saddle paresthesia, fever, night sweats, B&B changes or unrelenting night pain at this time.    Limitations  Lifting;House hold activities;Walking    How long can you sit comfortably?  unlimited    How long can you stand comfortably?  unlimited    How long can you walk comfortably?  17min    Diagnostic tests  MRI scheduled "if PT does not clear up symptoms"    Currently in Pain?  Yes  Pain Score  8     Pain Location  Back    Pain Orientation  Left;Lower    Pain Descriptors / Indicators  Throbbing    Pain Type  Chronic pain    Pain Onset  More than a month ago        TREATMENT    Manual   -STM with trigger point release to bilat lumbar paraspinals (prolonged time spent at L lumbar paraspinals as patient has increased pain and trigger points here)   -STM w/ trigger point release to L piriformis and glute max with significant trigger points noted   -L2-S1 CPA grade I-II 30sec bouts 4 bouts each segment for pain modulation   -L2-S1?L UPA grade III-IV?30sec bouts 4 bouts each segment for increased R rotation to prevent "catching" sensation   -Manual supine lumbar traction 20sec traction w/ 10sec relaxation x5 for pain  modulation   -Supine R lumbar rotation stretch x10 each direction     ESTIM + heat pack HiVolt ESTIM 15 min L lumbar paraspinals 150V. PT educated patient on use/precautions/expected outcomes of ESTIM. With PT assessing patient tolerance throughout (increasing intensity as needed), monitoring skin integrity (normal), with decreased pain noted from patient. PT took this time to encourage pt to continue with her HEP. Discussed positioning when sleeping with proper pillow placement.                         PT Education - 03/03/18 1350    Education provided  Yes    Education Details  Exercise technique    Person(s) Educated  Patient    Methods  Explanation;Demonstration;Verbal cues    Comprehension  Verbalized understanding;Returned demonstration;Verbal cues required;Need further instruction       PT Short Term Goals - 02/10/18 1402      PT SHORT TERM GOAL #1   Title  Pt will be independent with HEP in order to improve strength, flexibility, and balance in order to decrease fall risk and improve function at home and work.    Time  2    Period  Weeks    Status  New        PT Long Term Goals - 02/10/18 1403      PT LONG TERM GOAL #1   Title  Patient will increase FOTO score to 60 to demonstrate predicted increase in functional mobility to complete ADLs    Baseline  02/10/18 54    Time  8    Period  Weeks    Status  New      PT LONG TERM GOAL #2   Title  Pt will decrease mODI scoreby at least 13 points in order demonstrate clinically significant reduction in pain/disability    Baseline  02/10/18 26%    Time  8    Period  Weeks    Status  New      PT LONG TERM GOAL #3   Title  Pt will decrease 5TSTS by at least 3 seconds in order to demonstrate clinically significant improvement in LE strength    Baseline  02/10/18 18sec    Time  8    Period  Weeks    Status  New      PT LONG TERM GOAL #4   Title  Pt will decrease worst pain as reported on NPRS by at  least 3 points in order to demonstrate clinically significant reduction in pain.     Baseline  02/10/18: worst  8/10    Time  8    Period  Weeks    Status  New      PT LONG TERM GOAL #5   Title  Pt will increase hip ext strength by at least 1/2 MMT grade in order to demonstrate improvement in strength and function    Baseline  02/10/18 L 3/5 R 3+/5    Time  8    Period  Weeks    Status  New            Plan - 03/03/18 1356    Clinical Impression Statement  Pt responded well to manual techniques and reports an easing of her pain following manual techniques and stretching.  Pt reports some relief following estim and use of a heating pad.  Pt will benefit from continued skilled PT interventions for decreased pain and improved QOL.      Rehab Potential  Good    Clinical Impairments Affecting Rehab Potential  (+) strong social support, motivation (-) chronicity of pain/fibromyalgia sx, psycho-social factors, hx of spine surgery, sedentary lifestyle, age    PT Frequency  2x / week    PT Duration  8 weeks    PT Treatment/Interventions  ADLs/Self Care Home Management;Electrical Stimulation;Aquatic Therapy;Iontophoresis 4mg /ml Dexamethasone;Moist Heat;Traction;Cryotherapy;Ultrasound;Functional mobility training;Manual techniques;Dry needling;Passive range of motion;Neuromuscular re-education;Balance training;Gait training;Therapeutic exercise;Therapeutic activities;Patient/family education;Energy conservation;Taping    PT Next Visit Plan  Continue lengthening/STM/pain modulation for spinal/hip musculature (focus on L), adding in core strengthening as able.     PT Home Exercise Plan  Self lumbar traction, bridge, modified R lateral child's pose, seated piriformis stretch    Consulted and Agree with Plan of Care  Patient       Patient will benefit from skilled therapeutic intervention in order to improve the following deficits and impairments:  Increased fascial restricitons, Pain, Improper body  mechanics, Decreased mobility, Increased muscle spasms, Impaired tone, Postural dysfunction, Decreased endurance, Decreased activity tolerance, Decreased range of motion, Decreased strength, Decreased balance, Difficulty walking, Impaired flexibility  Visit Diagnosis: Chronic right-sided low back pain without sciatica     Problem List Patient Active Problem List   Diagnosis Date Noted  . Depression, major, in remission ([place]) 01/06/2018  . Hyperlipidemia 06/07/2015  . IBS (irritable bowel syndrome) 03/01/2015  . Depression 03/01/2015  . Fibromyalgia 03/01/2015  . Hypothyroid 03/01/2015  . Pain in joint, shoulder region 01/11/2014  . Cervicalgia 01/11/2014  . History of fusion of cervical spine 01/11/2014  . Headache(784.0) 03/29/2013  . Chronic interstitial cystitis 11/05/2012    Collie Siad PT, DPT 03/03/2018, 2:32 PM  [facility] [address] [place], [address] Phone: [phone]   Fax:  [phone]  Name: [name] Brown MRN: [id number] Date of Birth: Dec 27, 1955

## 2018-03-04 ENCOUNTER — Encounter: Payer: Self-pay | Admitting: Physical Therapy

## 2018-03-04 ENCOUNTER — Ambulatory Visit: Payer: PPO | Admitting: Physical Therapy

## 2018-03-04 DIAGNOSIS — M545 Low back pain, unspecified: Secondary | ICD-10-CM

## 2018-03-04 DIAGNOSIS — G8929 Other chronic pain: Secondary | ICD-10-CM

## 2018-03-04 NOTE — Therapy (Signed)
[facility] [address] [place], [address] Phone: [phone]   Fax:  [phone]  Physical Therapy Treatment  Patient Details  Name: [name] Brown MRN: [id number] Date of Birth: [birth year] Referring Provider: Miguel Aschoff   Encounter Date: 03/04/2018  PT End of Session - 03/04/18 1152    Visit Number  6    Number of Visits  17    PT Start Time  1601    PT Stop Time  1205    PT Time Calculation (min)  50 min    Activity Tolerance  Patient tolerated treatment well    Behavior During Therapy  [facility] for tasks assessed/performed       Past Medical History:  Diagnosis Date  . Anxiety   . Cervical spondylosis without myelopathy   . Cervicalgia 01/11/2014  . Degeneration of cervical intervertebral disc   . Endometriosis   . Headache(784.0)   . High cholesterol   . History of fusion of cervical spine 01/11/2014  . IC (interstitial cystitis)   . Myalgia and myositis, unspecified   . Osteoarthrosis, unspecified whether generalized or localized, unspecified site   . Other and unspecified hyperlipidemia   . Pain in joint, shoulder region 01/11/2014  . Pain in joint, upper arm   . Squamous cell carcinoma    [facility]- place on chest  . Unspecified disorder of bladder     Past Surgical History:  Procedure Laterality Date  . BLADDER SUSPENSION  07/2010   With cystocele repair, Dr. Amalia Hailey  . ELBOW SURGERY Right 08/2011  . HYSTERECTOMY ABDOMINAL WITH SALPINGO-OOPHORECTOMY  01/1996  . SPINAL FUSION  10/2010   C5-C6, C6-C7, Dr. Ellene Route  . TONSILLECTOMY  1969    There were no vitals filed for this visit.  Subjective Assessment - 03/04/18 1119    Subjective  Patient reports no pain following yesterday's session. Patient reports that the episode of pain she reported last sessio nis the only pain she has had that been at this level of intensity. Patient reports her pain in the buttock is more nagging  and  not as frequent at this point and that the sharp pain is localized to bilat lumbar paraspinals at this time. Patient reports she feels as though yesterday's session helped, but that she is still having some lingering pain from the episode of sharp pain in the middle of the night early Tuesday am.     Pertinent History  Pt is a 62 year old female with history of chronic LBP (multiple years) that she reports has been exacerbated following a fall Feb 2019 where she was standing on a chair to swat at a bug and the chair gave away under her. Pt reports she is currently having pain across bilat LB with pain "deep in L buttock" that she reports is dull and achy. Patient reports worst pain in the past week 7/10; best: 5/10. Patient reports she is having difficulty squatting to play with her 62 year old grandson and standing from seated positon. Patient her pain wakes her up in the middle of the night if she lays on the L side. Pt denies N/V, unexplained weight fluctuation, saddle paresthesia, fever, night sweats, B&B changes or unrelenting night pain at this time.    Limitations  Lifting;House hold activities;Walking    How long can you sit comfortably?  unlimited    How long can you stand comfortably?  unlimited    How long can you  walk comfortably?  17min    Diagnostic tests  MRI scheduled "if PT does not clear up symptoms"    Pain Onset  More than a month ago           Manual -STM withtrigger point releaseto bilat lumbar paraspinals (prolonged time spent bilat as patient has symmetrical increased pain and trigger points here). Following manual techniques: Dry needling (4) 75mm .30 needles placed along bilat trigger points in lumbar paraspinals, utilizing shelving technique, d/t increased muscular spasms and trigger points with the patient positioned in supine. Patient was educated on risks and benefits of therapy and verbally consents to PT.  -STM w/trigger point releaseto L piriformis and glute  max (less time spent here than previous sessions d/t decreased tension/pain response in this area)   ESTIM+ heat packHiVolt ESTIM15 min at patient toleratedL lumbar paraspinals160Vand R lumbar paraspinals160Vincreased to165V through treatmentat bilat lumbar paraspinals.With PT assessing patient tolerance throughout (increasing intensity as needed), monitoring skin integrity (normal), with decreased pain noted from patient. PT educated patient during this time to continue HEP to maintain long term gains from pain relief with manual + modality techniques.   Ther-Activities -Posterior pelvic tilt (utilized as patient is unable to properly contract core with lumbar spine/pelvic neutral in standing for proper posture) with PT giving TC to "push low back into mat table" and VC to "draw belly button in". Following patient understanding of posterior pelvic tilt and core activation, attempted in sitting and standing. Pt required prolonged cuing in order to accomplish this, but was able to identify proper pelvic and lumbar neutral in all positions by the end of session. Pt was also able to carry this over to proper squat form. PT encouraged patient to continue this core activation and posture as able, and educated patient on ant/post length/tension relationship between paraspinals/glutes and core.                          PT Education - 03/04/18 1150    Education provided  Yes    Education Details  Exercise technique, postural education, squatting education    Person(s) Educated  Patient    Methods  Explanation;Verbal cues;Tactile cues;Demonstration    Comprehension  Verbalized understanding;Returned demonstration;Verbal cues required       PT Short Term Goals - 02/10/18 1402      PT SHORT TERM GOAL #1   Title  Pt will be independent with HEP in order to improve strength, flexibility, and balance in order to decrease fall risk and improve function at home and work.     Time  2    Period  Weeks    Status  New        PT Long Term Goals - 02/10/18 1403      PT LONG TERM GOAL #1   Title  Patient will increase FOTO score to 60 to demonstrate predicted increase in functional mobility to complete ADLs    Baseline  02/10/18 54    Time  8    Period  Weeks    Status  New      PT LONG TERM GOAL #2   Title  Pt will decrease mODI scoreby at least 13 points in order demonstrate clinically significant reduction in pain/disability    Baseline  02/10/18 26%    Time  8    Period  Weeks    Status  New      PT LONG TERM GOAL #3  Title  Pt will decrease 5TSTS by at least 3 seconds in order to demonstrate clinically significant improvement in LE strength    Baseline  02/10/18 18sec    Time  8    Period  Weeks    Status  New      PT LONG TERM GOAL #4   Title  Pt will decrease worst pain as reported on NPRS by at least 3 points in order to demonstrate clinically significant reduction in pain.     Baseline  02/10/18: worst 8/10    Time  8    Period  Weeks    Status  New      PT LONG TERM GOAL #5   Title  Pt will increase hip ext strength by at least 1/2 MMT grade in order to demonstrate improvement in strength and function    Baseline  02/10/18 L 3/5 R 3+/5    Time  8    Period  Weeks    Status  New            Plan - 03/04/18 1234    Clinical Impression Statement  Pt is continuing to respond well to manual techniques with PT introducing dry needling for trigger points today. Patient reponded well to all treatment for soft tissue release, with no increased pain, and noted decreased tension following. Following soft tissue techniques, PT initiated core activation and tied this into proper posture and squatting mechanics. After prolonged cuing and working through proper pelvic/lumbar spine alignment in supine, patient was able to progress to sitting and standing with accuracy following increased cuing.     Rehab Potential  Good    Clinical Impairments  Affecting Rehab Potential  (+) strong social support, motivation (-) chronicity of pain/fibromyalgia sx, psycho-social factors, hx of spine surgery, sedentary lifestyle, age    PT Frequency  2x / week    PT Duration  8 weeks    PT Treatment/Interventions  ADLs/Self Care Home Management;Electrical Stimulation;Aquatic Therapy;Iontophoresis 4mg /ml Dexamethasone;Moist Heat;Traction;Cryotherapy;Ultrasound;Functional mobility training;Manual techniques;Dry needling;Passive range of motion;Neuromuscular re-education;Balance training;Gait training;Therapeutic exercise;Therapeutic activities;Patient/family education;Energy conservation;Taping    PT Next Visit Plan  Continue lengthening/STM/pain modulation for spinal/hip musculature (focus on L), adding in core strengthening as able.     PT Home Exercise Plan  Self lumbar traction, bridge, modified R lateral child's pose, seated piriformis stretch    Consulted and Agree with Plan of Care  Patient       Patient will benefit from skilled therapeutic intervention in order to improve the following deficits and impairments:  Increased fascial restricitons, Pain, Improper body mechanics, Decreased mobility, Increased muscle spasms, Impaired tone, Postural dysfunction, Decreased endurance, Decreased activity tolerance, Decreased range of motion, Decreased strength, Decreased balance, Difficulty walking, Impaired flexibility  Visit Diagnosis: Chronic right-sided low back pain without sciatica  Acute left-sided low back pain without sciatica     Problem List Patient Active Problem List   Diagnosis Date Noted  . Depression, major, in remission ([place]) 01/06/2018  . Hyperlipidemia 06/07/2015  . IBS (irritable bowel syndrome) 03/01/2015  . Depression 03/01/2015  . Fibromyalgia 03/01/2015  . Hypothyroid 03/01/2015  . Pain in joint, shoulder region 01/11/2014  . Cervicalgia 01/11/2014  . History of fusion of cervical spine 01/11/2014  . Headache(784.0)  03/29/2013  . Chronic interstitial cystitis 11/05/2012   Shelton Silvas PT, DPT Shelton Silvas 03/04/2018, 12:57 PM  [facility] [address], [place], [address] Phone: [phone]   Fax:  [phone]  Name: TENNIE GRUSSING MRN: [id number] Date of Birth: Apr 06, 1956

## 2018-03-09 ENCOUNTER — Encounter: Payer: Self-pay | Admitting: Physical Therapy

## 2018-03-09 ENCOUNTER — Ambulatory Visit: Payer: PPO | Admitting: Physical Therapy

## 2018-03-09 DIAGNOSIS — G8929 Other chronic pain: Secondary | ICD-10-CM

## 2018-03-09 DIAGNOSIS — M545 Low back pain, unspecified: Secondary | ICD-10-CM

## 2018-03-09 NOTE — Therapy (Signed)
[facility] [address] [place], [address] Phone: [phone]   Fax:  [phone]  Physical Therapy Treatment  Patient Details  Name: [name] Brown MRN: [id number] Date of Birth: 1956-01-12 Referring Provider: Miguel Aschoff   Encounter Date: 03/09/2018  PT End of Session - 03/09/18 1346    Visit Number  7    Number of Visits  17    PT Start Time  0100    PT Stop Time  0145    PT Time Calculation (min)  45 min    Activity Tolerance  Patient tolerated treatment well    Behavior During Therapy  [facility] for tasks assessed/performed       Past Medical History:  Diagnosis Date  . Anxiety   . Cervical spondylosis without myelopathy   . Cervicalgia 01/11/2014  . Degeneration of cervical intervertebral disc   . Endometriosis   . Headache(784.0)   . High cholesterol   . History of fusion of cervical spine 01/11/2014  . IC (interstitial cystitis)   . Myalgia and myositis, unspecified   . Osteoarthrosis, unspecified whether generalized or localized, unspecified site   . Other and unspecified hyperlipidemia   . Pain in joint, shoulder region 01/11/2014  . Pain in joint, upper arm   . Squamous cell carcinoma    [facility]- place on chest  . Unspecified disorder of bladder     Past Surgical History:  Procedure Laterality Date  . BLADDER SUSPENSION  07/2010   With cystocele repair, Dr. Amalia Hailey  . ELBOW SURGERY Right 08/2011  . HYSTERECTOMY ABDOMINAL WITH SALPINGO-OOPHORECTOMY  01/1996  . SPINAL FUSION  10/2010   C5-C6, C6-C7, Dr. Ellene Route  . TONSILLECTOMY  1969    There were no vitals filed for this visit.  Subjective Assessment - 03/09/18 1304    Subjective  Patient reports she had no pain following dry needling for the "first time in a year", and had no soreness following; she is very excited about this. She reports she started feeling a "little bit of pain coming back" this morning in L  paraspinals and L buttock. Patient reports having some soreness in bilat UT as well. Patient reports compliance with her HEP as well.     Pertinent History  Pt is a 62 year old female with history of chronic LBP (multiple years) that she reports has been exacerbated following a fall Feb 2019 where she was standing on a chair to swat at a bug and the chair gave away under her. Pt reports she is currently having pain across bilat LB with pain "deep in L buttock" that she reports is dull and achy. Patient reports worst pain in the past week 7/10; best: 5/10. Patient reports she is having difficulty squatting to play with her 55 year old grandson and standing from seated positon. Patient her pain wakes her up in the middle of the night if she lays on the L side. Pt denies N/V, unexplained weight fluctuation, saddle paresthesia, fever, night sweats, B&B changes or unrelenting night pain at this time.    Limitations  Lifting;House hold activities;Walking    How long can you sit comfortably?  unlimited    How long can you stand comfortably?  unlimited    How long can you walk comfortably?  61min    Diagnostic tests  MRI scheduled "if PT does not clear up symptoms"    Pain Onset  More than a month ago  Manual -STM withtrigger point releaseto bilat lumbar paraspinals (prolonged time spent bilat as patient has symmetrical increased pain and trigger points here). Following manual techniques: Dry needling (4) 1mm .30 needles placed along bilat trigger points in lumbar paraspinals, utilizing shelving technique, d/t increased muscular spasms and trigger points with the patient positioned in prone. Patient was educated on risks and benefits of therapy and verbally consents to PT.  -STM withtrigger point releaseto bilat UT . Following manual techniques: Dry needling (4) 67mm .30 needles placed along bilat trigger points in lumbar paraspinals, utilizing shelving technique, d/t increased  muscular spasms and trigger points with the patient positioned in supine. -STM w/trigger point releaseto L piriformis and glute max(less time spent here than previous sessions d/t decreased tension/pain response in this area)   ESTIM+ heat packHiVolt ESTIM10 min at patient toleratedL lumbar paraspinals160Vto 170V on LandR lumbar paraspinals160Vincreased to165V through treatmentat bilat lumbar paraspinals.With PT assessing patient tolerance throughout (increasing intensity as needed), monitoring skin integrity (normal), with decreased pain noted from patient. PT educated on levator and UT stretch during this time with visual aid and educated on continuing this to maintain PT muscle lengthening gains made through dry needling techniques. Encouraged HEP compliance of paraspinal stretching for this reason as well. Reviewed proper posture with posterior pelvic tilt with demo during this time too  Ther-Ex -Following ESTIM had patient complete demo of UT and levator stretch with 30sec holds to each side and posterior pelvic tilt in sitting to ensure understanding of education given during ESTIM treatment. Patient able to complete with minimal cuing.                 PT Education - 03/09/18 1343    Education provided  Yes    Education Details  Postural education; exercise form    Person(s) Educated  Patient    Methods  Explanation;Demonstration;Tactile cues;Verbal cues;Handout    Comprehension  Verbal cues required;Returned demonstration;Verbalized understanding       PT Short Term Goals - 02/10/18 1402      PT SHORT TERM GOAL #1   Title  Pt will be independent with HEP in order to improve strength, flexibility, and balance in order to decrease fall risk and improve function at home and work.    Time  2    Period  Weeks    Status  New        PT Long Term Goals - 02/10/18 1403      PT LONG TERM GOAL #1   Title  Patient will increase FOTO score to 60 to demonstrate  predicted increase in functional mobility to complete ADLs    Baseline  02/10/18 54    Time  8    Period  Weeks    Status  New      PT LONG TERM GOAL #2   Title  Pt will decrease mODI scoreby at least 13 points in order demonstrate clinically significant reduction in pain/disability    Baseline  02/10/18 26%    Time  8    Period  Weeks    Status  New      PT LONG TERM GOAL #3   Title  Pt will decrease 5TSTS by at least 3 seconds in order to demonstrate clinically significant improvement in LE strength    Baseline  02/10/18 18sec    Time  8    Period  Weeks    Status  New      PT LONG TERM GOAL #4  Title  Pt will decrease worst pain as reported on NPRS by at least 3 points in order to demonstrate clinically significant reduction in pain.     Baseline  02/10/18: worst 8/10    Time  8    Period  Weeks    Status  New      PT LONG TERM GOAL #5   Title  Pt will increase hip ext strength by at least 1/2 MMT grade in order to demonstrate improvement in strength and function    Baseline  02/10/18 L 3/5 R 3+/5    Time  8    Period  Weeks    Status  New            Plan - 03/09/18 1400    Clinical Impression Statement  Pt is continuing to respond well to manual therapy with dry needling adjunct, with PT encouraging stretching to maintain muscle lengthening gains. Patient demonstrated good carry over of posture with neutral pelvis and pelvic tilt core contraction from previous session. Pt will continue to benefit from skilled PT services for pain modulation and hip/core stability    Rehab Potential  Good    Clinical Impairments Affecting Rehab Potential  (+) strong social support, motivation (-) chronicity of pain/fibromyalgia sx, psycho-social factors, hx of spine surgery, sedentary lifestyle, age    PT Frequency  2x / week    PT Duration  8 weeks    PT Treatment/Interventions  ADLs/Self Care Home Management;Electrical Stimulation;Aquatic Therapy;Iontophoresis 4mg /ml  Dexamethasone;Moist Heat;Traction;Cryotherapy;Ultrasound;Functional mobility training;Manual techniques;Dry needling;Passive range of motion;Neuromuscular re-education;Balance training;Gait training;Therapeutic exercise;Therapeutic activities;Patient/family education;Energy conservation;Taping    PT Next Visit Plan  Continue lengthening/STM/pain modulation for spinal/hip musculature (focus on L), adding in core strengthening as able.     PT Home Exercise Plan  Self lumbar traction, bridge, modified R lateral child's pose, seated piriformis stretch    Consulted and Agree with Plan of Care  Patient       Patient will benefit from skilled therapeutic intervention in order to improve the following deficits and impairments:  Increased fascial restricitons, Pain, Improper body mechanics, Decreased mobility, Increased muscle spasms, Impaired tone, Postural dysfunction, Decreased endurance, Decreased activity tolerance, Decreased range of motion, Decreased strength, Decreased balance, Difficulty walking, Impaired flexibility  Visit Diagnosis: Acute left-sided low back pain without sciatica  Chronic right-sided low back pain without sciatica     Problem List Patient Active Problem List   Diagnosis Date Noted  . Depression, major, in remission ([place]) 01/06/2018  . Hyperlipidemia 06/07/2015  . IBS (irritable bowel syndrome) 03/01/2015  . Depression 03/01/2015  . Fibromyalgia 03/01/2015  . Hypothyroid 03/01/2015  . Pain in joint, shoulder region 01/11/2014  . Cervicalgia 01/11/2014  . History of fusion of cervical spine 01/11/2014  . Headache(784.0) 03/29/2013  . Chronic interstitial cystitis 11/05/2012   Shelton Silvas PT, DPT Shelton Silvas 03/09/2018, 2:12 PM  [facility] [address] [place], [address] Phone: [phone]   Fax:  [phone]  Name: [name] Brown MRN: [id number] Date of Birth: 01-03-1956

## 2018-03-11 ENCOUNTER — Ambulatory Visit: Payer: PPO | Admitting: Physical Therapy

## 2018-03-11 ENCOUNTER — Encounter: Payer: Self-pay | Admitting: Physical Therapy

## 2018-03-11 DIAGNOSIS — M545 Low back pain, unspecified: Secondary | ICD-10-CM

## 2018-03-11 DIAGNOSIS — G8929 Other chronic pain: Secondary | ICD-10-CM

## 2018-03-11 NOTE — Therapy (Signed)
[facility] [address] [place], [address] Phone: [phone]   Fax:  [phone]  Physical Therapy Treatment  Patient Details  Name: [name] Brown MRN: [id number] Date of Birth: Oct 06, 1955 Referring Provider: Miguel Aschoff   Encounter Date: 03/11/2018  PT End of Session - 03/11/18 1201    Visit Number  8    Number of Visits  17    PT Start Time  1117    PT Stop Time  1205    PT Time Calculation (min)  48 min    Activity Tolerance  Patient tolerated treatment well    Behavior During Therapy  [facility] for tasks assessed/performed       Past Medical History:  Diagnosis Date  . Anxiety   . Cervical spondylosis without myelopathy   . Cervicalgia 01/11/2014  . Degeneration of cervical intervertebral disc   . Endometriosis   . Headache(784.0)   . High cholesterol   . History of fusion of cervical spine 01/11/2014  . IC (interstitial cystitis)   . Myalgia and myositis, unspecified   . Osteoarthrosis, unspecified whether generalized or localized, unspecified site   . Other and unspecified hyperlipidemia   . Pain in joint, shoulder region 01/11/2014  . Pain in joint, upper arm   . Squamous cell carcinoma    [facility]- place on chest  . Unspecified disorder of bladder     Past Surgical History:  Procedure Laterality Date  . BLADDER SUSPENSION  07/2010   With cystocele repair, Dr. Amalia Hailey  . ELBOW SURGERY Right 08/2011  . HYSTERECTOMY ABDOMINAL WITH SALPINGO-OOPHORECTOMY  01/1996  . SPINAL FUSION  10/2010   C5-C6, C6-C7, Dr. Ellene Route  . TONSILLECTOMY  1969    There were no vitals filed for this visit.  Subjective Assessment - 03/11/18 1122    Subjective  Patient reports her back is feeling a lot better, but she is still having a deep tightness in the L hip that is radiating into L paraspinals. Patient reports she did a lot of yardwork yesterday and was very mindful of her posture and proper  squat mechanics. Patient reports she has been attempting to pay more attention to her posture as well to prevent ant pelvic tilt    Pertinent History  Pt is a 62 year old female with history of chronic LBP (multiple years) that she reports has been exacerbated following a fall Feb 2019 where she was standing on a chair to swat at a bug and the chair gave away under her. Pt reports she is currently having pain across bilat LB with pain "deep in L buttock" that she reports is dull and achy. Patient reports worst pain in the past week 7/10; best: 5/10. Patient reports she is having difficulty squatting to play with her 45 year old grandson and standing from seated positon. Patient her pain wakes her up in the middle of the night if she lays on the L side. Pt denies N/V, unexplained weight fluctuation, saddle paresthesia, fever, night sweats, B&B changes or unrelenting night pain at this time.          Ther-Ex -Posterior pelvic tilts 12x with 3sec holds with cuing initially for proper form with patient demonstrating 75% carry over from last session  -TA marching 3x 10 with cuing initially to maintain posterior pelvic tilt without pelvic rotation for functional spine/pelvis stability  -Dead bug LE only 3x 4each/5each with TC initially for proper form -Education on importance  of core stability with LE movement to mimic ADLs    Manual -STM withtrigger point releaseto bilat lumbar paraspinals (prolonged time spentbilat as patient has symmetricalincreased pain and trigger points here). Following manual techniques: Dry needling(4) 53mm .30needles placed along bilat trigger points in lumbar paraspinals, utilizing shelving technique, d/tincreased muscular spasms and trigger points with the patient positioned in prone. Patient was educated on risks and benefits of therapy and verbally consents to PT.  -STM withtrigger point releaseto L glute max/med/pirifromis . Following manual techniques: Dry  needling(2) 32mm .30needles placed along L glute med/max/piriformis, d/tincreased muscular spasms and trigger points with the patient positioned in prone.    ESTIM+ heat packHiVolt ESTIM10 min at patient toleratedL lumbar paraspinals160Vto 170V on LandL glute195Vincreased to205V through treatment.With PT assessing patient tolerance throughout (increasing intensity as needed), monitoring skin integrity (normal), with decreased pain noted from patient.                     PT Education - 03/11/18 1200    Education provided  Yes    Education Details  DN education, exercise form    Person(s) Educated  Patient    Methods  Explanation;Demonstration;Tactile cues;Verbal cues    Comprehension  Verbal cues required;Returned demonstration;Verbalized understanding;Tactile cues required       PT Short Term Goals - 02/10/18 1402      PT SHORT TERM GOAL #1   Title  Pt will be independent with HEP in order to improve strength, flexibility, and balance in order to decrease fall risk and improve function at home and work.    Time  2    Period  Weeks    Status  New        PT Long Term Goals - 02/10/18 1403      PT LONG TERM GOAL #1   Title  Patient will increase FOTO score to 60 to demonstrate predicted increase in functional mobility to complete ADLs    Baseline  02/10/18 54    Time  8    Period  Weeks    Status  New      PT LONG TERM GOAL #2   Title  Pt will decrease mODI scoreby at least 13 points in order demonstrate clinically significant reduction in pain/disability    Baseline  02/10/18 26%    Time  8    Period  Weeks    Status  New      PT LONG TERM GOAL #3   Title  Pt will decrease 5TSTS by at least 3 seconds in order to demonstrate clinically significant improvement in LE strength    Baseline  02/10/18 18sec    Time  8    Period  Weeks    Status  New      PT LONG TERM GOAL #4   Title  Pt will decrease worst pain as reported on NPRS by at least 3  points in order to demonstrate clinically significant reduction in pain.     Baseline  02/10/18: worst 8/10    Time  8    Period  Weeks    Status  New      PT LONG TERM GOAL #5   Title  Pt will increase hip ext strength by at least 1/2 MMT grade in order to demonstrate improvement in strength and function    Baseline  02/10/18 L 3/5 R 3+/5    Time  8    Period  Weeks    Status  New            Plan - 03/11/18 1203    Clinical Impression Statement  Patient is continuing to respond well to modality + manual (with DN adjunct) treatment, subjectively reporting less pain with strenuous activity following. Patient is demonstrating good carry over of core activation, allowing PT to successfully led therex progression with minimal cuing needed. PT continued to encourage patient to utilize core activation techniques in ADLs as well.     Rehab Potential  Good    Clinical Impairments Affecting Rehab Potential  (+) strong social support, motivation (-) chronicity of pain/fibromyalgia sx, psycho-social factors, hx of spine surgery, sedentary lifestyle, age    PT Frequency  2x / week    PT Duration  8 weeks    PT Treatment/Interventions  ADLs/Self Care Home Management;Electrical Stimulation;Aquatic Therapy;Iontophoresis 4mg /ml Dexamethasone;Moist Heat;Traction;Cryotherapy;Ultrasound;Functional mobility training;Manual techniques;Dry needling;Passive range of motion;Neuromuscular re-education;Balance training;Gait training;Therapeutic exercise;Therapeutic activities;Patient/family education;Energy conservation;Taping    PT Next Visit Plan  Continue lengthening/STM/pain modulation for spinal/hip musculature (focus on L), adding in core strengthening as able.     PT Home Exercise Plan  Self lumbar traction, bridge, modified R lateral child's pose, seated piriformis stretch    Consulted and Agree with Plan of Care  Patient       Patient will benefit from skilled therapeutic intervention in order to  improve the following deficits and impairments:  Increased fascial restricitons, Pain, Improper body mechanics, Decreased mobility, Increased muscle spasms, Impaired tone, Postural dysfunction, Decreased endurance, Decreased activity tolerance, Decreased range of motion, Decreased strength, Decreased balance, Difficulty walking, Impaired flexibility  Visit Diagnosis: Chronic right-sided low back pain without sciatica     Problem List Patient Active Problem List   Diagnosis Date Noted  . Depression, major, in remission ([place]) 01/06/2018  . Hyperlipidemia 06/07/2015  . IBS (irritable bowel syndrome) 03/01/2015  . Depression 03/01/2015  . Fibromyalgia 03/01/2015  . Hypothyroid 03/01/2015  . Pain in joint, shoulder region 01/11/2014  . Cervicalgia 01/11/2014  . History of fusion of cervical spine 01/11/2014  . Headache(784.0) 03/29/2013  . Chronic interstitial cystitis 11/05/2012   Shelton Silvas PT, DPT Shelton Silvas 03/11/2018, 12:05 PM  [facility] [address] [place], [address] Phone: [phone]   Fax:  [phone]  Name: [name] Brown MRN: [id number] Date of Birth: 1956-02-22

## 2018-03-14 DIAGNOSIS — J0101 Acute recurrent maxillary sinusitis: Secondary | ICD-10-CM | POA: Diagnosis not present

## 2018-03-17 ENCOUNTER — Encounter: Payer: Self-pay | Admitting: Physical Therapy

## 2018-03-17 ENCOUNTER — Ambulatory Visit: Payer: PPO | Admitting: Physical Therapy

## 2018-03-17 DIAGNOSIS — M545 Low back pain, unspecified: Secondary | ICD-10-CM

## 2018-03-17 DIAGNOSIS — G8929 Other chronic pain: Secondary | ICD-10-CM

## 2018-03-17 NOTE — Therapy (Signed)
[facility] [address] [place], [address] Phone: [phone]   Fax:  [phone]  Physical Therapy Treatment  Patient Details  Name: [name] Brown MRN: [id number] Date of Birth: Aug 15, 1956 Referring Provider: Miguel Aschoff   Encounter Date: 03/17/2018  PT End of Session - 03/17/18 1041    Visit Number  9    Number of Visits  17    PT Start Time  6222    PT Stop Time  1115    PT Time Calculation (min)  45 min    Activity Tolerance  Patient tolerated treatment well    Behavior During Therapy  [facility] for tasks assessed/performed       Past Medical History:  Diagnosis Date  . Anxiety   . Cervical spondylosis without myelopathy   . Cervicalgia 01/11/2014  . Degeneration of cervical intervertebral disc   . Endometriosis   . Headache(784.0)   . High cholesterol   . History of fusion of cervical spine 01/11/2014  . IC (interstitial cystitis)   . Myalgia and myositis, unspecified   . Osteoarthrosis, unspecified whether generalized or localized, unspecified site   . Other and unspecified hyperlipidemia   . Pain in joint, shoulder region 01/11/2014  . Pain in joint, upper arm   . Squamous cell carcinoma    [facility]- place on chest  . Unspecified disorder of bladder     Past Surgical History:  Procedure Laterality Date  . BLADDER SUSPENSION  07/2010   With cystocele repair, Dr. Amalia Hailey  . ELBOW SURGERY Right 08/2011  . HYSTERECTOMY ABDOMINAL WITH SALPINGO-OOPHORECTOMY  01/1996  . SPINAL FUSION  10/2010   C5-C6, C6-C7, Dr. Ellene Route  . TONSILLECTOMY  1969    There were no vitals filed for this visit.  Subjective Assessment - 03/17/18 1034    Subjective  Patient reports she feels very "tense in bilat shoulders" but that she thinks her LBP is "getting better". Patient reports the "DN is helping the glute and LB, and she does not feel the tight band in the rear as much". Patient reports she has  been having some "mild LBP" but that "after being in so much pain for so long it is a breath of fresh air to not be in severe pain". Patient reports 4/10 pain in LB, and 6/10 pain in bilat UT    Pertinent History  Pt is a 62 year old female with history of chronic LBP (multiple years) that she reports has been exacerbated following a fall Feb 2019 where she was standing on a chair to swat at a bug and the chair gave away under her. Pt reports she is currently having pain across bilat LB with pain "deep in L buttock" that she reports is dull and achy. Patient reports worst pain in the past week 7/10; best: 5/10. Patient reports she is having difficulty squatting to play with her 23 year old grandson and standing from seated positon. Patient her pain wakes her up in the middle of the night if she lays on the L side. Pt denies N/V, unexplained weight fluctuation, saddle paresthesia, fever, night sweats, B&B changes or unrelenting night pain at this time.    Limitations  Lifting;House hold activities;Walking    How long can you sit comfortably?  unlimited    How long can you stand comfortably?  unlimited    How long can you walk comfortably?  62min    Diagnostic tests  MRI scheduled "  if PT does not clear up symptoms"    Pain Onset  More than a month ago        Manual -STM withtrigger point releaseto bilat UT (prolonged time spenthere as pt has lincreased pain and trigger points here). Following manual techniques: Dry needling(4) 54mm .30needles placed along bilat trigger points in bilat UT, utilizing pincer grasp, d/tincreased muscular spasms and trigger points with the patient positioned insupine. Patient was educated on risks and benefits of therapy and verbally consents to PT.  -During ESTIM STM withtrigger point releaseto L glute max/med/pirifromisand bilat lumbar paraspinals L>R    ESTIM+ heat packHiVolt ESTIM59min at patient tolerated235Vto 240V at bilat UT.With PT assessing  patient tolerance throughout (increasing intensity as needed), monitoring skin integrity (normal), with decreased pain noted from patient. During ESTIM treatment    Therapeutic Activities: Duirng ESTIM discussed breath control and valsalva (due to surprise or stress) and the effect of "holding her breath" on UT tension. PT also continued to encourage proper posture with depressed bilat shoulder positioning to decrease tension here (in the same respect that patient utilizes post pelvic tilt to prevent tension in paraspinals).PT demonstrated this and utilized visual aids during ESTIM and following ESTIM had patient demonstrate proper posture to ensure carry over                     PT Education - 03/17/18 1040    Education Details  DN education, exercise form    Person(s) Educated  Patient    Methods  Explanation;Verbal cues;Tactile cues    Comprehension  Verbalized understanding;Verbal cues required       PT Short Term Goals - 02/10/18 1402      PT SHORT TERM GOAL #1   Title  Pt will be independent with HEP in order to improve strength, flexibility, and balance in order to decrease fall risk and improve function at home and work.    Time  2    Period  Weeks    Status  New        PT Long Term Goals - 02/10/18 1403      PT LONG TERM GOAL #1   Title  Patient will increase FOTO score to 60 to demonstrate predicted increase in functional mobility to complete ADLs    Baseline  02/10/18 54    Time  8    Period  Weeks    Status  New      PT LONG TERM GOAL #2   Title  Pt will decrease mODI scoreby at least 13 points in order demonstrate clinically significant reduction in pain/disability    Baseline  02/10/18 26%    Time  8    Period  Weeks    Status  New      PT LONG TERM GOAL #3   Title  Pt will decrease 5TSTS by at least 3 seconds in order to demonstrate clinically significant improvement in LE strength    Baseline  02/10/18 18sec    Time  8    Period  Weeks     Status  New      PT LONG TERM GOAL #4   Title  Pt will decrease worst pain as reported on NPRS by at least 3 points in order to demonstrate clinically significant reduction in pain.     Baseline  02/10/18: worst 8/10    Time  8    Period  Weeks    Status  New  PT LONG TERM GOAL #5   Title  Pt will increase hip ext strength by at least 1/2 MMT grade in order to demonstrate improvement in strength and function    Baseline  02/10/18 L 3/5 R 3+/5    Time  8    Period  Weeks    Status  New            Plan - 03/17/18 1214    Clinical Impression Statement  Patient is continuing to respond well to manual/modality/DN therapy to decrease tension to restore proper posture, with good posture carry over between sessions. PT was unable to progress core therex, d/t increased time needed to muscular tension in multiple sites, but encouraged patient to continue this at home, with progression from PT in future session as able.     Rehab Potential  Good    Clinical Impairments Affecting Rehab Potential  (+) strong social support, motivation (-) chronicity of pain/fibromyalgia sx, psycho-social factors, hx of spine surgery, sedentary lifestyle, age    PT Frequency  2x / week    PT Duration  8 weeks    PT Treatment/Interventions  ADLs/Self Care Home Management;Electrical Stimulation;Aquatic Therapy;Iontophoresis 4mg /ml Dexamethasone;Moist Heat;Traction;Cryotherapy;Ultrasound;Functional mobility training;Manual techniques;Dry needling;Passive range of motion;Neuromuscular re-education;Balance training;Gait training;Therapeutic exercise;Therapeutic activities;Patient/family education;Energy conservation;Taping    PT Next Visit Plan  Continue lengthening/STM/pain modulation for spinal/hip musculature (focus on L), adding in core strengthening as able.     PT Home Exercise Plan  Self lumbar traction, bridge, modified R lateral child's pose, seated piriformis stretch    Consulted and Agree with Plan of Care   Patient       Patient will benefit from skilled therapeutic intervention in order to improve the following deficits and impairments:  Increased fascial restricitons, Pain, Improper body mechanics, Decreased mobility, Increased muscle spasms, Impaired tone, Postural dysfunction, Decreased endurance, Decreased activity tolerance, Decreased range of motion, Decreased strength, Decreased balance, Difficulty walking, Impaired flexibility  Visit Diagnosis: Chronic right-sided low back pain without sciatica     Problem List Patient Active Problem List   Diagnosis Date Noted  . Depression, major, in remission ([place]) 01/06/2018  . Hyperlipidemia 06/07/2015  . IBS (irritable bowel syndrome) 03/01/2015  . Depression 03/01/2015  . Fibromyalgia 03/01/2015  . Hypothyroid 03/01/2015  . Pain in joint, shoulder region 01/11/2014  . Cervicalgia 01/11/2014  . History of fusion of cervical spine 01/11/2014  . Headache(784.0) 03/29/2013  . Chronic interstitial cystitis 11/05/2012   [name] Brown PT, DPT [name] Brown 03/17/2018, 12:15 PM  [facility] [address], [place], [address] Phone: [phone]   Fax:  [phone]  Name: [name] Brown MRN: [id number] Date of Birth: 23-Sep-1956

## 2018-03-19 ENCOUNTER — Ambulatory Visit: Payer: PPO | Admitting: Physical Therapy

## 2018-03-19 ENCOUNTER — Encounter: Payer: Self-pay | Admitting: Physical Therapy

## 2018-03-19 DIAGNOSIS — M545 Low back pain: Secondary | ICD-10-CM | POA: Diagnosis not present

## 2018-03-19 DIAGNOSIS — G8929 Other chronic pain: Secondary | ICD-10-CM

## 2018-03-19 NOTE — Therapy (Signed)
[facility] [address], [place], [address] Phone: [phone]   Fax:  [phone]  Physical Therapy Treatment  Patient Details  Name: [name] Brown MRN: [id number] Date of Birth: 05/02/1956 Referring Provider: Miguel Aschoff   Encounter Date: 03/19/2018  PT End of Session - 03/19/18 1043    Visit Number  10    Number of Visits  17    Date for PT Re-Evaluation  04/07/18    PT Start Time  1030    PT Stop Time  1115    PT Time Calculation (min)  45 min    Activity Tolerance  Patient tolerated treatment well    Behavior During Therapy  [facility] for tasks assessed/performed       Past Medical History:  Diagnosis Date  . Anxiety   . Cervical spondylosis without myelopathy   . Cervicalgia 01/11/2014  . Degeneration of cervical intervertebral disc   . Endometriosis   . Headache(784.0)   . High cholesterol   . History of fusion of cervical spine 01/11/2014  . IC (interstitial cystitis)   . Myalgia and myositis, unspecified   . Osteoarthrosis, unspecified whether generalized or localized, unspecified site   . Other and unspecified hyperlipidemia   . Pain in joint, shoulder region 01/11/2014  . Pain in joint, upper arm   . Squamous cell carcinoma    [facility]- place on chest  . Unspecified disorder of bladder     Past Surgical History:  Procedure Laterality Date  . BLADDER SUSPENSION  07/2010   With cystocele repair, Dr. Amalia Hailey  . ELBOW SURGERY Right 08/2011  . HYSTERECTOMY ABDOMINAL WITH SALPINGO-OOPHORECTOMY  01/1996  . SPINAL FUSION  10/2010   C5-C6, C6-C7, Dr. Ellene Route  . TONSILLECTOMY  1969    There were no vitals filed for this visit.  Subjective Assessment - 03/19/18 1036    Subjective  Patient reports increased pain in the LB and RIGHT hip today after doing yard work yesterday. Patient reports 5/10 pain in R hip and LB, no pain in L hip or bilat shoulders. Patient reports her bilat  shoulders "feel good" which she is happy about. Patient reports she does not think she did any activity that would affect the R hip > L hip, and that she did try to use proper squat form. Patient reports she thinks DN + stretching HEP is helping her pain. Patient reports compliance with her HEP.     Pertinent History  Pt is a 62 year old female with history of chronic LBP (multiple years) that she reports has been exacerbated following a fall Feb 2019 where she was standing on a chair to swat at a bug and the chair gave away under her. Pt reports she is currently having pain across bilat LB with pain "deep in L buttock" that she reports is dull and achy. Patient reports worst pain in the past week 7/10; best: 5/10. Patient reports she is having difficulty squatting to play with her 44 year old grandson and standing from seated positon. Patient her pain wakes her up in the middle of the night if she lays on the L side. Pt denies N/V, unexplained weight fluctuation, saddle paresthesia, fever, night sweats, B&B changes or unrelenting night pain at this time.    Limitations  Lifting;House hold activities;Walking    How long can you sit comfortably?  unlimited    How long can you stand comfortably?  unlimited  How long can you walk comfortably?  40min    Diagnostic tests  MRI scheduled "if PT does not clear up symptoms"    Pain Onset  More than a month ago             Manual -STM withtrigger point releaseto bilat upper lumbar paraspinals/lower thoracic paraspinals (R>L). Following manual techniques: Dry needling(4) 67mm .30needles placed along  trigger points in L thoracic/lumbar paraspinals, utilizing shelving technique, d/tincreased muscular spasms and trigger points with the patient positioned inprone. Patient was educated on risks and benefits of therapy and verbally consents to PT.  -STM withtrigger point releaseto R glute max/med/pirifromis.   Ther-Ex -TA pushdowns 3x 10 with 10#  with demo and max TC/VC needed initially for proper form -Palloff press 3x 10 with red tband with demo and  max TC/VC needed initially for proper form, with tactile target throughout to prevent rotation  -Continued postural education, cuing to prevent shoulder hiking to decrease UT tightness    ESTIM+ heat packHiVolt ESTIM73min at patient tolerated160Vto 1800V at bilat upper lumbar/lower thoracic paraspinals.With PT assessing patient tolerance throughout (increasing intensity as needed), monitoring skin integrity (normal), with decreased pain noted from patient.During ESTIM treatment                    PT Education - 03/19/18 1041    Education provided  Yes    Education Details  Exercise form    Person(s) Educated  Patient    Methods  Explanation;Demonstration;Verbal cues    Comprehension  Verbal cues required;Returned demonstration;Verbalized understanding       PT Short Term Goals - 02/10/18 1402      PT SHORT TERM GOAL #1   Title  Pt will be independent with HEP in order to improve strength, flexibility, and balance in order to decrease fall risk and improve function at home and work.    Time  2    Period  Weeks    Status  New        PT Long Term Goals - 02/10/18 1403      PT LONG TERM GOAL #1   Title  Patient will increase FOTO score to 60 to demonstrate predicted increase in functional mobility to complete ADLs    Baseline  02/10/18 54    Time  8    Period  Weeks    Status  New      PT LONG TERM GOAL #2   Title  Pt will decrease mODI scoreby at least 13 points in order demonstrate clinically significant reduction in pain/disability    Baseline  02/10/18 26%    Time  8    Period  Weeks    Status  New      PT LONG TERM GOAL #3   Title  Pt will decrease 5TSTS by at least 3 seconds in order to demonstrate clinically significant improvement in LE strength    Baseline  02/10/18 18sec    Time  8    Period  Weeks    Status  New      PT LONG TERM  GOAL #4   Title  Pt will decrease worst pain as reported on NPRS by at least 3 points in order to demonstrate clinically significant reduction in pain.     Baseline  02/10/18: worst 8/10    Time  8    Period  Weeks    Status  New      PT LONG TERM GOAL #5  Title  Pt will increase hip ext strength by at least 1/2 MMT grade in order to demonstrate improvement in strength and function    Baseline  02/10/18 L 3/5 R 3+/5    Time  8    Period  Weeks    Status  New            Plan - 03/19/18 1236    Clinical Impression Statement  Patient is reporting increased pain in L hip, most likely d/t compensation. Patient is continuing to respond well to manual techniques to decrease pain and tension. Following, PT was able to led patient through increased therex progression, which she was able to complete with accuracy following max cuing from PT. Patient is demonstrating good carry over of postural education between session, with min cuing to correct posture with all exercises.     Rehab Potential  Good    Clinical Impairments Affecting Rehab Potential  (+) strong social support, motivation (-) chronicity of pain/fibromyalgia sx, psycho-social factors, hx of spine surgery, sedentary lifestyle, age    PT Frequency  2x / week    PT Duration  8 weeks    PT Next Visit Plan  Continue lengthening/STM/pain modulation for spinal/hip musculature (focus on L), hip and core strengthening     PT Home Exercise Plan  Self lumbar traction, bridge, modified R lateral child's pose, seated piriformis stretch    Consulted and Agree with Plan of Care  Patient       Patient will benefit from skilled therapeutic intervention in order to improve the following deficits and impairments:  Increased fascial restricitons, Pain, Improper body mechanics, Decreased mobility, Increased muscle spasms, Impaired tone, Postural dysfunction, Decreased endurance, Decreased activity tolerance, Decreased range of motion, Decreased  strength, Decreased balance, Difficulty walking, Impaired flexibility  Visit Diagnosis: Chronic right-sided low back pain without sciatica     Problem List Patient Active Problem List   Diagnosis Date Noted  . Depression, major, in remission ([place]) 01/06/2018  . Hyperlipidemia 06/07/2015  . IBS (irritable bowel syndrome) 03/01/2015  . Depression 03/01/2015  . Fibromyalgia 03/01/2015  . Hypothyroid 03/01/2015  . Pain in joint, shoulder region 01/11/2014  . Cervicalgia 01/11/2014  . History of fusion of cervical spine 01/11/2014  . Headache(784.0) 03/29/2013  . Chronic interstitial cystitis 11/05/2012   Shelton Silvas PT, DPT Shelton Silvas 03/19/2018, 12:59 PM  [facility] [address] [place], [address] Phone: [phone]   Fax:  [phone]  Name: [name] Brown MRN: [id number] Date of Birth: 17-Mar-1956

## 2018-03-24 ENCOUNTER — Encounter: Payer: PPO | Admitting: Physical Therapy

## 2018-03-26 ENCOUNTER — Encounter: Payer: PPO | Admitting: Physical Therapy

## 2018-03-27 DIAGNOSIS — R05 Cough: Secondary | ICD-10-CM | POA: Diagnosis not present

## 2018-03-27 DIAGNOSIS — J324 Chronic pansinusitis: Secondary | ICD-10-CM | POA: Diagnosis not present

## 2018-03-28 ENCOUNTER — Other Ambulatory Visit
Admission: RE | Admit: 2018-03-28 | Discharge: 2018-03-28 | Disposition: A | Discharge status: Home / Self Care | Payer: PPO | Source: Ambulatory Visit | Attending: Family Medicine | Admitting: Family Medicine

## 2018-03-28 ENCOUNTER — Other Ambulatory Visit: Payer: Self-pay | Admitting: Family Medicine

## 2018-03-28 DIAGNOSIS — R05 Cough: Secondary | ICD-10-CM | POA: Insufficient documentation

## 2018-03-28 LAB — EXPECTORATED SPUTUM ASSESSMENT W REFEX TO RESP CULTURE

## 2018-03-28 LAB — EXPECTORATED SPUTUM ASSESSMENT W GRAM STAIN, RFLX TO RESP C

## 2018-03-31 ENCOUNTER — Encounter: Payer: Self-pay | Admitting: Physical Therapy

## 2018-03-31 ENCOUNTER — Ambulatory Visit: Payer: PPO | Attending: Family Medicine | Admitting: Physical Therapy

## 2018-03-31 DIAGNOSIS — M545 Low back pain, unspecified: Secondary | ICD-10-CM

## 2018-03-31 DIAGNOSIS — G8929 Other chronic pain: Secondary | ICD-10-CM | POA: Diagnosis not present

## 2018-03-31 LAB — CULTURE, RESPIRATORY W GRAM STAIN: Culture: NORMAL

## 2018-03-31 LAB — CULTURE, RESPIRATORY

## 2018-03-31 NOTE — Therapy (Signed)
[facility] [address], [place], [address] Phone: [phone]   Fax:  [phone]  Physical Therapy Treatment  Patient Details  Name: [name] Brown MRN: [id number] Date of Birth: 1956/07/21 Referring Provider: Miguel Aschoff   Encounter Date: 03/31/2018  PT End of Session - 03/31/18 1041    Visit Number  11    Number of Visits  17    Date for PT Re-Evaluation  04/07/18    PT Start Time  1030    PT Stop Time  1115    PT Time Calculation (min)  45 min    Activity Tolerance  Patient tolerated treatment well    Behavior During Therapy  [facility] for tasks assessed/performed       Past Medical History:  Diagnosis Date  . Anxiety   . Cervical spondylosis without myelopathy   . Cervicalgia 01/11/2014  . Degeneration of cervical intervertebral disc   . Endometriosis   . Headache(784.0)   . High cholesterol   . History of fusion of cervical spine 01/11/2014  . IC (interstitial cystitis)   . Myalgia and myositis, unspecified   . Osteoarthrosis, unspecified whether generalized or localized, unspecified site   . Other and unspecified hyperlipidemia   . Pain in joint, shoulder region 01/11/2014  . Pain in joint, upper arm   . Squamous cell carcinoma    [facility]- place on chest  . Unspecified disorder of bladder     Past Surgical History:  Procedure Laterality Date  . BLADDER SUSPENSION  07/2010   With cystocele repair, Dr. Amalia Hailey  . ELBOW SURGERY Right 08/2011  . HYSTERECTOMY ABDOMINAL WITH SALPINGO-OOPHORECTOMY  01/1996  . SPINAL FUSION  10/2010   C5-C6, C6-C7, Dr. Ellene Route  . TONSILLECTOMY  1969    There were no vitals filed for this visit.  Subjective Assessment - 03/31/18 1033    Subjective  Patient reports she is still having some pain in the LB and bilat glutes, but reports it is better than it "used to be", which she is pleased with. Patient reports her pain is mostly localized to the LB  at this time and reports over the past week it has been a 5/10. Pt reports comopliance with her HEP with no questions or concerns. Patient reports she is continuing to find pain relief from DN from session to session.     Pertinent History  Pt is a 62 year old female with history of chronic LBP (multiple years) that she reports has been exacerbated following a fall Feb 2019 where she was standing on a chair to swat at a bug and the chair gave away under her. Pt reports she is currently having pain across bilat LB with pain "deep in L buttock" that she reports is dull and achy. Patient reports worst pain in the past week 7/10; best: 5/10. Patient reports she is having difficulty squatting to play with her 50 year old grandson and standing from seated positon. Patient her pain wakes her up in the middle of the night if she lays on the L side. Pt denies N/V, unexplained weight fluctuation, saddle paresthesia, fever, night sweats, B&B changes or unrelenting night pain at this time.    Limitations  Lifting;House hold activities;Walking    How long can you sit comfortably?  unlimited    How long can you stand comfortably?  unlimited    How long can you walk comfortably?  104min    Diagnostic tests  MRI scheduled "if PT does not clear up symptoms"    Pain Onset  More than a month ago             Manual -STM withtrigger point releaseto bilat upper lumbar paraspinals/lower thoracic paraspinals (R>L). Following manual techniques: Dry needling(4) 35mm .30needles placed along  trigger points in L thoracic/lumbar paraspinals, utilizing shelving technique, and lumbar multifidi, d/tincreased muscular spasms and trigger points with the patient positioned inprone. Patient was educated on risks and benefits of therapy and verbally consents to PT.  -STM withtrigger point releasetoR glute max/med/pirifromis.   Ther-Ex -Mini squat from elevated mat table with demo and max cuing needed initially for proper  form preventing "knees over toes" and "weight on heels"; eventually able to do 3x 10  -Education on continuing practicing this at home as throughout reps she questions "if she is she doing it correctly" -PT educated patient on utilizing this form for squatting in her garden and with housework. PT also educated patient to use kneeling position on a mat if she knows she is going to be staying in the same position in her garden for a long time   ESTIM+ heat packHiVolt ESTIM86min at patient tolerated175Vto1800Vat bilat upper lumbar/lower thoracic paraspinals.With PT assessing patient tolerance throughout (increasing intensity as needed), monitoring skin integrity (normal), with decreased pain noted from patient.During ESTIM treatmentPT continued functional squat education with anatomy education on the shortening of paraspinals with spine ext from abberrant lifting                    PT Education - 03/31/18 1041    Education provided  Yes    Education Details  Exercise form    Person(s) Educated  Patient    Methods  Explanation;Demonstration;Verbal cues    Comprehension  Verbalized understanding;Returned demonstration;Verbal cues required       PT Short Term Goals - 02/10/18 1402      PT SHORT TERM GOAL #1   Title  Pt will be independent with HEP in order to improve strength, flexibility, and balance in order to decrease fall risk and improve function at home and work.    Time  2    Period  Weeks    Status  New        PT Long Term Goals - 02/10/18 1403      PT LONG TERM GOAL #1   Title  Patient will increase FOTO score to 60 to demonstrate predicted increase in functional mobility to complete ADLs    Baseline  02/10/18 54    Time  8    Period  Weeks    Status  New      PT LONG TERM GOAL #2   Title  Pt will decrease mODI scoreby at least 13 points in order demonstrate clinically significant reduction in pain/disability    Baseline  02/10/18 26%    Time  8     Period  Weeks    Status  New      PT LONG TERM GOAL #3   Title  Pt will decrease 5TSTS by at least 3 seconds in order to demonstrate clinically significant improvement in LE strength    Baseline  02/10/18 18sec    Time  8    Period  Weeks    Status  New      PT LONG TERM GOAL #4   Title  Pt will decrease worst pain as reported on NPRS by at least 3 points in  order to demonstrate clinically significant reduction in pain.     Baseline  02/10/18: worst 8/10    Time  8    Period  Weeks    Status  New      PT LONG TERM GOAL #5   Title  Pt will increase hip ext strength by at least 1/2 MMT grade in order to demonstrate improvement in strength and function    Baseline  02/10/18 L 3/5 R 3+/5    Time  8    Period  Weeks    Status  New            Plan - 03/31/18 1327    Clinical Impression Statement  Patient is continuing to report decreased pain between sessions. PT continued to build on proper posture/motor patterns, looking at functional squatting. PT encouraged patient to build on the core bracing and neutral pelvic alignment she has been working on, adding in LE with squat. Following prolonged cuing patient is able to achieve proper functional squat, with advisement to add this to HEP to improve motor control to decrease pain from abberant movement. Patient verbalized understanding of all education provided    Rehab Potential  Good    Clinical Impairments Affecting Rehab Potential  (+) strong social support, motivation (-) chronicity of pain/fibromyalgia sx, psycho-social factors, hx of spine surgery, sedentary lifestyle, age    PT Frequency  2x / week    PT Duration  8 weeks    PT Treatment/Interventions  ADLs/Self Care Home Management;Electrical Stimulation;Aquatic Therapy;Iontophoresis 4mg /ml Dexamethasone;Moist Heat;Traction;Cryotherapy;Ultrasound;Functional mobility training;Manual techniques;Dry needling;Passive range of motion;Neuromuscular re-education;Balance training;Gait  training;Therapeutic exercise;Therapeutic activities;Patient/family education;Energy conservation;Taping    PT Next Visit Plan  Continue lengthening/STM/pain modulation for spinal/hip musculature (focus on L), hip and core strengthening     PT Home Exercise Plan  Self lumbar traction, bridge, modified R lateral child's pose, seated piriformis stretch    Consulted and Agree with Plan of Care  Patient       Patient will benefit from skilled therapeutic intervention in order to improve the following deficits and impairments:  Increased fascial restricitons, Pain, Improper body mechanics, Decreased mobility, Increased muscle spasms, Impaired tone, Postural dysfunction, Decreased endurance, Decreased activity tolerance, Decreased range of motion, Decreased strength, Decreased balance, Difficulty walking, Impaired flexibility  Visit Diagnosis: Chronic right-sided low back pain without sciatica     Problem List Patient Active Problem List   Diagnosis Date Noted  . Depression, major, in remission ([place]) 01/06/2018  . Hyperlipidemia 06/07/2015  . IBS (irritable bowel syndrome) 03/01/2015  . Depression 03/01/2015  . Fibromyalgia 03/01/2015  . Hypothyroid 03/01/2015  . Pain in joint, shoulder region 01/11/2014  . Cervicalgia 01/11/2014  . History of fusion of cervical spine 01/11/2014  . Headache(784.0) 03/29/2013  . Chronic interstitial cystitis 11/05/2012   Shelton Silvas PT, DPT Shelton Silvas 03/31/2018, 1:39 PM  [facility] [address], [place], [address] Phone: [phone]   Fax:  [phone]  Name: [name] Brown MRN: [id number] Date of Birth: 11-22-1955

## 2018-04-03 ENCOUNTER — Encounter: Payer: PPO | Admitting: Physical Therapy

## 2018-04-07 ENCOUNTER — Encounter: Payer: Self-pay | Admitting: Physical Therapy

## 2018-04-07 ENCOUNTER — Ambulatory Visit: Payer: PPO | Admitting: Physical Therapy

## 2018-04-07 DIAGNOSIS — M545 Low back pain, unspecified: Secondary | ICD-10-CM

## 2018-04-07 DIAGNOSIS — G8929 Other chronic pain: Secondary | ICD-10-CM

## 2018-04-07 NOTE — Therapy (Signed)
[facility] [address] [place], [address] Phone: [phone]   Fax:  [phone]  Physical Therapy Treatment  Patient Details  Name: [name] Brown MRN: [id number] Date of Birth: 12/22/1955 Referring Provider: Miguel Aschoff   Encounter Date: 04/07/2018  PT End of Session - 04/07/18 1331    Visit Number  12    Number of Visits  23    Date for PT Re-Evaluation  05/26/18    PT Start Time  1030    PT Stop Time  1115    PT Time Calculation (min)  45 min    Activity Tolerance  Patient tolerated treatment well    Behavior During Therapy  [facility] for tasks assessed/performed       Past Medical History:  Diagnosis Date  . Anxiety   . Cervical spondylosis without myelopathy   . Cervicalgia 01/11/2014  . Degeneration of cervical intervertebral disc   . Endometriosis   . Headache(784.0)   . High cholesterol   . History of fusion of cervical spine 01/11/2014  . IC (interstitial cystitis)   . Myalgia and myositis, unspecified   . Osteoarthrosis, unspecified whether generalized or localized, unspecified site   . Other and unspecified hyperlipidemia   . Pain in joint, shoulder region 01/11/2014  . Pain in joint, upper arm   . Squamous cell carcinoma    [facility]- place on chest  . Unspecified disorder of bladder     Past Surgical History:  Procedure Laterality Date  . BLADDER SUSPENSION  07/2010   With cystocele repair, Dr. Amalia Hailey  . ELBOW SURGERY Right 08/2011  . HYSTERECTOMY ABDOMINAL WITH SALPINGO-OOPHORECTOMY  01/1996  . SPINAL FUSION  10/2010   C5-C6, C6-C7, Dr. Ellene Route  . TONSILLECTOMY  1969    There were no vitals filed for this visit.  Subjective Assessment - 04/07/18 1036    Subjective  Patient reports she is seeing neurology tomorrow, for LBP. Patient reports she feels much better since starting PT. Patient does report she has some pain that she feels like is at random. Patient reports  that she was more active over the past week playing with her grandchildren, and her pain was not greatly exacerbated by this. Patient reports 4/10 pain in the LBP without radiating down the LE today. Patient reports increased shoulder/cervical pain today in the UT that she reports is just "very tight".     Pertinent History  Pt is a 62 year old female with history of chronic LBP (multiple years) that she reports has been exacerbated following a fall Feb 2019 where she was standing on a chair to swat at a bug and the chair gave away under her. Pt reports she is currently having pain across bilat LB with pain "deep in L buttock" that she reports is dull and achy. Patient reports worst pain in the past week 7/10; best: 5/10. Patient reports she is having difficulty squatting to play with her 66 year old grandson and standing from seated positon. Patient her pain wakes her up in the middle of the night if she lays on the L side. Pt denies N/V, unexplained weight fluctuation, saddle paresthesia, fever, night sweats, B&B changes or unrelenting night pain at this time.    Limitations  Lifting;House hold activities;Walking    How long can you sit comfortably?  unlimited    How long can you stand comfortably?  unlimited    How long can you walk comfortably?  84min    Diagnostic tests  MRI scheduled "if PT does not clear up symptoms"    Pain Onset  More than a month ago       Manual -STM withtrigger point releaseto bilatUT(R>L). Following manual techniques: Dry needling(4) 52mm .30needles placed along trigger points in bilat UT (2 each), utilizing pincer grasp with patient in supine, d/tincreased muscular spasms and trigger points with the patient positioned inprone. Patient was educated on risks and benefits of therapy and verbally consents to PT.    Ther-Ex -Dead bug review x10 with patient demonstrating 75% proper carry over of proper form, with 100% accuracy demonstrated following PT cuing and  demo -TA marches 2x 10 (added to HEP) -Postural review with STS. Patient noticing "increased pain at [facility]  with STS when not utilizing proper form" -2 trials of 5x STS for goal update -Attempted bridge exercise, still painful in LB, so kept STS exercise on HEP as opposed to bridge -HEP review with updated printout with all home exercises, increasing stretching to 45sec  ESTIM+ heat packHiVolt ESTIM72min at patient tolerated180Vto185Vat bilatupper lumbar/lower thoracic paraspinals.With PT assessing patient tolerance throughout (increasing intensity as needed), monitoring skin integrity (normal), with decreased pain noted from patient.During ESTIM treatmentPT obtained survey outcome measures from patient and discussed POC updates with patient with PT and patient agreeing on 1x/week frequency for the next 6 weeks                         PT Education - 04/07/18 1332    Education provided  Yes    Education Details  Exercise form; HEP review; POC updates    Person(s) Educated  Patient    Methods  Explanation;Demonstration;Tactile cues;Verbal cues;Handout    Comprehension  Verbalized understanding;Returned demonstration;Verbal cues required;Tactile cues required       PT Short Term Goals - 04/07/18 1042      PT SHORT TERM GOAL #1   Title  Pt will be independent with HEP in order to improve strength, flexibility, and balance in order to decrease fall risk and improve function at home and work.    Time  2    Period  Weeks    Status  Achieved        PT Long Term Goals - 04/07/18 1043      PT LONG TERM GOAL #1   Title  Patient will increase FOTO score to 60 to demonstrate predicted increase in functional mobility to complete ADLs    Baseline  04/07/18 58    Time  8    Period  Weeks    Status  On-going      PT LONG TERM GOAL #2   Title  Pt will decrease mODI scoreby at least 13 points in order demonstrate clinically significant reduction in pain/disability     Baseline  04/07/18 16%    Time  8    Period  Weeks    Status  On-going      PT LONG TERM GOAL #3   Title  Pt will decrease 5TSTS by at least 3 seconds in order to demonstrate clinically significant improvement in LE strength    Baseline  04/07/18 13sec    Time  8    Status  Achieved      PT LONG TERM GOAL #4   Title  Pt will decrease worst pain as reported on NPRS by at least 3 points in order to demonstrate clinically significant reduction in pain.  Baseline  04/07/18: 6/10    Time  8    Period  Weeks    Status  On-going      PT LONG TERM GOAL #5   Title  Pt will increase hip ext strength by at least 1/2 MMT grade in order to demonstrate improvement in strength and function    Baseline  04/07/18 L 4-/5 R 4+/5    Time  8    Period  Weeks    Status  Achieved      Additional Long Term Goals   Additional Long Term Goals  Yes      PT LONG TERM GOAL #6   Title  Pt will demonstrate symmetrical 4+/5 hip ext strength to safely return to PLOF without pain/compensation d/t assymmetries    Baseline  04/07/18 L 4-/5 R 4+/5    Time  8    Period  Weeks    Status  New            Plan - 04/07/18 1335    Clinical Impression Statement  PT completed re-assessment today, where it is noted that patient is making gains in increasing LE strength, reducing LBP, and increasing activity tolerance. Patient with remaining deficits in symmetrical LE strength, and some remaining pain inhibiting normal activities/PLOF. Patient will continue to beenfit from skilled PT to address these impairments at a frequency of 1x/week, as pt and PT agree that pt is better able to correctly contract proper musculature with proper posture to maintain a more robust HEP.     Rehab Potential  Good    Clinical Impairments Affecting Rehab Potential  (+) strong social support, motivation (-) chronicity of pain/fibromyalgia sx, psycho-social factors, hx of spine surgery, sedentary lifestyle, age    PT Frequency  1x / week     PT Duration  6 weeks    PT Treatment/Interventions  ADLs/Self Care Home Management;Electrical Stimulation;Aquatic Therapy;Iontophoresis 4mg /ml Dexamethasone;Moist Heat;Traction;Cryotherapy;Ultrasound;Functional mobility training;Manual techniques;Dry needling;Passive range of motion;Neuromuscular re-education;Balance training;Gait training;Therapeutic exercise;Therapeutic activities;Patient/family education;Energy conservation;Taping    PT Next Visit Plan  Continue lengthening/STM/pain modulation for spinal/hip musculature (focus on L), hip and core strengthening     PT Home Exercise Plan  Self lumbar traction, STS, TA marching, modified R lateral child's pose, seated piriformis stretch    Consulted and Agree with Plan of Care  Patient       Patient will benefit from skilled therapeutic intervention in order to improve the following deficits and impairments:  Increased fascial restricitons, Pain, Improper body mechanics, Decreased mobility, Increased muscle spasms, Impaired tone, Postural dysfunction, Decreased endurance, Decreased activity tolerance, Decreased range of motion, Decreased strength, Decreased balance, Difficulty walking, Impaired flexibility  Visit Diagnosis: Chronic right-sided low back pain without sciatica     Problem List Patient Active Problem List   Diagnosis Date Noted  . Depression, major, in remission ([place]) 01/06/2018  . Hyperlipidemia 06/07/2015  . IBS (irritable bowel syndrome) 03/01/2015  . Depression 03/01/2015  . Fibromyalgia 03/01/2015  . Hypothyroid 03/01/2015  . Pain in joint, shoulder region 01/11/2014  . Cervicalgia 01/11/2014  . History of fusion of cervical spine 01/11/2014  . Headache(784.0) 03/29/2013  . Chronic interstitial cystitis 11/05/2012   Shelton Silvas PT, DPT Shelton Silvas 04/07/2018, 1:38 PM  [facility] [address] [place], [address] Phone: [phone]    Fax:  [phone]  Name: [name] Brown MRN: [id number] Date of Birth: [birth year]

## 2018-04-08 DIAGNOSIS — M47816 Spondylosis without myelopathy or radiculopathy, lumbar region: Secondary | ICD-10-CM | POA: Diagnosis not present

## 2018-04-09 ENCOUNTER — Encounter: Payer: Self-pay | Admitting: Physical Therapy

## 2018-04-09 ENCOUNTER — Ambulatory Visit: Payer: PPO | Admitting: Physical Therapy

## 2018-04-09 DIAGNOSIS — M545 Low back pain: Principal | ICD-10-CM

## 2018-04-09 DIAGNOSIS — G8929 Other chronic pain: Secondary | ICD-10-CM

## 2018-04-09 NOTE — Therapy (Signed)
[facility] [address] [place], [address] Phone: [phone]   Fax:  [phone]  Physical Therapy Treatment  Patient Details  Name: [name] Brown MRN: [id number] Date of Birth: May 19, 1956 Referring Provider: Miguel Aschoff   Encounter Date: 04/09/2018  PT End of Session - 04/09/18 1106    Visit Number  13    Number of Visits  23    Date for PT Re-Evaluation  05/26/18    PT Start Time  1030    PT Stop Time  1123    PT Time Calculation (min)  53 min    Activity Tolerance  Patient tolerated treatment well    Behavior During Therapy  [facility] for tasks assessed/performed       Past Medical History:  Diagnosis Date  . Anxiety   . Cervical spondylosis without myelopathy   . Cervicalgia 01/11/2014  . Degeneration of cervical intervertebral disc   . Endometriosis   . Headache(784.0)   . High cholesterol   . History of fusion of cervical spine 01/11/2014  . IC (interstitial cystitis)   . Myalgia and myositis, unspecified   . Osteoarthrosis, unspecified whether generalized or localized, unspecified site   . Other and unspecified hyperlipidemia   . Pain in joint, shoulder region 01/11/2014  . Pain in joint, upper arm   . Squamous cell carcinoma    [facility]- place on chest  . Unspecified disorder of bladder     Past Surgical History:  Procedure Laterality Date  . BLADDER SUSPENSION  07/2010   With cystocele repair, Dr. Amalia Hailey  . ELBOW SURGERY Right 08/2011  . HYSTERECTOMY ABDOMINAL WITH SALPINGO-OOPHORECTOMY  01/1996  . SPINAL FUSION  10/2010   C5-C6, C6-C7, Dr. Ellene Route  . TONSILLECTOMY  1969    There were no vitals filed for this visit.  Subjective Assessment - 04/09/18 1039    Subjective  Patient reports when she left session Tuesday she went to her son's house and did a lot of cleaning and reports that she was very sore following this. Patient reports that she saw neurology yesterday,  and she felt better after this appointment as they reported that they "did not see any area of concern". Patient reports she is having 6/10 pain following increased activity Tuesday and sitting in neurology office for 3 hours, and playing with grandchildren in the pool yesterday.     Pertinent History  Pt is a 62 year old female with history of chronic LBP (multiple years) that she reports has been exacerbated following a fall Feb 2019 where she was standing on a chair to swat at a bug and the chair gave away under her. Pt reports she is currently having pain across bilat LB with pain "deep in L buttock" that she reports is dull and achy. Patient reports worst pain in the past week 7/10; best: 5/10. Patient reports she is having difficulty squatting to play with her 94 year old grandson and standing from seated positon. Patient her pain wakes her up in the middle of the night if she lays on the L side. Pt denies N/V, unexplained weight fluctuation, saddle paresthesia, fever, night sweats, B&B changes or unrelenting night pain at this time.    Limitations  Lifting;House hold activities;Walking    How long can you sit comfortably?  unlimited    How long can you stand comfortably?  unlimited    How long can you walk comfortably?  76min  Diagnostic tests  MRI scheduled "if PT does not clear up symptoms"    Pain Onset  More than a month ago          Manual -STM withtrigger point releaseto bilatupperlumbar paraspinals/lower thoracic paraspinals; prolonged time spent here d/t increased tightness from previous sessions. Following manual techniques: Dry needling(4) 60mm .30needles placed along trigger points in L thoracic/lumbar paraspinals, utilizing shelving technique, and lumbar multifidi, d/tincreased muscular spasms and trigger points with the patient positioned inprone. Patient was educated on risks and benefits of therapy and verbally consents to PT.  -STM withtrigger point releasetoRglute  max/med/pirifromis.    Ther-Ex -Theraball crunch 3x 10 with cuing initially for proper form (full posterior pelvic to neutral, without going into ant pelvic tilt) with good carry over between sets -Theraball oblique twists 3x 10 with max cuing and demo needed at first to ensure maintained core contraction throughout -Mini squat 1x 12 with good carry over noted from previous sessions    ESTIM+ heat packHiVolt ESTIM83min at patient tolerated175Vto1800Vat bilatupper lumbar/lower thoracic paraspinals.With PT assessing patient tolerance throughout (increasing intensity as needed), monitoring skin integrity (normal), with decreased pain noted from patient.During ESTIM treatmentPT continued functional squat education with anatomy education on the shortening of paraspinals with spine ext from abberrant lifting                PT Education - 04/09/18 1105    Education provided  Yes    Education Details  Exercise form    Person(s) Educated  Patient    Methods  Explanation;Demonstration;Tactile cues;Verbal cues    Comprehension  Verbalized understanding;Returned demonstration;Verbal cues required;Tactile cues required       PT Short Term Goals - 04/07/18 1042      PT SHORT TERM GOAL #1   Title  Pt will be independent with HEP in order to improve strength, flexibility, and balance in order to decrease fall risk and improve function at home and work.    Time  2    Period  Weeks    Status  Achieved        PT Long Term Goals - 04/07/18 1043      PT LONG TERM GOAL #1   Title  Patient will increase FOTO score to 60 to demonstrate predicted increase in functional mobility to complete ADLs    Baseline  04/07/18 58    Time  8    Period  Weeks    Status  On-going      PT LONG TERM GOAL #2   Title  Pt will decrease mODI scoreby at least 13 points in order demonstrate clinically significant reduction in pain/disability    Baseline  04/07/18 16%    Time  8    Period  Weeks     Status  On-going      PT LONG TERM GOAL #3   Title  Pt will decrease 5TSTS by at least 3 seconds in order to demonstrate clinically significant improvement in LE strength    Baseline  04/07/18 13sec    Time  8    Status  Achieved      PT LONG TERM GOAL #4   Title  Pt will decrease worst pain as reported on NPRS by at least 3 points in order to demonstrate clinically significant reduction in pain.     Baseline  04/07/18: 6/10    Time  8    Period  Weeks    Status  On-going  PT LONG TERM GOAL #5   Title  Pt will increase hip ext strength by at least 1/2 MMT grade in order to demonstrate improvement in strength and function    Baseline  04/07/18 L 4-/5 R 4+/5    Time  8    Period  Weeks    Status  Achieved      Additional Long Term Goals   Additional Long Term Goals  Yes      PT LONG TERM GOAL #6   Title  Pt will demonstrate symmetrical 4+/5 hip ext strength to safely return to PLOF without pain/compensation d/t assymmetries    Baseline  04/07/18 L 4-/5 R 4+/5    Time  8    Period  Weeks    Status  New            Plan - 04/09/18 1116    Clinical Impression Statement  Patient with increased tension today following increased activity, that resolved following prolonged manual techniques and modality treatment. PT is continuing to progress core demand through therex with patient demonstrating good carry over between sessions with proper core activation.    Rehab Potential  Good    Clinical Impairments Affecting Rehab Potential  (+) strong social support, motivation (-) chronicity of pain/fibromyalgia sx, psycho-social factors, hx of spine surgery, sedentary lifestyle, age    PT Frequency  1x / week    PT Duration  6 weeks    PT Treatment/Interventions  ADLs/Self Care Home Management;Electrical Stimulation;Aquatic Therapy;Iontophoresis 4mg /ml Dexamethasone;Moist Heat;Traction;Cryotherapy;Ultrasound;Functional mobility training;Manual techniques;Dry needling;Passive range of  motion;Neuromuscular re-education;Balance training;Gait training;Therapeutic exercise;Therapeutic activities;Patient/family education;Energy conservation;Taping    PT Next Visit Plan  Continue lengthening/STM/pain modulation for spinal/hip musculature (focus on L), hip and core strengthening     PT Home Exercise Plan  Self lumbar traction, STS, TA marching, modified R lateral child's pose, seated piriformis stretch    Consulted and Agree with Plan of Care  Patient       Patient will benefit from skilled therapeutic intervention in order to improve the following deficits and impairments:  Increased fascial restricitons, Pain, Improper body mechanics, Decreased mobility, Increased muscle spasms, Impaired tone, Postural dysfunction, Decreased endurance, Decreased activity tolerance, Decreased range of motion, Decreased strength, Decreased balance, Difficulty walking, Impaired flexibility  Visit Diagnosis: Chronic right-sided low back pain without sciatica     Problem List Patient Active Problem List   Diagnosis Date Noted  . Depression, major, in remission ([place]) 01/06/2018  . Hyperlipidemia 06/07/2015  . IBS (irritable bowel syndrome) 03/01/2015  . Depression 03/01/2015  . Fibromyalgia 03/01/2015  . Hypothyroid 03/01/2015  . Pain in joint, shoulder region 01/11/2014  . Cervicalgia 01/11/2014  . History of fusion of cervical spine 01/11/2014  . Headache(784.0) 03/29/2013  . Chronic interstitial cystitis 11/05/2012   Shelton Silvas PT, DPT Shelton Silvas 04/09/2018, 12:09 PM  [facility] [address] [place], [address] Phone: [phone]   Fax:  [phone]  Name: JAINE ESTABROOKS MRN: [id number] Date of Birth: 05-10-1956

## 2018-04-14 ENCOUNTER — Ambulatory Visit: Payer: PPO | Admitting: Physical Therapy

## 2018-04-14 ENCOUNTER — Encounter: Payer: PPO | Admitting: Physical Therapy

## 2018-04-16 ENCOUNTER — Encounter: Payer: Self-pay | Admitting: Physical Therapy

## 2018-04-16 ENCOUNTER — Ambulatory Visit: Payer: PPO | Admitting: Physical Therapy

## 2018-04-16 ENCOUNTER — Encounter: Payer: PPO | Admitting: Physical Therapy

## 2018-04-16 DIAGNOSIS — M545 Low back pain: Principal | ICD-10-CM

## 2018-04-16 DIAGNOSIS — G8929 Other chronic pain: Secondary | ICD-10-CM

## 2018-04-16 NOTE — Therapy (Signed)
[facility] [address], [place], [address] Phone: [phone]   Fax:  [phone]  Physical Therapy Treatment  Patient Details  Name: [name] Brown MRN: [id number] Date of Birth: [birth year] Referring Provider: Miguel Aschoff   Encounter Date: 04/16/2018  PT End of Session - 04/16/18 1042    Visit Number  14    Number of Visits  23    Date for PT Re-Evaluation  05/26/18    PT Start Time  6962    PT Stop Time  1115    PT Time Calculation (min)  40 min    Activity Tolerance  Patient tolerated treatment well    Behavior During Therapy  [facility] for tasks assessed/performed       Past Medical History:  Diagnosis Date  . Anxiety   . Cervical spondylosis without myelopathy   . Cervicalgia 01/11/2014  . Degeneration of cervical intervertebral disc   . Endometriosis   . Headache(784.0)   . High cholesterol   . History of fusion of cervical spine 01/11/2014  . IC (interstitial cystitis)   . Myalgia and myositis, unspecified   . Osteoarthrosis, unspecified whether generalized or localized, unspecified site   . Other and unspecified hyperlipidemia   . Pain in joint, shoulder region 01/11/2014  . Pain in joint, upper arm   . Squamous cell carcinoma    [facility]- place on chest  . Unspecified disorder of bladder     Past Surgical History:  Procedure Laterality Date  . BLADDER SUSPENSION  07/2010   With cystocele repair, Dr. Amalia Hailey  . ELBOW SURGERY Right 08/2011  . HYSTERECTOMY ABDOMINAL WITH SALPINGO-OOPHORECTOMY  01/1996  . SPINAL FUSION  10/2010   C5-C6, C6-C7, Dr. Ellene Route  . TONSILLECTOMY  1969    There were no vitals filed for this visit.  Subjective Assessment - 04/16/18 1037    Subjective  Patient reports she is doing better overall, but has had a chaotic past couple days and have been unable to complete her stretching and reports this, along with some personal stress, have increased her  pain. Patient reports 5/10 today in bilat low back paraspinals, bilat UT and L buttock. Patient reports she has been unable to complete HEP this week.     Pertinent History  Pt is a 62 year old female with history of chronic LBP (multiple years) that she reports has been exacerbated following a fall Feb 2019 where she was standing on a chair to swat at a bug and the chair gave away under her. Pt reports she is currently having pain across bilat LB with pain "deep in L buttock" that she reports is dull and achy. Patient reports worst pain in the past week 7/10; best: 5/10. Patient reports she is having difficulty squatting to play with her 28 year old grandson and standing from seated positon. Patient her pain wakes her up in the middle of the night if she lays on the L side. Pt denies N/V, unexplained weight fluctuation, saddle paresthesia, fever, night sweats, B&B changes or unrelenting night pain at this time.    Limitations  Lifting;House hold activities;Walking    How long can you sit comfortably?  unlimited    How long can you stand comfortably?  unlimited    How long can you walk comfortably?  65min    Diagnostic tests  MRI scheduled "if PT does not clear up symptoms"    Pain Onset  More than a  month ago          Manual -STM withtrigger point releaseto bilatUT(R>L). Following manual techniques: Dry needling(4) 67mm .30needles placed along trigger points in bilat UT (2 each), utilizing pincer grasp with patient in supine,d/tincreased muscular spasms and trigger points with the patient positioned inprone. Patient was educated on risks and benefits of therapy and verbally consents to PT.  -STM with trigger point release to L glute max/min/piriformis (patient pain complaint) -STM with trigger point release to bilat paraspinals R>L Following manual techniques: Dry needling(4) 76mm .30needles placed along trigger points in bilat paraspinals (pincer grasp) an multifidi at L2/3 (1 finger  breadth away from spinous process, pistoning in inferio-medial direction), patient in proned/tincreased muscular spasms and trigger points with the patient positioned inprone. Patient was educated on risks and benefits of therapy and verbally consents to PT.   ESTIM+ heat packHiVolt ESTIM61min at patient tolerated230Vto240Vat bilatupper lumbar/lower thoracic paraspinals.With PT assessing patient tolerance throughout (increasing intensity as needed), monitoring skin integrity (normal), with decreased pain noted from patient.During ESTIM treatmentPT obtained survey outcome measures from patient and discussed POC updates with patient with PT and patient agreeing on 1x/week frequency for the next 6 weeks                       PT Education - 04/16/18 1043    Education provided  Yes    Education Details  Stress management to decrease tension    Person(s) Educated  Patient    Methods  Explanation    Comprehension  Verbalized understanding       PT Short Term Goals - 04/07/18 1042      PT SHORT TERM GOAL #1   Title  Pt will be independent with HEP in order to improve strength, flexibility, and balance in order to decrease fall risk and improve function at home and work.    Time  2    Period  Weeks    Status  Achieved        PT Long Term Goals - 04/07/18 1043      PT LONG TERM GOAL #1   Title  Patient will increase FOTO score to 60 to demonstrate predicted increase in functional mobility to complete ADLs    Baseline  04/07/18 58    Time  8    Period  Weeks    Status  On-going      PT LONG TERM GOAL #2   Title  Pt will decrease mODI scoreby at least 13 points in order demonstrate clinically significant reduction in pain/disability    Baseline  04/07/18 16%    Time  8    Period  Weeks    Status  On-going      PT LONG TERM GOAL #3   Title  Pt will decrease 5TSTS by at least 3 seconds in order to demonstrate clinically significant improvement in LE  strength    Baseline  04/07/18 13sec    Time  8    Status  Achieved      PT LONG TERM GOAL #4   Title  Pt will decrease worst pain as reported on NPRS by at least 3 points in order to demonstrate clinically significant reduction in pain.     Baseline  04/07/18: 6/10    Time  8    Period  Weeks    Status  On-going      PT LONG TERM GOAL #5   Title  Pt will increase  hip ext strength by at least 1/2 MMT grade in order to demonstrate improvement in strength and function    Baseline  04/07/18 L 4-/5 R 4+/5    Time  8    Period  Weeks    Status  Achieved      Additional Long Term Goals   Additional Long Term Goals  Yes      PT LONG TERM GOAL #6   Title  Pt will demonstrate symmetrical 4+/5 hip ext strength to safely return to PLOF without pain/compensation d/t assymmetries    Baseline  04/07/18 L 4-/5 R 4+/5    Time  8    Period  Weeks    Status  New            Plan - 04/16/18 1113    Clinical Impression Statement  Patient with increased tension and pain today as she has been unable to complete her HEP stretches over the past couple days, and has had increased social stress. After prolonged manual + modality techniques patient reposts decreased pain, with noted decreased soft tissue restriction. PT educated patient on breath control to decrease tension and maintaining stretching to decrease tension that is increased by stress. Patient verbalized understanding of all education.    Rehab Potential  Good    Clinical Impairments Affecting Rehab Potential  (+) strong social support, motivation (-) chronicity of pain/fibromyalgia sx, psycho-social factors, hx of spine surgery, sedentary lifestyle, age    PT Frequency  1x / week    PT Duration  6 weeks    PT Treatment/Interventions  ADLs/Self Care Home Management;Electrical Stimulation;Aquatic Therapy;Iontophoresis 4mg /ml Dexamethasone;Moist Heat;Traction;Cryotherapy;Ultrasound;Functional mobility training;Manual techniques;Dry needling;Passive  range of motion;Neuromuscular re-education;Balance training;Gait training;Therapeutic exercise;Therapeutic activities;Patient/family education;Energy conservation;Taping    PT Next Visit Plan  Continue lengthening/STM/pain modulation for spinal/hip musculature (focus on L), hip and core strengthening     PT Home Exercise Plan  Self lumbar traction, STS, TA marching, modified R lateral child's pose, seated piriformis stretch    Consulted and Agree with Plan of Care  Patient       Patient will benefit from skilled therapeutic intervention in order to improve the following deficits and impairments:  Increased fascial restricitons, Pain, Improper body mechanics, Decreased mobility, Increased muscle spasms, Impaired tone, Postural dysfunction, Decreased endurance, Decreased activity tolerance, Decreased range of motion, Decreased strength, Decreased balance, Difficulty walking, Impaired flexibility  Visit Diagnosis: Chronic right-sided low back pain without sciatica     Problem List Patient Active Problem List   Diagnosis Date Noted  . Depression, major, in remission ([place]) 01/06/2018  . Hyperlipidemia 06/07/2015  . IBS (irritable bowel syndrome) 03/01/2015  . Depression 03/01/2015  . Fibromyalgia 03/01/2015  . Hypothyroid 03/01/2015  . Pain in joint, shoulder region 01/11/2014  . Cervicalgia 01/11/2014  . History of fusion of cervical spine 01/11/2014  . Headache(784.0) 03/29/2013  . Chronic interstitial cystitis 11/05/2012   Shelton Silvas PT, DPT Shelton Silvas 04/16/2018, 11:29 AM  [facility] [address], [place], [address] Phone: [phone]   Fax:  [phone]  Name: YESLIN DELIO MRN: [id number] Date of Birth: 04-20-1956

## 2018-04-21 ENCOUNTER — Ambulatory Visit: Payer: PPO | Admitting: Physical Therapy

## 2018-04-21 ENCOUNTER — Encounter: Payer: Self-pay | Admitting: Physical Therapy

## 2018-04-21 DIAGNOSIS — M545 Low back pain: Secondary | ICD-10-CM | POA: Diagnosis not present

## 2018-04-21 DIAGNOSIS — G8929 Other chronic pain: Secondary | ICD-10-CM

## 2018-04-21 NOTE — Therapy (Signed)
[facility] [address], [place], [address] Phone: [phone]   Fax:  [phone]  Physical Therapy Treatment  Patient Details  Name: [name] Brown MRN: [id number] Date of Birth: 10/04/1955 Referring Provider: Miguel Aschoff   Encounter Date: 04/21/2018  PT End of Session - 04/21/18 1040    Visit Number  15    Number of Visits  23    Date for PT Re-Evaluation  05/26/18    PT Start Time  1030    PT Stop Time  1115    PT Time Calculation (min)  45 min    Activity Tolerance  Patient tolerated treatment well    Behavior During Therapy  [facility] for tasks assessed/performed       Past Medical History:  Diagnosis Date  . Anxiety   . Cervical spondylosis without myelopathy   . Cervicalgia 01/11/2014  . Degeneration of cervical intervertebral disc   . Endometriosis   . Headache(784.0)   . High cholesterol   . History of fusion of cervical spine 01/11/2014  . IC (interstitial cystitis)   . Myalgia and myositis, unspecified   . Osteoarthrosis, unspecified whether generalized or localized, unspecified site   . Other and unspecified hyperlipidemia   . Pain in joint, shoulder region 01/11/2014  . Pain in joint, upper arm   . Squamous cell carcinoma     Dermatology- place on chest  . Unspecified disorder of bladder     Past Surgical History:  Procedure Laterality Date  . BLADDER SUSPENSION  07/2010   With cystocele repair, Dr. Amalia Hailey  . ELBOW SURGERY Right 08/2011  . HYSTERECTOMY ABDOMINAL WITH SALPINGO-OOPHORECTOMY  01/1996  . SPINAL FUSION  10/2010   C5-C6, C6-C7, Dr. Ellene Route  . TONSILLECTOMY  1969    There were no vitals filed for this visit.  Subjective Assessment - 04/21/18 1035    Subjective  Patient reports decreased LBP 4/10 with some L hip/buttock pain. Patient reports she has still not used heat for her pain as she has not had time, but reports she thinks this would really help. Patient  reports compliance with her HEP with no questions or concerns.     Pertinent History  Pt is a 62 year old female with history of chronic LBP (multiple years) that she reports has been exacerbated following a fall Feb 2019 where she was standing on a chair to swat at a bug and the chair gave away under her. Pt reports she is currently having pain across bilat LB with pain "deep in L buttock" that she reports is dull and achy. Patient reports worst pain in the past week 7/10; best: 5/10. Patient reports she is having difficulty squatting to play with her 49 year old grandson and standing from seated positon. Patient her pain wakes her up in the middle of the night if she lays on the L side. Pt denies N/V, unexplained weight fluctuation, saddle paresthesia, fever, night sweats, B&B changes or unrelenting night pain at this time.    Limitations  Lifting;House hold activities;Walking    How long can you sit comfortably?  unlimited    How long can you stand comfortably?  unlimited    How long can you walk comfortably?  95min    Diagnostic tests  MRI scheduled "if PT does not clear up symptoms"    Pain Onset  More than a month ago         Manual -STM withtrigger point  releaseto bilatupperlumbar paraspinals/lower thoracic paraspinals; prolonged time spent here d/t increased tightness from previous sessions. Following manual techniques: Dry needling(4) 71mm .30needles placed along trigger points in L thoracic/lumbar paraspinals, utilizing shelving technique,and lumbar multifidi,d/tincreased muscular spasms and trigger points with the patient positioned inprone. Patient was educated on risks and benefits of therapy and verbally consents to PT.  -STM withtrigger point releasetoRglute max/med/pirifromis.   Ther-Ex -Bridge exercise 3x 10 with min cuing for proper form -Standing hip abd 3x 10 yellow tband with UE support for balance and min cuing for core activation to decrease lateral trunk  lean   ESTIM+ heat packHiVolt ESTIM62min at patient tolerated190Vto195Vat bilatupper lumbar/lower thoracic paraspinals.With PT assessing patient tolerance throughout (increasing intensity as needed), monitoring skin integrity (normal), with decreased pain noted from patient.During ESTIM treatmentPT reviewed HEP with patient, and gave patient "go ahead" to add bridge and hip abd to HEP as patient wanted to add this                        PT Education - 04/21/18 1038    Education provided  Yes    Education Details  Exercise form    Person(s) Educated  Patient    Methods  Explanation;Verbal cues;Demonstration    Comprehension  Verbalized understanding;Verbal cues required;Returned demonstration       PT Short Term Goals - 04/07/18 1042      PT SHORT TERM GOAL #1   Title  Pt will be independent with HEP in order to improve strength, flexibility, and balance in order to decrease fall risk and improve function at home and work.    Time  2    Period  Weeks    Status  Achieved        PT Long Term Goals - 04/07/18 1043      PT LONG TERM GOAL #1   Title  Patient will increase FOTO score to 60 to demonstrate predicted increase in functional mobility to complete ADLs    Baseline  04/07/18 58    Time  8    Period  Weeks    Status  On-going      PT LONG TERM GOAL #2   Title  Pt will decrease mODI scoreby at least 13 points in order demonstrate clinically significant reduction in pain/disability    Baseline  04/07/18 16%    Time  8    Period  Weeks    Status  On-going      PT LONG TERM GOAL #3   Title  Pt will decrease 5TSTS by at least 3 seconds in order to demonstrate clinically significant improvement in LE strength    Baseline  04/07/18 13sec    Time  8    Status  Achieved      PT LONG TERM GOAL #4   Title  Pt will decrease worst pain as reported on NPRS by at least 3 points in order to demonstrate clinically significant reduction in pain.      Baseline  04/07/18: 6/10    Time  8    Period  Weeks    Status  On-going      PT LONG TERM GOAL #5   Title  Pt will increase hip ext strength by at least 1/2 MMT grade in order to demonstrate improvement in strength and function    Baseline  04/07/18 L 4-/5 R 4+/5    Time  8    Period  Weeks  Status  Achieved      Additional Long Term Goals   Additional Long Term Goals  Yes      PT LONG TERM GOAL #6   Title  Pt will demonstrate symmetrical 4+/5 hip ext strength to safely return to PLOF without pain/compensation d/t assymmetries    Baseline  04/07/18 L 4-/5 R 4+/5    Time  8    Period  Weeks    Status  New            Plan - 04/21/18 1113    Clinical Impression Statement  Patient is continuing to report reduced pain between session, allowing PT to progress therex. Patient was able to complete therex progression with accuracy following PT demo and cuing with no increased pain, only muscle fatigue noted. PT will continue to progress therex as able.     Rehab Potential  Good    Clinical Impairments Affecting Rehab Potential  (+) strong social support, motivation (-) chronicity of pain/fibromyalgia sx, psycho-social factors, hx of spine surgery, sedentary lifestyle, age    PT Frequency  1x / week    PT Duration  6 weeks    PT Treatment/Interventions  ADLs/Self Care Home Management;Electrical Stimulation;Aquatic Therapy;Iontophoresis 4mg /ml Dexamethasone;Moist Heat;Traction;Cryotherapy;Ultrasound;Functional mobility training;Manual techniques;Dry needling;Passive range of motion;Neuromuscular re-education;Balance training;Gait training;Therapeutic exercise;Therapeutic activities;Patient/family education;Energy conservation;Taping    PT Next Visit Plan  Continue lengthening/STM/pain modulation for spinal/hip musculature (focus on L), hip and core strengthening     PT Home Exercise Plan  Self lumbar traction, STS, TA marching, modified R lateral child's pose, seated piriformis stretch     Consulted and Agree with Plan of Care  Patient       Patient will benefit from skilled therapeutic intervention in order to improve the following deficits and impairments:  Increased fascial restricitons, Pain, Improper body mechanics, Decreased mobility, Increased muscle spasms, Impaired tone, Postural dysfunction, Decreased endurance, Decreased activity tolerance, Decreased range of motion, Decreased strength, Decreased balance, Difficulty walking, Impaired flexibility  Visit Diagnosis: Chronic right-sided low back pain without sciatica     Problem List Patient Active Problem List   Diagnosis Date Noted  . Depression, major, in remission ([place]) 01/06/2018  . Hyperlipidemia 06/07/2015  . IBS (irritable bowel syndrome) 03/01/2015  . Depression 03/01/2015  . Fibromyalgia 03/01/2015  . Hypothyroid 03/01/2015  . Pain in joint, shoulder region 01/11/2014  . Cervicalgia 01/11/2014  . History of fusion of cervical spine 01/11/2014  . Headache(784.0) 03/29/2013  . Chronic interstitial cystitis 11/05/2012   Shelton Silvas PT, DPT Shelton Silvas 04/21/2018, 2:55 PM  [facility] [address] [place], [address] Phone: [phone]   Fax:  [phone]  Name: [name] Brown MRN: [id number] Date of Birth: 08/29/1956

## 2018-04-27 ENCOUNTER — Ambulatory Visit: Payer: PPO | Admitting: Physical Therapy

## 2018-04-27 ENCOUNTER — Encounter: Payer: Self-pay | Admitting: Physical Therapy

## 2018-04-27 DIAGNOSIS — G8929 Other chronic pain: Secondary | ICD-10-CM

## 2018-04-27 DIAGNOSIS — M545 Low back pain: Principal | ICD-10-CM

## 2018-04-27 NOTE — Therapy (Signed)
[facility] [address], [place], [address] Phone: [phone]   Fax:  [phone]  Physical Therapy Treatment  Patient Details  Name: [name] Brown MRN: [id number] Date of Birth: Jul 18, 1956 Referring Provider: Miguel Aschoff   Encounter Date: 04/27/2018  PT End of Session - 04/27/18 1358    Visit Number  16    Number of Visits  23    Date for PT Re-Evaluation  05/26/18    PT Start Time  0145    PT Stop Time  0247    PT Time Calculation (min)  62 min    Activity Tolerance  Patient tolerated treatment well    Behavior During Therapy  [facility] for tasks assessed/performed       Past Medical History:  Diagnosis Date  . Anxiety   . Cervical spondylosis without myelopathy   . Cervicalgia 01/11/2014  . Degeneration of cervical intervertebral disc   . Endometriosis   . Headache(784.0)   . High cholesterol   . History of fusion of cervical spine 01/11/2014  . IC (interstitial cystitis)   . Myalgia and myositis, unspecified   . Osteoarthrosis, unspecified whether generalized or localized, unspecified site   . Other and unspecified hyperlipidemia   . Pain in joint, shoulder region 01/11/2014  . Pain in joint, upper arm   . Squamous cell carcinoma    [facility]- place on chest  . Unspecified disorder of bladder     Past Surgical History:  Procedure Laterality Date  . BLADDER SUSPENSION  07/2010   With cystocele repair, Dr. Amalia Hailey  . ELBOW SURGERY Right 08/2011  . HYSTERECTOMY ABDOMINAL WITH SALPINGO-OOPHORECTOMY  01/1996  . SPINAL FUSION  10/2010   C5-C6, C6-C7, Dr. Ellene Route  . TONSILLECTOMY  1969    There were no vitals filed for this visit.  Subjective Assessment - 04/27/18 1351    Subjective  Patient reports her LBP has been "okay" but that she was completing some yard work following last session, and had some increased LB and L hip pain. Patient reports she is doing better overall, but is  frustrated that her pain returns with some activities. Patient reports 4/10 pain today in LB and L buttock. Patient reports compliance with her HEP with no questions or concerns.     Pertinent History  Pt is a 62 year old female with history of chronic LBP (multiple years) that she reports has been exacerbated following a fall Feb 2019 where she was standing on a chair to swat at a bug and the chair gave away under her. Pt reports she is currently having pain across bilat LB with pain "deep in L buttock" that she reports is dull and achy. Patient reports worst pain in the past week 7/10; best: 5/10. Patient reports she is having difficulty squatting to play with her 58 year old grandson and standing from seated positon. Patient her pain wakes her up in the middle of the night if she lays on the L side. Pt denies N/V, unexplained weight fluctuation, saddle paresthesia, fever, night sweats, B&B changes or unrelenting night pain at this time.    Limitations  Lifting;House hold activities;Walking    How long can you sit comfortably?  unlimited    How long can you stand comfortably?  unlimited    How long can you walk comfortably?  68min    Diagnostic tests  MRI scheduled "if PT does not clear up symptoms"    Pain  Onset  More than a month ago           Manual -STM withtrigger point releaseto bilatupperlumbar paraspinals/lower thoracic paraspinals; prolonged time spent here d/t increased tightness from previous sessions. Following manual techniques: Dry needling(4) 62mm .30needles placed along trigger points in L thoracic/lumbar paraspinals, and lumbar multifidi,d/tincreased muscular spasms and trigger points with the patient positioned inprone. Patient was educated on risks and benefits of therapy and verbally consents to PT.  -STM withtrigger point releasetoRglute max/med/pirifromis.  Ther-Ex -Prone superman exercise 3x 8each with demo and max cuing at first for proper form, with good  carry over noted following -Birddog UE alt lift only 2x 8each with demo and max cuing at first for proper form, with good carry over noted following -Palloff press with rotation 2x 10each with cuing for eccentric control for increased core activation  ESTIM+ heat packHiVolt ESTIM6min at patient tolerated195Vto200Vat bilatupper lumbar/lower thoracic paraspinals.With PT assessing patient tolerance throughout (increasing intensity as needed), monitoring skin integrity (normal), with decreased pain noted from patient.During ESTIM treatmentPT discussed aerobic exercises for endorphin release/pain management. Patient reports that she is enjoying aquatic aerobics at her home and PT encouraged this. PT discussed flexibility/breath training, with the possibility of joining a yoga class, or yoga for beginners online from her home  Neuro Re-Ed Discussion with patient about pain experience and impact of other factors (past experience, stress, etc.) and the bottom-up approach to pain. PT utilized examples to ensure understanding of "bottom-up" pain and the importance of signals going "up to the brain" and their impact on pain in addition to mechanical factors. PT encouraged patient that she has made                    PT Education - 04/27/18 1357    Education provided  Yes    Education Details  Exercise form    Person(s) Educated  Patient    Methods  Explanation;Verbal cues    Comprehension  Verbalized understanding;Verbal cues required       PT Short Term Goals - 04/07/18 1042      PT SHORT TERM GOAL #1   Title  Pt will be independent with HEP in order to improve strength, flexibility, and balance in order to decrease fall risk and improve function at home and work.    Time  2    Period  Weeks    Status  Achieved        PT Long Term Goals - 04/07/18 1043      PT LONG TERM GOAL #1   Title  Patient will increase FOTO score to 60 to demonstrate predicted increase in  functional mobility to complete ADLs    Baseline  04/07/18 58    Time  8    Period  Weeks    Status  On-going      PT LONG TERM GOAL #2   Title  Pt will decrease mODI scoreby at least 13 points in order demonstrate clinically significant reduction in pain/disability    Baseline  04/07/18 16%    Time  8    Period  Weeks    Status  On-going      PT LONG TERM GOAL #3   Title  Pt will decrease 5TSTS by at least 3 seconds in order to demonstrate clinically significant improvement in LE strength    Baseline  04/07/18 13sec    Time  8    Status  Achieved      PT  LONG TERM GOAL #4   Title  Pt will decrease worst pain as reported on NPRS by at least 3 points in order to demonstrate clinically significant reduction in pain.     Baseline  04/07/18: 6/10    Time  8    Period  Weeks    Status  On-going      PT LONG TERM GOAL #5   Title  Pt will increase hip ext strength by at least 1/2 MMT grade in order to demonstrate improvement in strength and function    Baseline  04/07/18 L 4-/5 R 4+/5    Time  8    Period  Weeks    Status  Achieved      Additional Long Term Goals   Additional Long Term Goals  Yes      PT LONG TERM GOAL #6   Title  Pt will demonstrate symmetrical 4+/5 hip ext strength to safely return to PLOF without pain/compensation d/t assymmetries    Baseline  04/07/18 L 4-/5 R 4+/5    Time  8    Period  Weeks    Status  New            Plan - 04/27/18 1444    Clinical Impression Statement  Patient expressed some frustration that she feels as though she is making progress, but is worried her occassional pain will "never go away". PT spent time educating patient on pain experience and encouraging patinet, and patient verbalized understanding of all education given. PT progressed therex to include scapular stability in addition to core/pelvic stability, as patient is demonstrating increased tension in tspine paraspinals and bilat UT. Patient was able to toelrate therex progression  with accuracy following PT demo/cuing. PT continued to utilize ESTIM + heat for pain management. PT encouraged patient to continue aerobic exercise outlets for health beenfits and as a pain management strategy.     Rehab Potential  Good    Clinical Impairments Affecting Rehab Potential  (+) strong social support, motivation (-) chronicity of pain/fibromyalgia sx, psycho-social factors, hx of spine surgery, sedentary lifestyle, age    PT Frequency  1x / week    PT Duration  6 weeks    PT Treatment/Interventions  ADLs/Self Care Home Management;Electrical Stimulation;Aquatic Therapy;Iontophoresis 4mg /ml Dexamethasone;Moist Heat;Traction;Cryotherapy;Ultrasound;Functional mobility training;Manual techniques;Dry needling;Passive range of motion;Neuromuscular re-education;Balance training;Gait training;Therapeutic exercise;Therapeutic activities;Patient/family education;Energy conservation;Taping    PT Next Visit Plan  Continue lengthening/STM/pain modulation for spinal/hip musculature (focus on L), hip and core strengthening     PT Home Exercise Plan  Self lumbar traction, STS, TA marching, modified R lateral child's pose, seated piriformis stretch    Consulted and Agree with Plan of Care  Patient       Patient will benefit from skilled therapeutic intervention in order to improve the following deficits and impairments:  Increased fascial restricitons, Pain, Improper body mechanics, Decreased mobility, Increased muscle spasms, Impaired tone, Postural dysfunction, Decreased endurance, Decreased activity tolerance, Decreased range of motion, Decreased strength, Decreased balance, Difficulty walking, Impaired flexibility  Visit Diagnosis: Chronic right-sided low back pain without sciatica     Problem List Patient Active Problem List   Diagnosis Date Noted  . Depression, major, in remission ([place]) 01/06/2018  . Hyperlipidemia 06/07/2015  . IBS (irritable bowel syndrome) 03/01/2015  . Depression  03/01/2015  . Fibromyalgia 03/01/2015  . Hypothyroid 03/01/2015  . Pain in joint, shoulder region 01/11/2014  . Cervicalgia 01/11/2014  . History of fusion of cervical spine 01/11/2014  . Headache(784.0) 03/29/2013  .  Chronic interstitial cystitis 11/05/2012   Shelton Silvas PT, DPT Shelton Silvas 04/27/2018, 3:09 PM  [facility] [address] [place], [address] Phone: [phone]   Fax:  [phone]  Name: [name] Brown MRN: [id number] Date of Birth: 11-Jun-1956

## 2018-04-29 ENCOUNTER — Encounter: Payer: Self-pay | Admitting: Physical Therapy

## 2018-04-29 ENCOUNTER — Ambulatory Visit: Payer: PPO | Admitting: Physical Therapy

## 2018-04-29 DIAGNOSIS — M545 Low back pain: Secondary | ICD-10-CM | POA: Diagnosis not present

## 2018-04-29 DIAGNOSIS — G8929 Other chronic pain: Secondary | ICD-10-CM

## 2018-04-29 NOTE — Therapy (Signed)
[facility] [address] [place], [address] Phone: [phone]   Fax:  [phone]  Physical Therapy Treatment  Patient Details  Name: [name] Brown MRN: [id number] Date of Birth: Sep 23, 1956 Referring Provider: Miguel Aschoff   Encounter Date: 04/29/2018    Past Medical History:  Diagnosis Date  . Anxiety   . Cervical spondylosis without myelopathy   . Cervicalgia 01/11/2014  . Degeneration of cervical intervertebral disc   . Endometriosis   . Headache(784.0)   . High cholesterol   . History of fusion of cervical spine 01/11/2014  . IC (interstitial cystitis)   . Myalgia and myositis, unspecified   . Osteoarthrosis, unspecified whether generalized or localized, unspecified site   . Other and unspecified hyperlipidemia   . Pain in joint, shoulder region 01/11/2014  . Pain in joint, upper arm   . Squamous cell carcinoma    [facility]- place on chest  . Unspecified disorder of bladder     Past Surgical History:  Procedure Laterality Date  . BLADDER SUSPENSION  07/2010   With cystocele repair, Dr. Amalia Hailey  . ELBOW SURGERY Right 08/2011  . HYSTERECTOMY ABDOMINAL WITH SALPINGO-OOPHORECTOMY  01/1996  . SPINAL FUSION  10/2010   C5-C6, C6-C7, Dr. Ellene Route  . TONSILLECTOMY  1969    There were no vitals filed for this visit.      Manual -STM withtrigger point releaseto bilatupperlumbar paraspinals/lower thoracic paraspinals; prolonged time spent here d/t increased tightness from previous sessions. Following manual techniques: Dry needling(4) 39mm .30needles placed along trigger points lumbar paraspinals, and lumbar multifidi,d/tincreased muscular spasms and trigger points with the patient positioned inprone. Patient was educated on risks and benefits of therapy and verbally consents to PT.  -STM withtrigger point releasetoRglute max/med/pirifromis.  Ther-Ex -Prone hip ext x10  each side with TC for glute contraction; + knee bend 2x 10 with continued TC for glute contraction -Mini squat with red tband at knees for hip abd/ER activation 3x 10 with min cuing initially for proper form with good carry over following - Standing hip ext with red tband 2x 10 with min cuing for posture to prevent ant pelvic tilt and for eccentric control   ESTIM+ heat packHiVolt ESTIM11min at patient tolerated250Vupper lumbar/lower thoracic paraspinals.With PT assessing patient tolerance throughout (increasing intensity as needed), monitoring skin integrity (normal), with decreased pain noted from patient.During ESTIM treatmentPTcontinued to encourage HEP and answer patient questions concerning her ability to complete squat/STS "correctly".                          PT Short Term Goals - 04/07/18 1042      PT SHORT TERM GOAL #1   Title  Pt will be independent with HEP in order to improve strength, flexibility, and balance in order to decrease fall risk and improve function at home and work.    Time  2    Period  Weeks    Status  Achieved        PT Long Term Goals - 04/07/18 1043      PT LONG TERM GOAL #1   Title  Patient will increase FOTO score to 60 to demonstrate predicted increase in functional mobility to complete ADLs    Baseline  04/07/18 58    Time  8    Period  Weeks    Status  On-going      PT LONG TERM GOAL #2  Title  Pt will decrease mODI scoreby at least 13 points in order demonstrate clinically significant reduction in pain/disability    Baseline  04/07/18 16%    Time  8    Period  Weeks    Status  On-going      PT LONG TERM GOAL #3   Title  Pt will decrease 5TSTS by at least 3 seconds in order to demonstrate clinically significant improvement in LE strength    Baseline  04/07/18 13sec    Time  8    Status  Achieved      PT LONG TERM GOAL #4   Title  Pt will decrease worst pain as reported on NPRS by at least 3 points in order to  demonstrate clinically significant reduction in pain.     Baseline  04/07/18: 6/10    Time  8    Period  Weeks    Status  On-going      PT LONG TERM GOAL #5   Title  Pt will increase hip ext strength by at least 1/2 MMT grade in order to demonstrate improvement in strength and function    Baseline  04/07/18 L 4-/5 R 4+/5    Time  8    Period  Weeks    Status  Achieved      Additional Long Term Goals   Additional Long Term Goals  Yes      PT LONG TERM GOAL #6   Title  Pt will demonstrate symmetrical 4+/5 hip ext strength to safely return to PLOF without pain/compensation d/t assymmetries    Baseline  04/07/18 L 4-/5 R 4+/5    Time  8    Period  Weeks    Status  New              Patient will benefit from skilled therapeutic intervention in order to improve the following deficits and impairments:     Visit Diagnosis: No diagnosis found.     Problem List Patient Active Problem List   Diagnosis Date Noted  . Depression, major, in remission ([place]) 01/06/2018  . Hyperlipidemia 06/07/2015  . IBS (irritable bowel syndrome) 03/01/2015  . Depression 03/01/2015  . Fibromyalgia 03/01/2015  . Hypothyroid 03/01/2015  . Pain in joint, shoulder region 01/11/2014  . Cervicalgia 01/11/2014  . History of fusion of cervical spine 01/11/2014  . Headache(784.0) 03/29/2013  . Chronic interstitial cystitis 11/05/2012   Shelton Silvas PT, DPT  Shelton Silvas 04/29/2018, 11:17 AM  [facility] [address], [place], [address] Phone: [phone]   Fax:  [phone]  Name: [name] Brown MRN: [id number] Date of Birth: [birth year]

## 2018-05-04 ENCOUNTER — Encounter: Payer: PPO | Admitting: Physical Therapy

## 2018-05-05 ENCOUNTER — Encounter: Payer: Self-pay | Admitting: Physical Therapy

## 2018-05-05 ENCOUNTER — Ambulatory Visit: Payer: PPO | Attending: Family Medicine | Admitting: Physical Therapy

## 2018-05-05 DIAGNOSIS — G8929 Other chronic pain: Secondary | ICD-10-CM | POA: Diagnosis not present

## 2018-05-05 DIAGNOSIS — M545 Low back pain, unspecified: Secondary | ICD-10-CM

## 2018-05-05 NOTE — Therapy (Signed)
Phone: [phone]   Fax:  [phone]  Physical Therapy Treatment  Patient Details  Name: RHIANNE SOMAN MRN: [id number] Date of Birth: 1956/08/27 Referring Provider: Miguel Aschoff   Encounter Date: 05/05/2018  PT End of Session - 05/05/18 1524    Visit Number  18    Number of Visits  23    Date for PT Re-Evaluation  05/26/18    PT Start Time  0315    PT Stop Time  0400    PT Time Calculation (min)  45 min    Activity Tolerance  Patient tolerated treatment well    Behavior During Therapy  [facility] for tasks assessed/performed       Past Medical History:  Diagnosis Date  . Anxiety   . Cervical spondylosis without myelopathy   . Cervicalgia 01/11/2014  . Degeneration of cervical intervertebral disc   . Endometriosis   . Headache(784.0)   . High cholesterol   . History of fusion of cervical spine 01/11/2014  . IC (interstitial cystitis)   . Myalgia and myositis, unspecified   . Osteoarthrosis, unspecified whether generalized or localized, unspecified site   . Other and unspecified hyperlipidemia   . Pain in joint, shoulder region 01/11/2014  . Pain in joint, upper arm   . Squamous cell carcinoma    [facility]- place on chest  . Unspecified disorder of bladder     Past Surgical History:  Procedure Laterality Date  . BLADDER SUSPENSION  07/2010   With cystocele repair, Dr. Amalia Hailey  . ELBOW SURGERY Right 08/2011  . HYSTERECTOMY ABDOMINAL WITH SALPINGO-OOPHORECTOMY  01/1996  . SPINAL FUSION  10/2010   C5-C6, C6-C7, Dr. Ellene Route  . TONSILLECTOMY  1969    There were no vitals filed for this visit.  Subjective Assessment - 05/05/18 1519    Subjective  Patient reports minimal pain over the weekend, until yesterday when she did "a lot of yard work" and was very sore following. When PT asked patient if this is a "muscle soreness" from increased activity, and patient reports her pain is a sharp pain at the L paraspinals and L glute. Patient reports pain increased to 7/10  today. Patient reports compliance with her HEP with no questions or concerns.     Pertinent History  Pt is a 62 year old female with history of chronic LBP (multiple years) that she reports has been exacerbated following a fall Feb 2019 where she was standing on a chair to swat at a bug and the chair gave away under her. Pt reports she is currently having pain across bilat LB with pain "deep in L buttock" that she reports is dull and achy. Patient reports worst pain in the past week 7/10; best: 5/10. Patient reports she is having difficulty squatting to play with her 58 year old grandson and standing from seated positon. Patient her pain wakes her up in the middle of the night if she lays on the L side. Pt denies N/V, unexplained weight fluctuation, saddle paresthesia, fever, night sweats, B&B changes or unrelenting night pain at this time.    Limitations  Lifting;House hold activities;Walking    How long can you sit comfortably?  unlimited    How long can you stand comfortably?  unlimited    How long can you walk comfortably?  36min    Diagnostic tests  MRI scheduled "if PT does not clear up symptoms"    Pain Onset  More than a month ago  Manual -STM withtrigger point releaseto bilatupperlumbar paraspinals/lower thoracic paraspinals; prolonged time spent here d/t increased tightness d/t exacerbation of sx. Following manual techniques: Dry needling(4) 32mm .30needles placed along trigger points lumbar paraspinals, and lumbar multifidi,d/tincreased muscular spasms and trigger points with the patient positioned inprone. Patient was educated on risks and benefits of therapy and verbally consents to PT.  -STM withtrigger point releasetoRglute max/med/pirifromis. - STM with trigger point release to trigger point at L oblique/intercostal (pt complaint of pain) L oblique stretch in sidelying with patient in R sidelying with L obers stretch position, and L UE over head and PT providing  pelvis + rib distraction 4x 30sec holds increasing distraction as able  ESTIM+ heat packHiVolt ESTIM66min at patient tolerated155Vincreased to 160V upper lumbar/lower thoracic paraspinals.With PT assessing patient tolerance throughout (increasing intensity as needed), monitoring skin integrity (normal), with decreased pain noted from patient.During ESTIM treatmentPTeducated patient on tall kneeling posture on mat/foam for yardwork that requires her to be lower to the ground, and continued to encourage squat form > bending forward                          PT Education - 05/05/18 1549    Education provided  Yes    Education Details  Education on gardening posture and modifications    Person(s) Educated  Patient    Methods  Explanation;Verbal cues    Comprehension  Verbalized understanding;Verbal cues required       PT Short Term Goals - 04/07/18 1042      PT SHORT TERM GOAL #1   Title  Pt will be independent with HEP in order to improve strength, flexibility, and balance in order to decrease fall risk and improve function at home and work.    Time  2    Period  Weeks    Status  Achieved        PT Long Term Goals - 04/07/18 1043      PT LONG TERM GOAL #1   Title  Patient will increase FOTO score to 60 to demonstrate predicted increase in functional mobility to complete ADLs    Baseline  04/07/18 58    Time  8    Period  Weeks    Status  On-going      PT LONG TERM GOAL #2   Title  Pt will decrease mODI scoreby at least 13 points in order demonstrate clinically significant reduction in pain/disability    Baseline  04/07/18 16%    Time  8    Period  Weeks    Status  On-going      PT LONG TERM GOAL #3   Title  Pt will decrease 5TSTS by at least 3 seconds in order to demonstrate clinically significant improvement in LE strength    Baseline  04/07/18 13sec    Time  8    Status  Achieved      PT LONG TERM GOAL #4   Title  Pt will decrease worst pain  as reported on NPRS by at least 3 points in order to demonstrate clinically significant reduction in pain.     Baseline  04/07/18: 6/10    Time  8    Period  Weeks    Status  On-going      PT LONG TERM GOAL #5   Title  Pt will increase hip ext strength by at least 1/2 MMT grade in order to demonstrate improvement in strength and function  Baseline  04/07/18 L 4-/5 R 4+/5    Time  8    Period  Weeks    Status  Achieved      Additional Long Term Goals   Additional Long Term Goals  Yes      PT LONG TERM GOAL #6   Title  Pt will demonstrate symmetrical 4+/5 hip ext strength to safely return to PLOF without pain/compensation d/t assymmetries    Baseline  04/07/18 L 4-/5 R 4+/5    Time  8    Period  Weeks    Status  New            Plan - 05/05/18 2249    Clinical Impression Statement  Pt presents with increased pain today following exacerbating from 5-6 hours of yard work to prepare for [employer] bday party at her home this weekend. Following rolonged manual + modality techniques patient reports no pain and has full lumbar ROM. PT continued to educate patient on proper squat positioning during yardwork, and to utilize kneeling on a mat if unable to perform in squat position. PT also educated patient to break up yard work as able, to prevent prolonged hours in poor positioning. Patient verbalized understanding of all provided education.     Rehab Potential  Good    Clinical Impairments Affecting Rehab Potential  (+) strong social support, motivation (-) chronicity of pain/fibromyalgia sx, psycho-social factors, hx of spine surgery, sedentary lifestyle, age    PT Frequency  1x / week    PT Duration  6 weeks    PT Treatment/Interventions  ADLs/Self Care Home Management;Electrical Stimulation;Aquatic Therapy;Iontophoresis 4mg /ml Dexamethasone;Moist Heat;Traction;Cryotherapy;Ultrasound;Functional mobility training;Manual techniques;Dry needling;Passive range of motion;Neuromuscular  re-education;Balance training;Gait training;Therapeutic exercise;Therapeutic activities;Patient/family education;Energy conservation;Taping    PT Next Visit Plan  Continue lengthening/STM/pain modulation for spinal/hip musculature (focus on L), hip and core strengthening     PT Home Exercise Plan  Self lumbar traction, STS, TA marching, modified R lateral child's pose, seated piriformis stretch    Consulted and Agree with Plan of Care  Patient       Patient will benefit from skilled therapeutic intervention in order to improve the following deficits and impairments:  Increased fascial restricitons, Pain, Improper body mechanics, Decreased mobility, Increased muscle spasms, Impaired tone, Postural dysfunction, Decreased endurance, Decreased activity tolerance, Decreased range of motion, Decreased strength, Decreased balance, Difficulty walking, Impaired flexibility  Visit Diagnosis: Acute left-sided low back pain without sciatica     Problem List Patient Active Problem List   Diagnosis Date Noted  . Depression, major, in remission ([place]) 01/06/2018  . Hyperlipidemia 06/07/2015  . IBS (irritable bowel syndrome) 03/01/2015  . Depression 03/01/2015  . Fibromyalgia 03/01/2015  . Hypothyroid 03/01/2015  . Pain in joint, shoulder region 01/11/2014  . Cervicalgia 01/11/2014  . History of fusion of cervical spine 01/11/2014  . Headache(784.0) 03/29/2013  . Chronic interstitial cystitis 11/05/2012   Shelton Silvas PT, DPT Shelton Silvas 05/05/2018, 10:52 PM  [facility] [address] [place], [address] Phone: [phone]   Fax:  [phone]  Name: LAURALYN SHADOWENS MRN: [id number] Date of Birth: [birth year]

## 2018-05-12 ENCOUNTER — Ambulatory Visit: Payer: PPO | Admitting: Physical Therapy

## 2018-05-12 DIAGNOSIS — M545 Low back pain, unspecified: Secondary | ICD-10-CM

## 2018-05-12 DIAGNOSIS — G8929 Other chronic pain: Secondary | ICD-10-CM

## 2018-05-12 NOTE — Therapy (Signed)
[facility] [address] [place], [address] Phone: [phone]   Fax:  [phone]  Physical Therapy Treatment  Patient Details  Name: [name] Brown MRN: [id number] Date of Birth: 06-30-1956 Referring Provider: Miguel Aschoff   Encounter Date: 05/12/2018  PT End of Session - 05/12/18 1442    Visit Number  19    Number of Visits  23    Date for PT Re-Evaluation  05/26/18    PT Start Time  0230    PT Stop Time  0323    PT Time Calculation (min)  53 min    Activity Tolerance  Patient tolerated treatment well    Behavior During Therapy  [facility] for tasks assessed/performed       Past Medical History:  Diagnosis Date  . Anxiety   . Cervical spondylosis without myelopathy   . Cervicalgia 01/11/2014  . Degeneration of cervical intervertebral disc   . Endometriosis   . Headache(784.0)   . High cholesterol   . History of fusion of cervical spine 01/11/2014  . IC (interstitial cystitis)   . Myalgia and myositis, unspecified   . Osteoarthrosis, unspecified whether generalized or localized, unspecified site   . Other and unspecified hyperlipidemia   . Pain in joint, shoulder region 01/11/2014  . Pain in joint, upper arm   . Squamous cell carcinoma    [facility]- place on chest  . Unspecified disorder of bladder     Past Surgical History:  Procedure Laterality Date  . BLADDER SUSPENSION  07/2010   With cystocele repair, Dr. Amalia Hailey  . ELBOW SURGERY Right 08/2011  . HYSTERECTOMY ABDOMINAL WITH SALPINGO-OOPHORECTOMY  01/1996  . SPINAL FUSION  10/2010   C5-C6, C6-C7, Dr. Ellene Route  . TONSILLECTOMY  1969    There were no vitals filed for this visit.  Subjective Assessment - 05/12/18 1432    Subjective  Patient reports she felt "much better" over the weekend and reports she got good pain relief from pelvis/rib distraction. Patient reports after preparing for grandson's birthday party at her home her  pain level stayed at lower 4/10 level through the week. Pt reports 4/10 pain today in low/mid back. patient reports compliance with her HEP with no questions or concerns.     Pertinent History  Pt is a 62 year old female with history of chronic LBP (multiple years) that she reports has been exacerbated following a fall Feb 2019 where she was standing on a chair to swat at a bug and the chair gave away under her. Pt reports she is currently having pain across bilat LB with pain "deep in L buttock" that she reports is dull and achy. Patient reports worst pain in the past week 7/10; best: 5/10. Patient reports she is having difficulty squatting to play with her 41 year old grandson and standing from seated positon. Patient her pain wakes her up in the middle of the night if she lays on the L side. Pt denies N/V, unexplained weight fluctuation, saddle paresthesia, fever, night sweats, B&B changes or unrelenting night pain at this time.    Limitations  Lifting;House hold activities;Walking    How long can you sit comfortably?  unlimited    How long can you stand comfortably?  unlimited    How long can you walk comfortably?  42min    Diagnostic tests  MRI scheduled "if PT does not clear up symptoms"    Pain Onset  More than a  month ago       Manual -STM withtrigger point releaseto bilatupperlumbar paraspinals/lower thoracic paraspinals; prolonged time spent here d/t increased tightness d/t exacerbation of sx. Following manual techniques: Dry needling(4) 66mm .30needles placed along trigger points lumbar paraspinals, and lumbar multifidi,d/tincreased muscular spasms and trigger points with the patient positioned inprone. Patient was educated on risks and benefits of therapy and verbally consents to PT.  -STM withtrigger point releasetoRglute max/med/pirifromis. - STM with trigger point release to trigger point at L oblique/intercostal (pt complaint of pain) L oblique stretch in sidelying with  patient in R sidelying with L obers stretch position, and L UE over head and PT providing pelvis + rib distraction 10sec distraction/10sec relax x20  increasing distraction as able  ESTIM+ heat packHiVolt ESTIM72min at patient tolerated190Vincreased to 200V upper lumbar/lower thoracic paraspinals.With PT assessing patient tolerance throughout (increasing intensity as needed), monitoring skin integrity (normal), with decreased pain noted from patient.During ESTIM treatmentPTeducated patient on self oblique stretch for home pain modulation with visual demonstration   Ther-Ex - Sidestepping with red tband 28ft x3 with maintained mini squat postion - Sidestepping with anti-rotation UE palloff press position red tband 2x 5each direction (stepping out 3x and back 3x) with mod cuing throughout to maintain core activation with anti rotation - TA push downs 3x 10 10# with demo and cuing for proper form with slight knee/hip flexio                     PT Education - 05/12/18 1442    Education provided  Yes    Education Details  Exercise form    Person(s) Educated  Patient    Methods  Explanation;Verbal cues    Comprehension  Verbal cues required;Returned demonstration;Verbalized understanding       PT Short Term Goals - 04/07/18 1042      PT SHORT TERM GOAL #1   Title  Pt will be independent with HEP in order to improve strength, flexibility, and balance in order to decrease fall risk and improve function at home and work.    Time  2    Period  Weeks    Status  Achieved        PT Long Term Goals - 04/07/18 1043      PT LONG TERM GOAL #1   Title  Patient will increase FOTO score to 60 to demonstrate predicted increase in functional mobility to complete ADLs    Baseline  04/07/18 58    Time  8    Period  Weeks    Status  On-going      PT LONG TERM GOAL #2   Title  Pt will decrease mODI scoreby at least 13 points in order demonstrate clinically significant  reduction in pain/disability    Baseline  04/07/18 16%    Time  8    Period  Weeks    Status  On-going      PT LONG TERM GOAL #3   Title  Pt will decrease 5TSTS by at least 3 seconds in order to demonstrate clinically significant improvement in LE strength    Baseline  04/07/18 13sec    Time  8    Status  Achieved      PT LONG TERM GOAL #4   Title  Pt will decrease worst pain as reported on NPRS by at least 3 points in order to demonstrate clinically significant reduction in pain.     Baseline  04/07/18: 6/10  Time  8    Period  Weeks    Status  On-going      PT LONG TERM GOAL #5   Title  Pt will increase hip ext strength by at least 1/2 MMT grade in order to demonstrate improvement in strength and function    Baseline  04/07/18 L 4-/5 R 4+/5    Time  8    Period  Weeks    Status  Achieved      Additional Long Term Goals   Additional Long Term Goals  Yes      PT LONG TERM GOAL #6   Title  Pt will demonstrate symmetrical 4+/5 hip ext strength to safely return to PLOF without pain/compensation d/t assymmetries    Baseline  04/07/18 L 4-/5 R 4+/5    Time  8    Period  Weeks    Status  New            Plan - 05/12/18 1515    Clinical Impression Statement  PT continued to progress core strngthening with dynamic component, as able d/t decreased pain presentation today. Patient reports decreased pain following manual + modality techniques, reporting no pain when leaving the clinic. PT encouraged patient to continue HEP with addition of oblique stretch as needed for pain control. Patient is requiring some cuing for proper form with therex, but is demonstrating good ability to self-correct posture.     Rehab Potential  Good    Clinical Impairments Affecting Rehab Potential  (+) strong social support, motivation (-) chronicity of pain/fibromyalgia sx, psycho-social factors, hx of spine surgery, sedentary lifestyle, age    PT Frequency  1x / week    PT Duration  6 weeks    PT  Treatment/Interventions  ADLs/Self Care Home Management;Electrical Stimulation;Aquatic Therapy;Iontophoresis 4mg /ml Dexamethasone;Moist Heat;Traction;Cryotherapy;Ultrasound;Functional mobility training;Manual techniques;Dry needling;Passive range of motion;Neuromuscular re-education;Balance training;Gait training;Therapeutic exercise;Therapeutic activities;Patient/family education;Energy conservation;Taping    PT Next Visit Plan  Continue lengthening/STM/pain modulation for spinal/hip musculature (focus on L), hip and core strengthening     PT Home Exercise Plan  Self lumbar traction, STS, TA marching, modified R lateral child's pose, seated piriformis stretch    Consulted and Agree with Plan of Care  Patient       Patient will benefit from skilled therapeutic intervention in order to improve the following deficits and impairments:  Increased fascial restricitons, Pain, Improper body mechanics, Decreased mobility, Increased muscle spasms, Impaired tone, Postural dysfunction, Decreased endurance, Decreased activity tolerance, Decreased range of motion, Decreased strength, Decreased balance, Difficulty walking, Impaired flexibility  Visit Diagnosis: Acute left-sided low back pain without sciatica  Chronic right-sided low back pain without sciatica     Problem List Patient Active Problem List   Diagnosis Date Noted  . Depression, major, in remission ([place]) 01/06/2018  . Hyperlipidemia 06/07/2015  . IBS (irritable bowel syndrome) 03/01/2015  . Depression 03/01/2015  . Fibromyalgia 03/01/2015  . Hypothyroid 03/01/2015  . Pain in joint, shoulder region 01/11/2014  . Cervicalgia 01/11/2014  . History of fusion of cervical spine 01/11/2014  . Headache(784.0) 03/29/2013  . Chronic interstitial cystitis 11/05/2012   Shelton Silvas PT, DPT Shelton Silvas 05/12/2018, 3:18 PM  [facility] [address] [place],  [address] Phone: [phone]   Fax:  [phone]  Name: [name] Brown MRN: [id number] Date of Birth: 11/10/1955

## 2018-05-19 ENCOUNTER — Ambulatory Visit: Payer: PPO | Admitting: Physical Therapy

## 2018-05-19 ENCOUNTER — Encounter: Payer: Self-pay | Admitting: Physical Therapy

## 2018-05-19 DIAGNOSIS — M545 Low back pain, unspecified: Secondary | ICD-10-CM

## 2018-05-19 NOTE — Therapy (Signed)
[facility] [address] [place], [address] Phone: [phone]   Fax:  [phone]  Physical Therapy Treatment  Patient Details  Name: [name] Brown MRN: [id number] Date of Birth: 10-Sep-1956 Referring Provider: Miguel Aschoff   Encounter Date: 05/19/2018  PT End of Session - 05/19/18 1406    Visit Number  20    Number of Visits  23    Date for PT Re-Evaluation  05/26/18    PT Start Time  0150    PT Stop Time  0230    PT Time Calculation (min)  40 min    Activity Tolerance  Patient tolerated treatment well    Behavior During Therapy  [facility] for tasks assessed/performed       Past Medical History:  Diagnosis Date  . Anxiety   . Cervical spondylosis without myelopathy   . Cervicalgia 01/11/2014  . Degeneration of cervical intervertebral disc   . Endometriosis   . Headache(784.0)   . High cholesterol   . History of fusion of cervical spine 01/11/2014  . IC (interstitial cystitis)   . Myalgia and myositis, unspecified   . Osteoarthrosis, unspecified whether generalized or localized, unspecified site   . Other and unspecified hyperlipidemia   . Pain in joint, shoulder region 01/11/2014  . Pain in joint, upper arm   . Squamous cell carcinoma     [facility]- place on chest  . Unspecified disorder of bladder     Past Surgical History:  Procedure Laterality Date  . BLADDER SUSPENSION  07/2010   With cystocele repair, Dr. Amalia Hailey  . ELBOW SURGERY Right 08/2011  . HYSTERECTOMY ABDOMINAL WITH SALPINGO-OOPHORECTOMY  01/1996  . SPINAL FUSION  10/2010   C5-C6, C6-C7, Dr. Ellene Route  . TONSILLECTOMY  1969    There were no vitals filed for this visit.  Subjective Assessment - 05/19/18 1353    Subjective  Patient reports 3/10 LB pain with L hip pain increased to 4/10. Patient reports pain is decreasing overall and that she is improving in her "form" with ADLs. Patient reports compliance with her HEP.    Pertinent History  Pt is a 62 year old female with history of chronic LBP (multiple years) that she reports has been exacerbated following a fall Feb 2019 where she was standing on a chair to swat at a bug and the chair gave away under her. Pt reports she is currently having pain across bilat LB with pain "deep in L buttock" that she reports is dull and achy. Patient reports worst pain in the past week 7/10; best: 5/10. Patient reports she is having difficulty squatting to play with her 79 year old grandson and standing from seated positon. Patient her pain wakes her up in the middle of the night if she lays on the L side. Pt denies N/V, unexplained weight fluctuation, saddle paresthesia, fever, night sweats, B&B changes or unrelenting night pain at this time.    Limitations  Lifting;House hold activities;Walking    How long can you sit comfortably?  unlimited    How long can you stand comfortably?  unlimited    How long can you walk comfortably?  84min    Diagnostic tests  MRI scheduled "if PT does not clear up symptoms"    Pain Onset  More than a month ago           Ther-Ex - Upright bike 5 min for LE strengthening with low load - Post pelvic tilt  review on the theraball with patient demonstrating good carry over from previous sessions with min cuing needed - Theraball crunch 3x 10 with demo and cuing for neutral spine and post pelvic tile - Theraball oblique twists x10 with demo and max cuing initially for cuing following; 2x 10 with 5# wt - Bridge with bosu for increased pelvic stability challenge 3x 10  - Ball lumbar traction 2 mins   Manual -STM withtrigger point releaseto bilatupperlumbar paraspinals, and prolonged time spent at L glute max/med/piriformis - Grade I-II L3-S2 CPA 30sec bouts 4 bouts each segment for pain modulation - Long axis hip distraction 10sec traction 10sec relax                  PT Education - 05/19/18 1405    Education provided  Yes     Education Details  Exercise form    Person(s) Educated  Patient    Methods  Explanation;Verbal cues;Demonstration    Comprehension  Verbalized understanding;Verbal cues required;Returned demonstration       PT Short Term Goals - 04/07/18 1042      PT SHORT TERM GOAL #1   Title  Pt will be independent with HEP in order to improve strength, flexibility, and balance in order to decrease fall risk and improve function at home and work.    Time  2    Period  Weeks    Status  Achieved        PT Long Term Goals - 04/07/18 1043      PT LONG TERM GOAL #1   Title  Patient will increase FOTO score to 60 to demonstrate predicted increase in functional mobility to complete ADLs    Baseline  04/07/18 58    Time  8    Period  Weeks    Status  On-going      PT LONG TERM GOAL #2   Title  Pt will decrease mODI scoreby at least 13 points in order demonstrate clinically significant reduction in pain/disability    Baseline  04/07/18 16%    Time  8    Period  Weeks    Status  On-going      PT LONG TERM GOAL #3   Title  Pt will decrease 5TSTS by at least 3 seconds in order to demonstrate clinically significant improvement in LE strength    Baseline  04/07/18 13sec    Time  8    Status  Achieved      PT LONG TERM GOAL #4   Title  Pt will decrease worst pain as reported on NPRS by at least 3 points in order to demonstrate clinically significant reduction in pain.     Baseline  04/07/18: 6/10    Time  8    Period  Weeks    Status  On-going      PT LONG TERM GOAL #5   Title  Pt will increase hip ext strength by at least 1/2 MMT grade in order to demonstrate improvement in strength and function    Baseline  04/07/18 L 4-/5 R 4+/5    Time  8    Period  Weeks    Status  Achieved      Additional Long Term Goals   Additional Long Term Goals  Yes      PT LONG TERM GOAL #6   Title  Pt will demonstrate symmetrical 4+/5 hip ext strength to safely return to PLOF without pain/compensation d/t assymmetries  Baseline  04/07/18 L 4-/5 R 4+/5    Time  8    Period  Weeks    Status  New            Plan - 05/19/18 1451    Clinical Impression Statement  PT continued to progress core strengthening/stability with addition of theraball for dynamic stability challenge. Patient is continuing to report decreased pain with manual techniques as well.     Rehab Potential  Good    Clinical Impairments Affecting Rehab Potential  (+) strong social support, motivation (-) chronicity of pain/fibromyalgia sx, psycho-social factors, hx of spine surgery, sedentary lifestyle, age    PT Frequency  1x / week    PT Duration  6 weeks    PT Treatment/Interventions  ADLs/Self Care Home Management;Electrical Stimulation;Aquatic Therapy;Iontophoresis 4mg /ml Dexamethasone;Moist Heat;Traction;Cryotherapy;Ultrasound;Functional mobility training;Manual techniques;Dry needling;Passive range of motion;Neuromuscular re-education;Balance training;Gait training;Therapeutic exercise;Therapeutic activities;Patient/family education;Energy conservation;Taping    PT Next Visit Plan  REASSESS Continue lengthening/STM/pain modulation for spinal/hip musculature (focus on L), hip and core strengthening     PT Home Exercise Plan  Self lumbar traction, STS, TA marching, modified R lateral child's pose, seated piriformis stretch    Consulted and Agree with Plan of Care  Patient       Patient will benefit from skilled therapeutic intervention in order to improve the following deficits and impairments:  Increased fascial restricitons, Pain, Improper body mechanics, Decreased mobility, Increased muscle spasms, Impaired tone, Postural dysfunction, Decreased endurance, Decreased activity tolerance, Decreased range of motion, Decreased strength, Decreased balance, Difficulty walking, Impaired flexibility  Visit Diagnosis: Acute left-sided low back pain without sciatica     Problem List Patient Active Problem List   Diagnosis Date Noted  .  Depression, major, in remission ([place]) 01/06/2018  . Hyperlipidemia 06/07/2015  . IBS (irritable bowel syndrome) 03/01/2015  . Depression 03/01/2015  . Fibromyalgia 03/01/2015  . Hypothyroid 03/01/2015  . Pain in joint, shoulder region 01/11/2014  . Cervicalgia 01/11/2014  . History of fusion of cervical spine 01/11/2014  . Headache(784.0) 03/29/2013  . Chronic interstitial cystitis 11/05/2012    Shelton Silvas 05/19/2018, 3:47 PM  [facility] [address] [place], [address] Phone: [phone]   Fax:  [phone]  Name: [name] Brown MRN: [id number] Date of Birth: 1956/08/08

## 2018-05-23 ENCOUNTER — Other Ambulatory Visit: Payer: Self-pay | Admitting: Family Medicine

## 2018-05-23 DIAGNOSIS — F329 Major depressive disorder, single episode, unspecified: Secondary | ICD-10-CM

## 2018-05-23 DIAGNOSIS — M542 Cervicalgia: Secondary | ICD-10-CM

## 2018-05-23 DIAGNOSIS — N393 Stress incontinence (female) (male): Secondary | ICD-10-CM

## 2018-05-23 DIAGNOSIS — R52 Pain, unspecified: Secondary | ICD-10-CM

## 2018-05-23 DIAGNOSIS — M25519 Pain in unspecified shoulder: Secondary | ICD-10-CM

## 2018-05-23 DIAGNOSIS — F32A Depression, unspecified: Secondary | ICD-10-CM

## 2018-05-23 DIAGNOSIS — M797 Fibromyalgia: Secondary | ICD-10-CM

## 2018-05-25 ENCOUNTER — Encounter: Payer: Self-pay | Admitting: Physical Therapy

## 2018-05-25 ENCOUNTER — Ambulatory Visit: Payer: PPO | Admitting: Physical Therapy

## 2018-05-25 DIAGNOSIS — G8929 Other chronic pain: Secondary | ICD-10-CM

## 2018-05-25 DIAGNOSIS — M545 Low back pain, unspecified: Secondary | ICD-10-CM

## 2018-05-25 NOTE — Therapy (Signed)
[facility] [address], [place], [address] Phone: [phone]   Fax:  [phone]  Physical Therapy Treatment  Patient Details  Name: [name] Brown MRN: [id number] Date of Birth: 04/25/1956 Referring Provider: Miguel Aschoff   Encounter Date: 05/25/2018  PT End of Session - 05/25/18 1114    Visit Number  21    Number of Visits  27    Date for PT Re-Evaluation  06/30/18    PT Start Time  1030    PT Stop Time  1115    PT Time Calculation (min)  45 min    Activity Tolerance  Patient tolerated treatment well    Behavior During Therapy  [facility] for tasks assessed/performed       Past Medical History:  Diagnosis Date  . Anxiety   . Cervical spondylosis without myelopathy   . Cervicalgia 01/11/2014  . Degeneration of cervical intervertebral disc   . Endometriosis   . Headache(784.0)   . High cholesterol   . History of fusion of cervical spine 01/11/2014  . IC (interstitial cystitis)   . Myalgia and myositis, unspecified   . Osteoarthrosis, unspecified whether generalized or localized, unspecified site   . Other and unspecified hyperlipidemia   . Pain in joint, shoulder region 01/11/2014  . Pain in joint, upper arm   . Squamous cell carcinoma    [facility]- place on chest  . Unspecified disorder of bladder     Past Surgical History:  Procedure Laterality Date  . BLADDER SUSPENSION  07/2010   With cystocele repair, Dr. Amalia Hailey  . ELBOW SURGERY Right 08/2011  . HYSTERECTOMY ABDOMINAL WITH SALPINGO-OOPHORECTOMY  01/1996  . SPINAL FUSION  10/2010   C5-C6, C6-C7, Dr. Ellene Route  . TONSILLECTOMY  1969    There were no vitals filed for this visit.  Subjective Assessment - 05/25/18 1035    Subjective  Patient reports 4-5/10 pain today. Patient reports after last session, her pain did not decrease for as long of a time as it has before. Patient reports compliance with her HEP with no questions or  concerns.     Pertinent History  Pt is a 62 year old female with history of chronic LBP (multiple years) that she reports has been exacerbated following a fall Feb 2019 where she was standing on a chair to swat at a bug and the chair gave away under her. Pt reports she is currently having pain across bilat LB with pain "deep in L buttock" that she reports is dull and achy. Patient reports worst pain in the past week 7/10; best: 5/10. Patient reports she is having difficulty squatting to play with her 57 year old grandson and standing from seated positon. Patient her pain wakes her up in the middle of the night if she lays on the L side. Pt denies N/V, unexplained weight fluctuation, saddle paresthesia, fever, night sweats, B&B changes or unrelenting night pain at this time.    Limitations  Lifting;House hold activities;Walking    How long can you sit comfortably?  unlimited    How long can you stand comfortably?  unlimited    How long can you walk comfortably?  79min    Diagnostic tests  MRI scheduled "if PT does not clear up symptoms"    Pain Onset  More than a month ago         Manual -STM withtrigger point releaseto bilatupperlumbar paraspinals/lower thoracic paraspinals; prolonged time spent here d/t  increased tightnessd/t exacerbation ofsx. Following manual techniques: Dry needling(4) 79mm .30needles placed along trigger points lumbar paraspinals, and lumbar multifidi,d/tincreased muscular spasms and trigger points with the patient positioned inprone. Patient was educated on risks and benefits of therapy and verbally consents to PT.  -STM withtrigger point releasetoRglute max/med/pirifromis.    ESTIM+ heat packHiVolt ESTIM56min at patient tolerated180Vat bilat lumbar paraspinals.With PT assessing patient tolerance throughout (increasing intensity as needed), monitoring skin integrity (normal), with decreased pain noted from patient.During ESTIM treatmentPTcompleted  FOTO with patient and updated HEP; pt verbalized understanding of POC updates    Ther-Ex - Theraball crunch 2x 10 with min cuing for neutral spine and post pelvic tilt with good carry over from last session - Theraball oblique twists 2 x10 with demo and max cuing initially for cuing following; 2x 10 with 5# wt                 PT Education - 05/25/18 1118    Education provided  Yes    Education Details  Exercise form    Person(s) Educated  Patient    Methods  Explanation;Tactile cues;Verbal cues    Comprehension  Verbalized understanding;Verbal cues required;Tactile cues required       PT Short Term Goals - 04/07/18 1042      PT SHORT TERM GOAL #1   Title  Pt will be independent with HEP in order to improve strength, flexibility, and balance in order to decrease fall risk and improve function at home and work.    Time  2    Period  Weeks    Status  Achieved        PT Long Term Goals - 05/25/18 1041      PT LONG TERM GOAL #1   Title  Patient will increase FOTO score to 60 to demonstrate predicted increase in functional mobility to complete ADLs    Baseline  05/25/18: 60    Time  8    Period  Weeks    Status  Achieved      PT LONG TERM GOAL #2   Title  Pt will decrease mODI scoreby at least 13 points in order demonstrate clinically significant reduction in pain/disability    Baseline  05/25/18    Period  Weeks    Status  Achieved      PT LONG TERM GOAL #3   Title  Pt will decrease 5TSTS by at least 3 seconds in order to demonstrate clinically significant improvement in LE strength    Baseline  04/07/18 13sec    Time  8    Period  Weeks    Status  Achieved      PT LONG TERM GOAL #4   Title  Pt will decrease worst pain as reported on NPRS by at least 3 points in order to demonstrate clinically significant reduction in pain.     Baseline  05/25/18 5/10     Time  8    Period  Weeks    Status  On-going      PT LONG TERM GOAL #5   Title  Pt will increase hip  ext strength by at least 1/2 MMT grade in order to demonstrate improvement in strength and function    Baseline  04/07/18 L 4-/5 R 4+/5    Time  8    Period  Weeks    Status  Achieved      PT LONG TERM GOAL #6   Title  Pt will demonstrate symmetrical  4+/5 hip ext strength to safely return to PLOF without pain/compensation d/t assymmetries    Baseline  05/25/18 L 4/5 R 4+/5    Time  8    Period  Weeks    Status  On-going            Plan - 05/25/18 1131    Clinical Impression Statement  PT re-assessed patient, who is continuing to have some pain, but admits she is able to complete much more of her ADLs than before starting PT. Patient is increasing her motor control with neutral spine/pelvis, but is still demonstrating some core weakness (d/t deactivation from lordotic posture with LB ext). Patient is progressing toward goals, and will continue to benefit  for 4 weeks (1x/week) for pain management while transitioning to ind robust HEP to continue maintainence for correcting postural length/tension relationship (core strengthening and LB paraspinal stretching).     Rehab Potential  Good    Clinical Impairments Affecting Rehab Potential  (+) strong social support, motivation (-) chronicity of pain/fibromyalgia sx, psycho-social factors, hx of spine surgery, sedentary lifestyle, age    PT Frequency  1x / week    PT Duration  6 weeks    PT Treatment/Interventions  ADLs/Self Care Home Management;Electrical Stimulation;Aquatic Therapy;Iontophoresis 4mg /ml Dexamethasone;Moist Heat;Traction;Cryotherapy;Ultrasound;Functional mobility training;Manual techniques;Dry needling;Passive range of motion;Neuromuscular re-education;Balance training;Gait training;Therapeutic exercise;Therapeutic activities;Patient/family education;Energy conservation;Taping    PT Next Visit Plan   Continue lengthening/STM/pain modulation for spinal/hip musculature (focus on L), hip and core strengthening     PT Home Exercise  Plan  chair mini crunch and oblique twists, Self lumbar traction, mini squat, TA marching, modified R lateral child's pose, seated piriformis stretch    Consulted and Agree with Plan of Care  Patient       Patient will benefit from skilled therapeutic intervention in order to improve the following deficits and impairments:  Increased fascial restricitons, Pain, Improper body mechanics, Decreased mobility, Increased muscle spasms, Impaired tone, Postural dysfunction, Decreased endurance, Decreased activity tolerance, Decreased range of motion, Decreased strength, Decreased balance, Difficulty walking, Impaired flexibility  Visit Diagnosis: Acute left-sided low back pain without sciatica  Chronic right-sided low back pain without sciatica     Problem List Patient Active Problem List   Diagnosis Date Noted  . Depression, major, in remission ([place]) 01/06/2018  . Hyperlipidemia 06/07/2015  . IBS (irritable bowel syndrome) 03/01/2015  . Depression 03/01/2015  . Fibromyalgia 03/01/2015  . Hypothyroid 03/01/2015  . Pain in joint, shoulder region 01/11/2014  . Cervicalgia 01/11/2014  . History of fusion of cervical spine 01/11/2014  . Headache(784.0) 03/29/2013  . Chronic interstitial cystitis 11/05/2012   Shelton Silvas PT, DPT Shelton Silvas 05/25/2018, 11:39 AM  [facility] [address], [place], [address] Phone: [phone]   Fax:  [phone]  Name: [name] Brown MRN: [id number] Date of Birth: [birth year]

## 2018-05-27 ENCOUNTER — Encounter: Payer: PPO | Admitting: Physical Therapy

## 2018-06-02 ENCOUNTER — Encounter: Payer: Self-pay | Admitting: Physical Therapy

## 2018-06-02 ENCOUNTER — Ambulatory Visit: Payer: PPO | Attending: Family Medicine | Admitting: Physical Therapy

## 2018-06-02 DIAGNOSIS — M545 Low back pain, unspecified: Secondary | ICD-10-CM

## 2018-06-02 DIAGNOSIS — G8929 Other chronic pain: Secondary | ICD-10-CM | POA: Insufficient documentation

## 2018-06-02 NOTE — Therapy (Signed)
[facility] [address], [place], [address] Phone: [phone]   Fax:  [phone]  Physical Therapy Treatment  Patient Details  Name: [name] Brown MRN: [id number] Date of Birth: Nov 18, 1955 Referring Provider: Miguel Aschoff   Encounter Date: 06/02/2018  PT End of Session - 06/02/18 0954    Visit Number  22    Number of Visits  27    Date for PT Re-Evaluation  06/30/18    PT Start Time  0950    PT Stop Time  1030    PT Time Calculation (min)  40 min    Activity Tolerance  Patient tolerated treatment well    Behavior During Therapy  [facility] for tasks assessed/performed       Past Medical History:  Diagnosis Date  . Anxiety   . Cervical spondylosis without myelopathy   . Cervicalgia 01/11/2014  . Degeneration of cervical intervertebral disc   . Endometriosis   . Headache(784.0)   . High cholesterol   . History of fusion of cervical spine 01/11/2014  . IC (interstitial cystitis)   . Myalgia and myositis, unspecified   . Osteoarthrosis, unspecified whether generalized or localized, unspecified site   . Other and unspecified hyperlipidemia   . Pain in joint, shoulder region 01/11/2014  . Pain in joint, upper arm   . Squamous cell carcinoma    [facility]- place on chest  . Unspecified disorder of bladder     Past Surgical History:  Procedure Laterality Date  . BLADDER SUSPENSION  07/2010   With cystocele repair, Dr. Amalia Hailey  . ELBOW SURGERY Right 08/2011  . HYSTERECTOMY ABDOMINAL WITH SALPINGO-OOPHORECTOMY  01/1996  . SPINAL FUSION  10/2010   C5-C6, C6-C7, Dr. Ellene Route  . TONSILLECTOMY  1969    There were no vitals filed for this visit.  Subjective Assessment - 06/02/18 0952    Subjective  Patient reports a minor flare up in back pain on Sunday with unknown cause into L hip. Patient reports 4/10 pain today.     Pertinent History  Pt is a 62 year old female with history of chronic LBP  (multiple years) that she reports has been exacerbated following a fall Feb 2019 where she was standing on a chair to swat at a bug and the chair gave away under her. Pt reports she is currently having pain across bilat LB with pain "deep in L buttock" that she reports is dull and achy. Patient reports worst pain in the past week 7/10; best: 5/10. Patient reports she is having difficulty squatting to play with her 31 year old grandson and standing from seated positon. Patient her pain wakes her up in the middle of the night if she lays on the L side. Pt denies N/V, unexplained weight fluctuation, saddle paresthesia, fever, night sweats, B&B changes or unrelenting night pain at this time.    Limitations  Lifting;House hold activities;Walking    How long can you sit comfortably?  unlimited    How long can you stand comfortably?  unlimited    How long can you walk comfortably?  76min    Diagnostic tests  MRI scheduled "if PT does not clear up symptoms"    Pain Onset  More than a month ago       Ther-Ex - Bridge x 10; adding red tband at knees 2x 10 (advised to continue modification with HEP  - MATRIX hip ext 3x 10 25# with demo and consistent  cuing for proper form at first with good carry over following (Attempted with 40# with patient unable to complete with proper form) - Farmers carry 10# DB in each hand 3x 104ft (16ftdown/back) with max cuing initially for proper posture with core activation; good carry over following - Mini squat with elevated mat table for TC and 10# DB (lowering mat table for each set)    ESTIM+ heat packHiVolt ESTIM12min at patient tolerated210Vincreased to 225V at bilat lumbar paraspinals.With PT assessing patient tolerance throughout (increasing intensity as needed), monitoring skin integrity (normal), with decreased pain noted from patient.During ESTIM treatmentPTeducated patient on muscle soreness "pain" sensation and to take note to where patient feels "soreness"  to ensure she is targeting correct muscle groups.                     PT Education - 06/02/18 0954    Education provided  Yes    Education Details  Exercise form    Person(s) Educated  Patient    Methods  Explanation;Demonstration;Verbal cues    Comprehension  Verbalized understanding;Returned demonstration;Verbal cues required       PT Short Term Goals - 04/07/18 1042      PT SHORT TERM GOAL #1   Title  Pt will be independent with HEP in order to improve strength, flexibility, and balance in order to decrease fall risk and improve function at home and work.    Time  2    Period  Weeks    Status  Achieved        PT Long Term Goals - 05/25/18 1041      PT LONG TERM GOAL #1   Title  Patient will increase FOTO score to 60 to demonstrate predicted increase in functional mobility to complete ADLs    Baseline  05/25/18: 60    Time  8    Period  Weeks    Status  Achieved      PT LONG TERM GOAL #2   Title  Pt will decrease mODI scoreby at least 13 points in order demonstrate clinically significant reduction in pain/disability    Baseline  05/25/18    Period  Weeks    Status  Achieved      PT LONG TERM GOAL #3   Title  Pt will decrease 5TSTS by at least 3 seconds in order to demonstrate clinically significant improvement in LE strength    Baseline  04/07/18 13sec    Time  8    Period  Weeks    Status  Achieved      PT LONG TERM GOAL #4   Title  Pt will decrease worst pain as reported on NPRS by at least 3 points in order to demonstrate clinically significant reduction in pain.     Baseline  05/25/18 5/10     Time  8    Period  Weeks    Status  On-going      PT LONG TERM GOAL #5   Title  Pt will increase hip ext strength by at least 1/2 MMT grade in order to demonstrate improvement in strength and function    Baseline  04/07/18 L 4-/5 R 4+/5    Time  8    Period  Weeks    Status  Achieved      PT LONG TERM GOAL #6   Title  Pt will demonstrate symmetrical  4+/5 hip ext strength to safely return to PLOF without pain/compensation d/t assymmetries  Baseline  05/25/18 L 4/5 R 4+/5    Time  8    Period  Weeks    Status  On-going            Plan - 06/02/18 1009    Clinical Impression Statement  PT progressed compound movements with therex, which patient requires increased cuing and demonstration for patient to complete properly. Patient is able to progress therex with proper form following PT cuing, with increased time to self-correct and to with rest breaks between sets. PT continued to utilize modalities for pain control which patient reports decreased pain following.     Rehab Potential  Good    Clinical Impairments Affecting Rehab Potential  (+) strong social support, motivation (-) chronicity of pain/fibromyalgia sx, psycho-social factors, hx of spine surgery, sedentary lifestyle, age    PT Frequency  1x / week    PT Duration  6 weeks    PT Treatment/Interventions  ADLs/Self Care Home Management;Electrical Stimulation;Aquatic Therapy;Iontophoresis 4mg /ml Dexamethasone;Moist Heat;Traction;Cryotherapy;Ultrasound;Functional mobility training;Manual techniques;Dry needling;Passive range of motion;Neuromuscular re-education;Balance training;Gait training;Therapeutic exercise;Therapeutic activities;Patient/family education;Energy conservation;Taping    PT Next Visit Plan   Continue lengthening/STM/pain modulation for spinal/hip musculature (focus on L), hip and core strengthening     PT Home Exercise Plan  chair mini crunch and oblique twists, Self lumbar traction, mini squat, TA marching, modified R lateral child's pose, seated piriformis stretch    Consulted and Agree with Plan of Care  Patient       Patient will benefit from skilled therapeutic intervention in order to improve the following deficits and impairments:  Increased fascial restricitons, Pain, Improper body mechanics, Decreased mobility, Increased muscle spasms, Impaired tone,  Postural dysfunction, Decreased endurance, Decreased activity tolerance, Decreased range of motion, Decreased strength, Decreased balance, Difficulty walking, Impaired flexibility  Visit Diagnosis: Acute left-sided low back pain without sciatica     Problem List Patient Active Problem List   Diagnosis Date Noted  . Depression, major, in remission ([place]) 01/06/2018  . Hyperlipidemia 06/07/2015  . IBS (irritable bowel syndrome) 03/01/2015  . Depression 03/01/2015  . Fibromyalgia 03/01/2015  . Hypothyroid 03/01/2015  . Pain in joint, shoulder region 01/11/2014  . Cervicalgia 01/11/2014  . History of fusion of cervical spine 01/11/2014  . Headache(784.0) 03/29/2013  . Chronic interstitial cystitis 11/05/2012   Shelton Silvas PT, DPT Shelton Silvas 06/02/2018, 10:41 AM  [facility] [address], [place], [address] Phone: [phone]   Fax:  [phone]  Name: [name] Brown MRN: [id number] Date of Birth: 11-09-1955

## 2018-06-08 ENCOUNTER — Ambulatory Visit: Payer: PPO | Admitting: Physical Therapy

## 2018-06-08 ENCOUNTER — Encounter: Payer: Self-pay | Admitting: Physical Therapy

## 2018-06-08 DIAGNOSIS — M545 Low back pain, unspecified: Secondary | ICD-10-CM

## 2018-06-08 NOTE — Therapy (Signed)
[facility] [address], [place], [address] Phone: [phone]   Fax:  [phone]  Physical Therapy Treatment  Patient Details  Name: [name] Brown MRN: [id number] Date of Birth: [birth year] Referring Provider: Miguel Aschoff   Encounter Date: 06/08/2018  PT End of Session - 06/08/18 1156    Visit Number  23    Number of Visits  27    Date for PT Re-Evaluation  06/30/18    PT Start Time  1120    PT Stop Time  1203    PT Time Calculation (min)  43 min    Activity Tolerance  Patient tolerated treatment well    Behavior During Therapy  [facility] for tasks assessed/performed       Past Medical History:  Diagnosis Date  . Anxiety   . Cervical spondylosis without myelopathy   . Cervicalgia 01/11/2014  . Degeneration of cervical intervertebral disc   . Endometriosis   . Headache(784.0)   . High cholesterol   . History of fusion of cervical spine 01/11/2014  . IC (interstitial cystitis)   . Myalgia and myositis, unspecified   . Osteoarthrosis, unspecified whether generalized or localized, unspecified site   . Other and unspecified hyperlipidemia   . Pain in joint, shoulder region 01/11/2014  . Pain in joint, upper arm   . Squamous cell carcinoma    [facility]- place on chest  . Unspecified disorder of bladder     Past Surgical History:  Procedure Laterality Date  . BLADDER SUSPENSION  07/2010   With cystocele repair, Dr. Amalia Hailey  . ELBOW SURGERY Right 08/2011  . HYSTERECTOMY ABDOMINAL WITH SALPINGO-OOPHORECTOMY  01/1996  . SPINAL FUSION  10/2010   C5-C6, C6-C7, Dr. Ellene Route  . TONSILLECTOMY  1969    There were no vitals filed for this visit.  Subjective Assessment - 06/08/18 1123    Subjective  [place] in the water and she slipped on that step. Patient reports this has increased her cervical and LBP, and reports  increased tension in these areas. Patient reports cervical and lumbar pain/tension 6/10 today. Patient reports compliance with her stretching, and no questions or concerns.     Pertinent History  Pt is a 62 year old female with history of chronic LBP (multiple years) that she reports has been exacerbated following a fall Feb 2019 where she was standing on a chair to swat at a bug and the chair gave away under her. Pt reports she is currently having pain across bilat LB with pain "deep in L buttock" that she reports is dull and achy. Patient reports worst pain in the past week 7/10; best: 5/10. Patient reports she is having difficulty squatting to play with her 6 year old grandson and standing from seated positon. Patient her pain wakes her up in the middle of the night if she lays on the L side. Pt denies N/V, unexplained weight fluctuation, saddle paresthesia, fever, night sweats, B&B changes or unrelenting night pain at this time.    Limitations  Lifting;House hold activities;Walking    How long can you sit comfortably?  unlimited    How long can you stand comfortably?  unlimited    How long can you walk comfortably?  72min    Diagnostic tests  MRI scheduled "if PT does not clear up symptoms"    Pain Onset  More  than a month ago       Manual - STM withtrigger point releaseto bilatupperlumbar paraspinals/lower thoracic paraspinals; prolonged time spent here d/t increased tightness from previous sessions. Following manual techniques: Dry needling(4) 90mm .30needles placed along trigger points in L thoracic/lumbar paraspinals, and lumbar multifidi,d/tincreased muscular spasms and trigger points with the patient positioned inprone. Patient was educated on risks and benefits of therapy and verbally consents to PT.  - STM withtrigger point releaseto bilatuppertrap, and cervical paraspinals; prolonged time spent here d/t increased tightness from previous sessions. Following manual techniques:  Dry needling(4) 45mm .30needles placed along bilat UT trigger pointsd/tincreased muscular spasms and trigger points with the patient positioned insupine. Patient was educated on risks and benefits of therapy and verbally consents to PT.  - L1-5 grade I-II CPA 30sec bouts 4 bouts per segment for pain reduction - Cervical traction 10sec traction/10sec relax x10 for pain management  ESTIM+ heat packHiVolt ESTIM26min at patient tolerated200Vto205Vat bilatupper lumbar/lower thoracic paraspinals.With PT assessing patient tolerance throughout (increasing intensity as needed), monitoring skin integrity (normal), with decreased pain noted from patient.During ESTIM treatmentPTreviewed HEP stretches for maintenance of session gains                         PT Education - 06/08/18 1156    Education provided  Yes    Education Details  Exercise form    Person(s) Educated  Patient    Methods  Explanation    Comprehension  Verbalized understanding       PT Short Term Goals - 04/07/18 1042      PT SHORT TERM GOAL #1   Title  Pt will be independent with HEP in order to improve strength, flexibility, and balance in order to decrease fall risk and improve function at home and work.    Time  2    Period  Weeks    Status  Achieved        PT Long Term Goals - 05/25/18 1041      PT LONG TERM GOAL #1   Title  Patient will increase FOTO score to 60 to demonstrate predicted increase in functional mobility to complete ADLs    Baseline  05/25/18: 60    Time  8    Period  Weeks    Status  Achieved      PT LONG TERM GOAL #2   Title  Pt will decrease mODI scoreby at least 13 points in order demonstrate clinically significant reduction in pain/disability    Baseline  05/25/18    Period  Weeks    Status  Achieved      PT LONG TERM GOAL #3   Title  Pt will decrease 5TSTS by at least 3 seconds in order to demonstrate clinically significant improvement in LE strength     Baseline  04/07/18 13sec    Time  8    Period  Weeks    Status  Achieved      PT LONG TERM GOAL #4   Title  Pt will decrease worst pain as reported on NPRS by at least 3 points in order to demonstrate clinically significant reduction in pain.     Baseline  05/25/18 5/10     Time  8    Period  Weeks    Status  On-going      PT LONG TERM GOAL #5   Title  Pt will increase hip ext strength by at least 1/2 MMT grade in  order to demonstrate improvement in strength and function    Baseline  04/07/18 L 4-/5 R 4+/5    Time  8    Period  Weeks    Status  Achieved      PT LONG TERM GOAL #6   Title  Pt will demonstrate symmetrical 4+/5 hip ext strength to safely return to PLOF without pain/compensation d/t assymmetries    Baseline  05/25/18 L 4/5 R 4+/5    Time  8    Period  Weeks    Status  On-going            Plan - 06/08/18 1315    Clinical Impression Statement  Pt pressents with increased muscle tension causing pain secondary to fall. PT utilized manual and modality techniques for pain relief which patient reports immediate relief from. PT educated patient to continue stretching to maintain gains made through session.     Rehab Potential  Good    Clinical Impairments Affecting Rehab Potential  (+) strong social support, motivation (-) chronicity of pain/fibromyalgia sx, psycho-social factors, hx of spine surgery, sedentary lifestyle, age    PT Frequency  1x / week    PT Duration  6 weeks    PT Treatment/Interventions  ADLs/Self Care Home Management;Electrical Stimulation;Aquatic Therapy;Iontophoresis 4mg /ml Dexamethasone;Moist Heat;Traction;Cryotherapy;Ultrasound;Functional mobility training;Manual techniques;Dry needling;Passive range of motion;Neuromuscular re-education;Balance training;Gait training;Therapeutic exercise;Therapeutic activities;Patient/family education;Energy conservation;Taping    PT Next Visit Plan   Continue lengthening/STM/pain modulation for spinal/hip musculature  (focus on L), hip and core strengthening     PT Home Exercise Plan  chair mini crunch and oblique twists, Self lumbar traction, mini squat, TA marching, modified R lateral child's pose, seated piriformis stretch    Consulted and Agree with Plan of Care  Patient       Patient will benefit from skilled therapeutic intervention in order to improve the following deficits and impairments:  Increased fascial restricitons, Pain, Improper body mechanics, Decreased mobility, Increased muscle spasms, Impaired tone, Postural dysfunction, Decreased endurance, Decreased activity tolerance, Decreased range of motion, Decreased strength, Decreased balance, Difficulty walking, Impaired flexibility  Visit Diagnosis: Acute left-sided low back pain without sciatica     Problem List Patient Active Problem List   Diagnosis Date Noted  . Depression, major, in remission ([place]) 01/06/2018  . Hyperlipidemia 06/07/2015  . IBS (irritable bowel syndrome) 03/01/2015  . Depression 03/01/2015  . Fibromyalgia 03/01/2015  . Hypothyroid 03/01/2015  . Pain in joint, shoulder region 01/11/2014  . Cervicalgia 01/11/2014  . History of fusion of cervical spine 01/11/2014  . Headache(784.0) 03/29/2013  . Chronic interstitial cystitis 11/05/2012   Shelton Silvas PT, DPT Shelton Silvas 06/08/2018, 1:18 PM  [facility] [address], [place], [address] Phone: [phone]   Fax:  [phone]  Name: [name] Brown MRN: [id number] Date of Birth: 02-Aug-1956

## 2018-06-16 ENCOUNTER — Encounter: Payer: Self-pay | Admitting: Physical Therapy

## 2018-06-16 ENCOUNTER — Ambulatory Visit: Payer: PPO | Admitting: Physical Therapy

## 2018-06-16 DIAGNOSIS — M545 Low back pain, unspecified: Secondary | ICD-10-CM

## 2018-06-16 NOTE — Therapy (Signed)
[facility] [address] [place], [address] Phone: [phone]   Fax:  [phone]  Physical Therapy Treatment  Patient Details  Name: [name] Brown MRN: [id number] Date of Birth: [birth year] Referring Provider: Miguel Aschoff   Encounter Date: 06/16/2018  PT End of Session - 06/16/18 1437    Visit Number  24    Number of Visits  27    Date for PT Re-Evaluation  06/30/18    PT Start Time  0230    PT Stop Time  0315    PT Time Calculation (min)  45 min    Activity Tolerance  Patient tolerated treatment well    Behavior During Therapy  [facility] for tasks assessed/performed       Past Medical History:  Diagnosis Date  . Anxiety   . Cervical spondylosis without myelopathy   . Cervicalgia 01/11/2014  . Degeneration of cervical intervertebral disc   . Endometriosis   . Headache(784.0)   . High cholesterol   . History of fusion of cervical spine 01/11/2014  . IC (interstitial cystitis)   . Myalgia and myositis, unspecified   . Osteoarthrosis, unspecified whether generalized or localized, unspecified site   . Other and unspecified hyperlipidemia   . Pain in joint, shoulder region 01/11/2014  . Pain in joint, upper arm   . Squamous cell carcinoma    [facility]- place on chest  . Unspecified disorder of bladder     Past Surgical History:  Procedure Laterality Date  . BLADDER SUSPENSION  07/2010   With cystocele repair, Dr. Amalia Hailey  . ELBOW SURGERY Right 08/2011  . HYSTERECTOMY ABDOMINAL WITH SALPINGO-OOPHORECTOMY  01/1996  . SPINAL FUSION  10/2010   C5-C6, C6-C7, Dr. Ellene Route  . TONSILLECTOMY  1969    There were no vitals filed for this visit.  Subjective Assessment - 06/16/18 1434    Subjective  Patient reports she has been exercising with her sister-in-law at the [facility], and she has had minimal pain with this. Patient reports minimal LBP with some lingering L hip pain that feels "deep" in the L  glute that she describes as "nagging". Patient reports 4/10 pain today. Patient reports compliance with her     Pertinent History  Pt is a 62 year old female with history of chronic LBP (multiple years) that she reports has been exacerbated following a fall Feb 2019 where she was standing on a chair to swat at a bug and the chair gave away under her. Pt reports she is currently having pain across bilat LB with pain "deep in L buttock" that she reports is dull and achy. Patient reports worst pain in the past week 7/10; best: 5/10. Patient reports she is having difficulty squatting to play with her 19 year old grandson and standing from seated positon. Patient her pain wakes her up in the middle of the night if she lays on the L side. Pt denies N/V, unexplained weight fluctuation, saddle paresthesia, fever, night sweats, B&B changes or unrelenting night pain at this time.    Limitations  Lifting;House hold activities;Walking    How long can you sit comfortably?  unlimited    How long can you stand comfortably?  unlimited    How long can you walk comfortably?  60min    Diagnostic tests  MRI scheduled "if PT does not clear up symptoms"    Pain Onset  More than a month ago  Ther-Ex - Bridge 3x 10 on bosu ball with min cuing for eccentric control - Attempted dead bug, with and w/o UE with patient unable to complete with maintaining back in contact with table - Attempted TA marching from mat table and from step. During attempted therex pt became tearful with LBP localized to bilat lumbar paraspinals  Manual - STM to bilat lumbar paraspinals prior to ESTIM (painful to patient so ESTIM utilized instead) - STM with trigger point release to L glute max/min/piriformis during ESTIM   ESTIM+ heat packHiVolt ESTIM68min at patient tolerated210Vincreased to 220V at bilat lumbar paraspinals.With PT assessing patient tolerance throughout (increasing intensity as needed), monitoring skin integrity  (normal), with decreased pain noted from patient.During ESTIM treatmentPTcompleted glute manual techniques and educated patient to not be discouraged with pain during session- pt reported she came from exercising on the elliptical at the [facility], and PT educated her she may just be over-working the paraspinals/core completing PT therex directly after to put pt at ease.                         PT Education - 06/16/18 1438    Education provided  Yes    Education Details  Exercise form    Person(s) Educated  Patient    Methods  Explanation;Verbal cues    Comprehension  Verbalized understanding;Verbal cues required       PT Short Term Goals - 04/07/18 1042      PT SHORT TERM GOAL #1   Title  Pt will be independent with HEP in order to improve strength, flexibility, and balance in order to decrease fall risk and improve function at home and work.    Time  2    Period  Weeks    Status  Achieved        PT Long Term Goals - 05/25/18 1041      PT LONG TERM GOAL #1   Title  Patient will increase FOTO score to 60 to demonstrate predicted increase in functional mobility to complete ADLs    Baseline  05/25/18: 62    Time  8    Period  Weeks    Status  Achieved      PT LONG TERM GOAL #2   Title  Pt will decrease mODI scoreby at least 13 points in order demonstrate clinically significant reduction in pain/disability    Baseline  05/25/18    Period  Weeks    Status  Achieved      PT LONG TERM GOAL #3   Title  Pt will decrease 5TSTS by at least 3 seconds in order to demonstrate clinically significant improvement in LE strength    Baseline  04/07/18 13sec    Time  8    Period  Weeks    Status  Achieved      PT LONG TERM GOAL #4   Title  Pt will decrease worst pain as reported on NPRS by at least 3 points in order to demonstrate clinically significant reduction in pain.     Baseline  05/25/18 62/10     Time  8    Period  Weeks    Status  On-going      PT LONG TERM GOAL  #5   Title  Pt will increase hip ext strength by at least 1/2 MMT grade in order to demonstrate improvement in strength and function    Baseline  04/07/18 L 4-/5 R 4+/5  Time  8    Period  Weeks    Status  Achieved      PT LONG TERM GOAL #6   Title  Pt will demonstrate symmetrical 4+/5 hip ext strength to safely return to PLOF without pain/compensation d/t assymmetries    Baseline  05/25/18 L 4/5 R 4+/5    Time  8    Period  Weeks    Status  On-going            Plan - 06/16/18 1513    Clinical Impression Statement  PT attempted to led patient through therex for hip/core strengthening, but patient was unable to complete without becoming tearful with bilat lumbar paraspinal pain. PT utilized manual + modality treatment to relieve pain, which patient reported was helpful. Pt was discouraged, and PT spent time encouraging her that she had just comopleted elliptical exercise at the gym and that she may just be over-working those muscles to encourage her. Patient verbalized understanding.     Rehab Potential  Good    Clinical Impairments Affecting Rehab Potential  (+) strong social support, motivation (-) chronicity of pain/fibromyalgia sx, psycho-social factors, hx of spine surgery, sedentary lifestyle, age    PT Frequency  1x / week    PT Duration  6 weeks    PT Treatment/Interventions  ADLs/Self Care Home Management;Electrical Stimulation;Aquatic Therapy;Iontophoresis 4mg /ml Dexamethasone;Moist Heat;Traction;Cryotherapy;Ultrasound;Functional mobility training;Manual techniques;Dry needling;Passive range of motion;Neuromuscular re-education;Balance training;Gait training;Therapeutic exercise;Therapeutic activities;Patient/family education;Energy conservation;Taping    PT Next Visit Plan   Continue lengthening/STM/pain modulation for spinal/hip musculature (focus on L), hip and core strengthening     PT Home Exercise Plan  chair mini crunch and oblique twists, Self lumbar traction, mini squat,  TA marching, modified R lateral child's pose, seated piriformis stretch    Consulted and Agree with Plan of Care  Patient       Patient will benefit from skilled therapeutic intervention in order to improve the following deficits and impairments:  Increased fascial restricitons, Pain, Improper body mechanics, Decreased mobility, Increased muscle spasms, Impaired tone, Postural dysfunction, Decreased endurance, Decreased activity tolerance, Decreased range of motion, Decreased strength, Decreased balance, Difficulty walking, Impaired flexibility  Visit Diagnosis: Acute left-sided low back pain without sciatica     Problem List Patient Active Problem List   Diagnosis Date Noted  . Depression, major, in remission ([place]) 01/06/2018  . Hyperlipidemia 06/07/2015  . IBS (irritable bowel syndrome) 03/01/2015  . Depression 03/01/2015  . Fibromyalgia 03/01/2015  . Hypothyroid 03/01/2015  . Pain in joint, shoulder region 01/11/2014  . Cervicalgia 01/11/2014  . History of fusion of cervical spine 01/11/2014  . Headache(784.0) 03/29/2013  . Chronic interstitial cystitis 11/05/2012   Shelton Silvas PT, DPT Shelton Silvas 06/16/2018, 3:18 PM  [facility] [address] [place], [address] Phone: [phone]   Fax:  [phone]  Name: NOVEMBER SYPHER MRN: [id number] Date of Birth: 08-30-1956

## 2018-06-23 ENCOUNTER — Ambulatory Visit: Payer: PPO

## 2018-06-23 DIAGNOSIS — M545 Low back pain, unspecified: Secondary | ICD-10-CM

## 2018-06-23 DIAGNOSIS — G8929 Other chronic pain: Secondary | ICD-10-CM

## 2018-06-23 NOTE — Therapy (Signed)
[facility] [address] [place], [address] Phone: [phone]   Fax:  [phone]  Physical Therapy Treatment  Patient Details  Name: [name] Brown MRN: [id number] Date of Birth: March 15, 1956 Referring Provider: Miguel Aschoff   Encounter Date: 06/23/2018  PT End of Session - 06/23/18 1038    Visit Number  25    Number of Visits  27    Date for PT Re-Evaluation  06/30/18    PT Start Time  3009    PT Stop Time  1115    PT Time Calculation (min)  40 min    Activity Tolerance  Patient tolerated treatment well    Behavior During Therapy  [facility] for tasks assessed/performed       Past Medical History:  Diagnosis Date  . Anxiety   . Cervical spondylosis without myelopathy   . Cervicalgia 01/11/2014  . Degeneration of cervical intervertebral disc   . Endometriosis   . Headache(784.0)   . High cholesterol   . History of fusion of cervical spine 01/11/2014  . IC (interstitial cystitis)   . Myalgia and myositis, unspecified   . Osteoarthrosis, unspecified whether generalized or localized, unspecified site   . Other and unspecified hyperlipidemia   . Pain in joint, shoulder region 01/11/2014  . Pain in joint, upper arm   . Squamous cell carcinoma     Dermatology- place on chest  . Unspecified disorder of bladder     Past Surgical History:  Procedure Laterality Date  . BLADDER SUSPENSION  07/2010   With cystocele repair, Dr. Amalia Hailey  . ELBOW SURGERY Right 08/2011  . HYSTERECTOMY ABDOMINAL WITH SALPINGO-OOPHORECTOMY  01/1996  . SPINAL FUSION  10/2010   C5-C6, C6-C7, Dr. Ellene Route  . TONSILLECTOMY  1969    There were no vitals filed for this visit.  Subjective Assessment - 06/23/18 1037    Subjective  Patient states that her back is doing better and is currently 1-2/10. However L posterior hip is a 4/10. She has continued with her exercise at the [facility] without issue. No specific questions or concerns  currently.     Pertinent History  Pt is a 62 year old female with history of chronic LBP (multiple years) that she reports has been exacerbated following a fall Feb 2019 where she was standing on a chair to swat at a bug and the chair gave away under her. Pt reports she is currently having pain across bilat LB with pain "deep in L buttock" that she reports is dull and achy. Patient reports worst pain in the past week 7/10; best: 5/10. Patient reports she is having difficulty squatting to play with her 55 year old grandson and standing from seated positon. Patient her pain wakes her up in the middle of the night if she lays on the L side. Pt denies N/V, unexplained weight fluctuation, saddle paresthesia, fever, night sweats, B&B changes or unrelenting night pain at this time.    Limitations  Lifting;House hold activities;Walking    How long can you sit comfortably?  unlimited    How long can you stand comfortably?  unlimited    How long can you walk comfortably?  56min    Diagnostic tests  MRI scheduled "if PT does not clear up symptoms"    Currently in Pain?  Yes    Pain Score  4     Pain Location  Hip    Pain Orientation  Left  Pain Type  Chronic pain    Pain Onset  More than a month ago        TREATMENT  Ther-ex  NuStep L1-2 x 4 minutes for warm-up during history; Hooklying bridges x 10, no pain; Hooklying L single leg bridges x 10, no pain;  Manual Assess CPA in lumbar spine without pain; Pt reports reproduction of L low back and L posterior hip pain with L UPA at L3-L5; L UPA grade III, mobilizations 30s/bout x 3 bouts at each level; Extensive STM to L lower lumbar paraspinals and L glute max/med/piriformis. Pt also reports positive reproduction of her pain palpation to L posterior hip musculature;  Trigger Point Dry Needling (TDN), unbilled Education performed with patient regarding potential benefit of TDN. Reviewed precautions and risks with patient. Pt provided verbal  consent to treatment. TDN performed to L lumbar multifidi with 3, 0.30 x 100 single needle placements with local twitch response (LTR) at L3, L4, and L5. Pistoning technique utilized. Lamina contacted with all placements. Improved pain-free motion following intervention.     Pt educated throughout session about proper posture and technique with exercises. Improved exercise technique, movement at target joints, use of target muscles after min to mod verbal, visual, tactile cues.    Pt reports improved pain-free mobility following manual therapy and TDN. She reports positive reproduction of her back and hip pain with both L UPA to L3-L5 and deep palpation of posterior L hip muscles. Pt is able to perform double and single leg bridges at end of session. Pt encouraged to continue HEP and follow-up as scheduled.                      PT Short Term Goals - 04/07/18 1042      PT SHORT TERM GOAL #1   Title  Pt will be independent with HEP in order to improve strength, flexibility, and balance in order to decrease fall risk and improve function at home and work.    Time  2    Period  Weeks    Status  Achieved        PT Long Term Goals - 05/25/18 1041      PT LONG TERM GOAL #1   Title  Patient will increase FOTO score to 60 to demonstrate predicted increase in functional mobility to complete ADLs    Baseline  05/25/18: 60    Time  8    Period  Weeks    Status  Achieved      PT LONG TERM GOAL #2   Title  Pt will decrease mODI scoreby at least 13 points in order demonstrate clinically significant reduction in pain/disability    Baseline  05/25/18    Period  Weeks    Status  Achieved      PT LONG TERM GOAL #3   Title  Pt will decrease 5TSTS by at least 3 seconds in order to demonstrate clinically significant improvement in LE strength    Baseline  04/07/18 13sec    Time  8    Period  Weeks    Status  Achieved      PT LONG TERM GOAL #4   Title  Pt will decrease worst pain as  reported on NPRS by at least 3 points in order to demonstrate clinically significant reduction in pain.     Baseline  05/25/18 5/10     Time  8    Period  Weeks  Status  On-going      PT LONG TERM GOAL #5   Title  Pt will increase hip ext strength by at least 1/2 MMT grade in order to demonstrate improvement in strength and function    Baseline  04/07/18 L 4-/5 R 4+/5    Time  8    Period  Weeks    Status  Achieved      PT LONG TERM GOAL #6   Title  Pt will demonstrate symmetrical 4+/5 hip ext strength to safely return to PLOF without pain/compensation d/t assymmetries    Baseline  05/25/18 L 4/5 R 4+/5    Time  8    Period  Weeks    Status  On-going            Plan - 06/23/18 1038    Clinical Impression Statement  Pt reports improved pain-free mobility following manual therapy and TDN. She reports positive reproduction of her back and hip pain with both L UPA to L3-L5 and deep palpation of posterior L hip muscles. Pt is able to perform double and single leg bridges at end of session. Pt encouraged to continue HEP and follow-up as scheduled.      Rehab Potential  Good    Clinical Impairments Affecting Rehab Potential  (+) strong social support, motivation (-) chronicity of pain/fibromyalgia sx, psycho-social factors, hx of spine surgery, sedentary lifestyle, age    PT Frequency  1x / week    PT Duration  6 weeks    PT Treatment/Interventions  ADLs/Self Care Home Management;Electrical Stimulation;Aquatic Therapy;Iontophoresis 4mg /ml Dexamethasone;Moist Heat;Traction;Cryotherapy;Ultrasound;Functional mobility training;Manual techniques;Dry needling;Passive range of motion;Neuromuscular re-education;Balance training;Gait training;Therapeutic exercise;Therapeutic activities;Patient/family education;Energy conservation;Taping    PT Next Visit Plan   Continue lengthening/STM/pain modulation for spinal/hip musculature (focus on L), hip and core strengthening     PT Home Exercise Plan  chair  mini crunch and oblique twists, Self lumbar traction, mini squat, TA marching, modified R lateral child's pose, seated piriformis stretch    Consulted and Agree with Plan of Care  Patient       Patient will benefit from skilled therapeutic intervention in order to improve the following deficits and impairments:  Increased fascial restricitons, Pain, Improper body mechanics, Decreased mobility, Increased muscle spasms, Impaired tone, Postural dysfunction, Decreased endurance, Decreased activity tolerance, Decreased range of motion, Decreased strength, Decreased balance, Difficulty walking, Impaired flexibility  Visit Diagnosis: Acute left-sided low back pain without sciatica  Chronic right-sided low back pain without sciatica     Problem List Patient Active Problem List   Diagnosis Date Noted  . Depression, major, in remission ([place]) 01/06/2018  . Hyperlipidemia 06/07/2015  . IBS (irritable bowel syndrome) 03/01/2015  . Depression 03/01/2015  . Fibromyalgia 03/01/2015  . Hypothyroid 03/01/2015  . Pain in joint, shoulder region 01/11/2014  . Cervicalgia 01/11/2014  . History of fusion of cervical spine 01/11/2014  . Headache(784.0) 03/29/2013  . Chronic interstitial cystitis 11/05/2012   [name] Brown PT, DPT, GCS  [name] Brown 06/23/2018, 1:33 PM  [facility] [address] [place], [address] Phone: [phone]   Fax:  [phone]  Name: [name] Brown MRN: [id number] Date of Birth: Aug 24, 1956

## 2018-06-23 NOTE — Therapy (Deleted)
[facility] [address] [place], [address] Phone: [phone]   Fax:  [phone]  Physical Therapy Treatment  Patient Details  Name: [name] Brown MRN: [id number] Date of Birth: [birth year] Referring Provider: Miguel Aschoff   Encounter Date: 06/23/2018  PT End of Session - 06/23/18 1038    Visit Number  25    Number of Visits  27    Date for PT Re-Evaluation  06/30/18    PT Start Time  0947    PT Stop Time  1115    PT Time Calculation (min)  40 min    Activity Tolerance  Patient tolerated treatment well    Behavior During Therapy  [facility] for tasks assessed/performed       Past Medical History:  Diagnosis Date  . Anxiety   . Cervical spondylosis without myelopathy   . Cervicalgia 01/11/2014  . Degeneration of cervical intervertebral disc   . Endometriosis   . Headache(784.0)   . High cholesterol   . History of fusion of cervical spine 01/11/2014  . IC (interstitial cystitis)   . Myalgia and myositis, unspecified   . Osteoarthrosis, unspecified whether generalized or localized, unspecified site   . Other and unspecified hyperlipidemia   . Pain in joint, shoulder region 01/11/2014  . Pain in joint, upper arm   . Squamous cell carcinoma    [facility]- place on chest  . Unspecified disorder of bladder     Past Surgical History:  Procedure Laterality Date  . BLADDER SUSPENSION  07/2010   With cystocele repair, Dr. Amalia Hailey  . ELBOW SURGERY Right 08/2011  . HYSTERECTOMY ABDOMINAL WITH SALPINGO-OOPHORECTOMY  01/1996  . SPINAL FUSION  10/2010   C5-C6, C6-C7, Dr. Ellene Route  . TONSILLECTOMY  1969    There were no vitals filed for this visit.  Subjective Assessment - 06/23/18 1037    Subjective  Patient states that her back is doing better and is currently 1-2/10. However L posterior hip is a 4/10. She has continued with her exercise at the [facility] without issue. No specific questions or concerns  currently.     Pertinent History  Pt is a 62 year old female with history of chronic LBP (multiple years) that she reports has been exacerbated following a fall Feb 2019 where she was standing on a chair to swat at a bug and the chair gave away under her. Pt reports she is currently having pain across bilat LB with pain "deep in L buttock" that she reports is dull and achy. Patient reports worst pain in the past week 7/10; best: 5/10. Patient reports she is having difficulty squatting to play with her 51 year old grandson and standing from seated positon. Patient her pain wakes her up in the middle of the night if she lays on the L side. Pt denies N/V, unexplained weight fluctuation, saddle paresthesia, fever, night sweats, B&B changes or unrelenting night pain at this time.    Limitations  Lifting;House hold activities;Walking    How long can you sit comfortably?  unlimited    How long can you stand comfortably?  unlimited    How long can you walk comfortably?  59min    Diagnostic tests  MRI scheduled "if PT does not clear up symptoms"    Currently in Pain?  Yes    Pain Score  4     Pain Location  Hip    Pain Orientation  Left  Pain Type  Chronic pain    Pain Onset  More than a month ago                                 PT Short Term Goals - 04/07/18 1042      PT SHORT TERM GOAL #1   Title  Pt will be independent with HEP in order to improve strength, flexibility, and balance in order to decrease fall risk and improve function at home and work.    Time  2    Period  Weeks    Status  Achieved        PT Long Term Goals - 05/25/18 1041      PT LONG TERM GOAL #1   Title  Patient will increase FOTO score to 60 to demonstrate predicted increase in functional mobility to complete ADLs    Baseline  05/25/18: 60    Time  8    Period  Weeks    Status  Achieved      PT LONG TERM GOAL #2   Title  Pt will decrease mODI scoreby at least 13 points in order demonstrate  clinically significant reduction in pain/disability    Baseline  05/25/18    Period  Weeks    Status  Achieved      PT LONG TERM GOAL #3   Title  Pt will decrease 5TSTS by at least 3 seconds in order to demonstrate clinically significant improvement in LE strength    Baseline  04/07/18 13sec    Time  8    Period  Weeks    Status  Achieved      PT LONG TERM GOAL #4   Title  Pt will decrease worst pain as reported on NPRS by at least 3 points in order to demonstrate clinically significant reduction in pain.     Baseline  05/25/18 5/10     Time  8    Period  Weeks    Status  On-going      PT LONG TERM GOAL #5   Title  Pt will increase hip ext strength by at least 1/2 MMT grade in order to demonstrate improvement in strength and function    Baseline  04/07/18 L 4-/5 R 4+/5    Time  8    Period  Weeks    Status  Achieved      PT LONG TERM GOAL #6   Title  Pt will demonstrate symmetrical 4+/5 hip ext strength to safely return to PLOF without pain/compensation d/t assymmetries    Baseline  05/25/18 L 4/5 R 4+/5    Time  8    Period  Weeks    Status  On-going            Plan - 06/23/18 1038    Rehab Potential  Good    Clinical Impairments Affecting Rehab Potential  (+) strong social support, motivation (-) chronicity of pain/fibromyalgia sx, psycho-social factors, hx of spine surgery, sedentary lifestyle, age    PT Frequency  1x / week    PT Duration  6 weeks    PT Treatment/Interventions  ADLs/Self Care Home Management;Electrical Stimulation;Aquatic Therapy;Iontophoresis 4mg /ml Dexamethasone;Moist Heat;Traction;Cryotherapy;Ultrasound;Functional mobility training;Manual techniques;Dry needling;Passive range of motion;Neuromuscular re-education;Balance training;Gait training;Therapeutic exercise;Therapeutic activities;Patient/family education;Energy conservation;Taping    PT Next Visit Plan   Continue lengthening/STM/pain modulation for spinal/hip musculature (focus on L), hip and core  strengthening  PT Home Exercise Plan  chair mini crunch and oblique twists, Self lumbar traction, mini squat, TA marching, modified R lateral child's pose, seated piriformis stretch    Consulted and Agree with Plan of Care  Patient       Patient will benefit from skilled therapeutic intervention in order to improve the following deficits and impairments:  Increased fascial restricitons, Pain, Improper body mechanics, Decreased mobility, Increased muscle spasms, Impaired tone, Postural dysfunction, Decreased endurance, Decreased activity tolerance, Decreased range of motion, Decreased strength, Decreased balance, Difficulty walking, Impaired flexibility  Visit Diagnosis: Acute left-sided low back pain without sciatica  Chronic right-sided low back pain without sciatica     Problem List Patient Active Problem List   Diagnosis Date Noted  . Depression, major, in remission ([place]) 01/06/2018  . Hyperlipidemia 06/07/2015  . IBS (irritable bowel syndrome) 03/01/2015  . Depression 03/01/2015  . Fibromyalgia 03/01/2015  . Hypothyroid 03/01/2015  . Pain in joint, shoulder region 01/11/2014  . Cervicalgia 01/11/2014  . History of fusion of cervical spine 01/11/2014  . Headache(784.0) 03/29/2013  . Chronic interstitial cystitis 11/05/2012   Lyndel Safe Huprich PT, DPT, GCS  Huprich,Jason 06/23/2018, 1:19 PM  [facility] [address], [place], [address] Phone: [phone]   Fax:  [phone]  Name: [name] Brown MRN: [id number] Date of Birth: January 05, 1956

## 2018-08-08 DIAGNOSIS — J0141 Acute recurrent pansinusitis: Secondary | ICD-10-CM | POA: Diagnosis not present

## 2018-08-18 DIAGNOSIS — K219 Gastro-esophageal reflux disease without esophagitis: Secondary | ICD-10-CM | POA: Diagnosis not present

## 2018-08-18 DIAGNOSIS — R070 Pain in throat: Secondary | ICD-10-CM | POA: Diagnosis not present

## 2018-08-18 DIAGNOSIS — J45991 Cough variant asthma: Secondary | ICD-10-CM | POA: Diagnosis not present

## 2018-08-18 DIAGNOSIS — J309 Allergic rhinitis, unspecified: Secondary | ICD-10-CM | POA: Diagnosis not present

## 2018-09-27 ENCOUNTER — Other Ambulatory Visit: Payer: Self-pay | Admitting: Family Medicine

## 2018-10-02 NOTE — Telephone Encounter (Signed)
Please review, okay approve? KW

## 2018-10-02 NOTE — Telephone Encounter (Signed)
Pt calling back to check on the status of the refill on the cyclobenzaprine (FLEXERIL) 10 MG tablet. Pt has been without it.  Needing for the weekend.  Thanks, [employer]

## 2018-10-02 NOTE — Telephone Encounter (Signed)
[facility] faxed refill request for the following medications:  cyclobenzaprine (FLEXERIL) 10 MG tablet   Please advise.

## 2018-10-05 ENCOUNTER — Ambulatory Visit (INDEPENDENT_AMBULATORY_CARE_PROVIDER_SITE_OTHER): Payer: PPO | Admitting: Family Medicine

## 2018-10-05 ENCOUNTER — Encounter: Payer: Self-pay | Admitting: Family Medicine

## 2018-10-05 VITALS — BP 118/72 | HR 94 | Temp 98.5°F | Resp 16 | Ht 60.0 in | Wt 141.0 lb

## 2018-10-05 DIAGNOSIS — F325 Major depressive disorder, single episode, in full remission: Secondary | ICD-10-CM | POA: Diagnosis not present

## 2018-10-05 DIAGNOSIS — M797 Fibromyalgia: Secondary | ICD-10-CM | POA: Diagnosis not present

## 2018-10-05 DIAGNOSIS — N3010 Interstitial cystitis (chronic) without hematuria: Secondary | ICD-10-CM

## 2018-10-05 DIAGNOSIS — M5030 Other cervical disc degeneration, unspecified cervical region: Secondary | ICD-10-CM

## 2018-10-05 NOTE — Telephone Encounter (Signed)
Please review. Thanks!  

## 2018-10-05 NOTE — Progress Notes (Signed)
[name] Brown  MRN: [id number] DOB: Feb 07, 1956  Subjective:  HPI   The patient is a 63 year old female who presents today for 6 month follow up on her hyperlipidemia.  She was last seen on 02/25/18.  She reports that she was seen by Dr. Tami Ribas 2 months ago, and she now takes Flonase, Barista, and Ship broker. She has a follow up with him this week.  Overall she feels well.She continues to have back pain.She functions well day to day.  Patient Active Problem List   Diagnosis Date Noted  . Depression, major, in remission ([place]) 01/06/2018  . Hyperlipidemia 06/07/2015  . IBS (irritable bowel syndrome) 03/01/2015  . Depression 03/01/2015  . Fibromyalgia 03/01/2015  . Hypothyroid 03/01/2015  . Pain in joint, shoulder region 01/11/2014  . Cervicalgia 01/11/2014  . History of fusion of cervical spine 01/11/2014  . Headache(784.0) 03/29/2013  . Chronic interstitial cystitis 11/05/2012    Past Medical History:  Diagnosis Date  . Anxiety   . Cervical spondylosis without myelopathy   . Cervicalgia 01/11/2014  . Degeneration of cervical intervertebral disc   . Endometriosis   . Headache(784.0)   . High cholesterol   . History of fusion of cervical spine 01/11/2014  . IC (interstitial cystitis)   . Myalgia and myositis, unspecified   . Osteoarthrosis, unspecified whether generalized or localized, unspecified site   . Other and unspecified hyperlipidemia   . Pain in joint, shoulder region 01/11/2014  . Pain in joint, upper arm   . Squamous cell carcinoma    [facility]- place on chest  . Unspecified disorder of bladder     Social History   Socioeconomic History  . Marital status: Married    Spouse name: Liliane Channel  . Number of children: 2  . Years of education: College  . Highest education level: Not on file  Occupational History  . Not on file  Social Needs  . Financial resource strain: Not on file  . Food insecurity:    Worry: Not on file    Inability: Not on file  .  Transportation needs:    Medical: Not on file    Non-medical: Not on file  Tobacco Use  . Smoking status: Never Smoker  . Smokeless tobacco: Never Used  Substance and Sexual Activity  . Alcohol use: Yes    Comment: 1 every 2 weeks  . Drug use: No  . Sexual activity: Not Currently    Partners: Male    Birth control/protection: Surgical    Comment: LAVH  Lifestyle  . Physical activity:    Days per week: Not on file    Minutes per session: Not on file  . Stress: Not on file  Relationships  . Social connections:    Talks on phone: Not on file    Gets together: Not on file    Attends religious service: Not on file    Active member of club or organization: Not on file    Attends meetings of clubs or organizations: Not on file    Relationship status: Not on file  . Intimate partner violence:    Fear of current or ex partner: Not on file    Emotionally abused: Not on file    Physically abused: Not on file    Forced sexual activity: Not on file  Other Topics Concern  . Not on file  Social History Narrative   Patient is married IT sales professional) and lives at home with her husband.   Patient  has a Transport planner.   Patient drinks one can of Diet Dr. Malachi Bonds daily.   Patient has a college education.   Patient is right- handed.   Patient has two children and two step-children.    Outpatient Encounter Medications as of 10/05/2018  Medication Sig Note  . amitriptyline (ELAVIL) 25 MG tablet TAKE ONE TABLET BY MOUTH AT BEDTIME   . cyclobenzaprine (FLEXERIL) 10 MG tablet TAKE 1 TABLET BY MOUTH AT BEDTIME   . DULoxetine (CYMBALTA) 60 MG capsule TAKE 1 CAPSULE BY MOUTH ONCE DAILY   . ezetimibe (ZETIA) 10 MG tablet Take 1 tablet (10 mg total) by mouth daily.   . lansoprazole (PREVACID) 30 MG capsule Take 20 mg by mouth.  12/04/2015: Received from: [facility]  . levothyroxine (SYNTHROID, LEVOTHROID) 25 MCG tablet TAKE 1 TABLET BY MOUTH ONCE DAILY BEFORE BREAKFAST   . Magnesium  500 MG CAPS Take 1 capsule by mouth at bedtime.   . polyethylene glycol (MIRALAX / GLYCOLAX) packet Take 17 g by mouth daily.   . psyllium (REGULOID) 0.52 g capsule Take 0.52 g by mouth daily.   Marland Kitchen azithromycin (ZITHROMAX) 250 MG tablet Take 2 tablets on the first day then 1 daily until finished. (Patient not taking: Reported on 10/05/2018)   . diazepam (VALIUM) 2 MG tablet Take 1 tablet (2 mg total) by mouth every 12 (twelve) hours as needed for anxiety. (Patient not taking: Reported on 01/27/2018)   . naproxen (NAPROSYN) 375 MG tablet Take 1 tablet (375 mg total) by mouth 2 (two) times daily as needed. (Patient not taking: Reported on 01/27/2018)    No facility-administered encounter medications on file as of 10/05/2018.     Allergies  Allergen Reactions  . Codeine     hallucination  . Hydrocodone   . Levofloxacin Other (See Comments)  . Minocin [Minocycline]   . Nsaids Other (See Comments)    Other reaction(s): OTHER Other reaction(s): OTHER    Review of Systems  Constitutional: Positive for malaise/fatigue (chronic). Negative for fever.  Eyes: Negative.   Respiratory: Negative for cough, shortness of breath and wheezing.   Cardiovascular: Negative for chest pain, palpitations, orthopnea, claudication and leg swelling.  Gastrointestinal: Negative.   Musculoskeletal: Positive for back pain, myalgias and neck pain.  Skin: Negative.   Neurological: Negative for dizziness, weakness and headaches.  Endo/Heme/Allergies: Negative.   Psychiatric/Behavioral: Positive for depression (much improved but not totally gone). Negative for hallucinations, memory loss, substance abuse and suicidal ideas. The patient is nervous/anxious (mild) and has insomnia (mild-mod).     Objective:  BP 118/72 (BP Location: Left Arm, Patient Position: Sitting, Cuff Size: Normal)   Pulse 94   Temp 98.5 F (36.9 C)   Resp 16   Ht 5' (1.524 m)   Wt 141 lb (64 kg)   LMP 01/28/1997   SpO2 96%   BMI 27.54 kg/m    Physical Exam  Constitutional: She is oriented to person, place, and time and well-developed, well-nourished, and in no distress.  HENT:  Head: Normocephalic and atraumatic.  Right Ear: External ear normal.  Left Ear: External ear normal.  Nose: Nose normal.  Eyes: Conjunctivae are normal.  Neck: No thyromegaly present.  Cardiovascular: Normal rate, regular rhythm and normal heart sounds.  Pulmonary/Chest: Effort normal and breath sounds normal.  Abdominal: Soft. Bowel sounds are normal.  Lymphadenopathy:    She has no cervical adenopathy.  Neurological: She is alert and oriented to person, place, and time. Gait  normal. GCS score is 15.  Skin: Skin is warm and dry.  Psychiatric: Mood, memory, affect and judgment normal.    Assessment and Plan :   1. DDD (degenerative disc disease), cervical Stable.  2. Chronic interstitial cystitis   3. Fibromyalgia Controlled.   4. Depression, major, in remission ([place]) In partial remission. Doing well.RTC 6 months.  I have done the exam and reviewed the chart and it is accurate to the best of my knowledge. Development worker, community has been used and  any errors in dictation or transcription are unintentional. Miguel Aschoff M.D. [facility]

## 2018-10-06 DIAGNOSIS — R0982 Postnasal drip: Secondary | ICD-10-CM | POA: Diagnosis not present

## 2018-10-06 DIAGNOSIS — K219 Gastro-esophageal reflux disease without esophagitis: Secondary | ICD-10-CM | POA: Diagnosis not present

## 2018-10-06 DIAGNOSIS — J45991 Cough variant asthma: Secondary | ICD-10-CM | POA: Diagnosis not present

## 2018-10-09 DIAGNOSIS — M5030 Other cervical disc degeneration, unspecified cervical region: Secondary | ICD-10-CM | POA: Insufficient documentation

## 2018-10-21 ENCOUNTER — Other Ambulatory Visit: Payer: Self-pay | Admitting: Obstetrics & Gynecology

## 2018-10-21 DIAGNOSIS — Z1231 Encounter for screening mammogram for malignant neoplasm of breast: Secondary | ICD-10-CM

## 2018-11-04 DIAGNOSIS — J029 Acute pharyngitis, unspecified: Secondary | ICD-10-CM | POA: Diagnosis not present

## 2018-11-20 ENCOUNTER — Other Ambulatory Visit: Payer: Self-pay | Admitting: Family Medicine

## 2018-11-20 DIAGNOSIS — F32A Depression, unspecified: Secondary | ICD-10-CM

## 2018-11-20 DIAGNOSIS — M797 Fibromyalgia: Secondary | ICD-10-CM

## 2018-11-20 DIAGNOSIS — M25519 Pain in unspecified shoulder: Secondary | ICD-10-CM

## 2018-11-20 DIAGNOSIS — N393 Stress incontinence (female) (male): Secondary | ICD-10-CM

## 2018-11-20 DIAGNOSIS — M542 Cervicalgia: Secondary | ICD-10-CM

## 2018-11-20 DIAGNOSIS — F329 Major depressive disorder, single episode, unspecified: Secondary | ICD-10-CM

## 2018-11-20 DIAGNOSIS — R52 Pain, unspecified: Secondary | ICD-10-CM

## 2018-11-23 ENCOUNTER — Ambulatory Visit
Admission: RE | Admit: 2018-11-23 | Discharge: 2018-11-23 | Disposition: A | Discharge status: Home / Self Care | Payer: PPO | Source: Ambulatory Visit | Attending: Obstetrics & Gynecology | Admitting: Obstetrics & Gynecology

## 2018-11-23 DIAGNOSIS — Z1231 Encounter for screening mammogram for malignant neoplasm of breast: Secondary | ICD-10-CM

## 2018-11-23 DIAGNOSIS — M8589 Other specified disorders of bone density and structure, multiple sites: Secondary | ICD-10-CM | POA: Diagnosis not present

## 2018-11-23 DIAGNOSIS — Z90710 Acquired absence of both cervix and uterus: Secondary | ICD-10-CM | POA: Diagnosis not present

## 2018-11-24 ENCOUNTER — Other Ambulatory Visit: Payer: Self-pay | Admitting: Obstetrics & Gynecology

## 2018-11-24 DIAGNOSIS — R928 Other abnormal and inconclusive findings on diagnostic imaging of breast: Secondary | ICD-10-CM

## 2018-11-27 ENCOUNTER — Telehealth: Payer: Self-pay | Admitting: *Deleted

## 2018-11-27 ENCOUNTER — Other Ambulatory Visit: Payer: Self-pay | Admitting: Family

## 2018-11-27 ENCOUNTER — Ambulatory Visit
Admission: RE | Admit: 2018-11-27 | Discharge: 2018-11-27 | Disposition: A | Discharge status: Home / Self Care | Payer: PPO | Source: Ambulatory Visit | Attending: Obstetrics & Gynecology | Admitting: Obstetrics & Gynecology

## 2018-11-27 DIAGNOSIS — R922 Inconclusive mammogram: Secondary | ICD-10-CM | POA: Diagnosis not present

## 2018-11-27 DIAGNOSIS — R928 Other abnormal and inconclusive findings on diagnostic imaging of breast: Secondary | ICD-10-CM

## 2018-11-27 NOTE — Telephone Encounter (Signed)
Called patient. Informed of BMD results as written by Dr. Sabra Heck.   Report to scan.  Encounter closed.

## 2018-11-30 ENCOUNTER — Encounter: Payer: Self-pay | Admitting: Obstetrics & Gynecology

## 2018-12-19 ENCOUNTER — Other Ambulatory Visit: Payer: Self-pay | Admitting: Family Medicine

## 2018-12-19 DIAGNOSIS — E7849 Other hyperlipidemia: Secondary | ICD-10-CM

## 2018-12-22 ENCOUNTER — Other Ambulatory Visit: Payer: Self-pay | Admitting: Family Medicine

## 2018-12-22 DIAGNOSIS — E7849 Other hyperlipidemia: Secondary | ICD-10-CM

## 2019-02-17 DIAGNOSIS — D225 Melanocytic nevi of trunk: Secondary | ICD-10-CM | POA: Diagnosis not present

## 2019-02-17 DIAGNOSIS — D2271 Melanocytic nevi of right lower limb, including hip: Secondary | ICD-10-CM | POA: Diagnosis not present

## 2019-02-17 DIAGNOSIS — D2262 Melanocytic nevi of left upper limb, including shoulder: Secondary | ICD-10-CM | POA: Diagnosis not present

## 2019-02-17 DIAGNOSIS — D2272 Melanocytic nevi of left lower limb, including hip: Secondary | ICD-10-CM | POA: Diagnosis not present

## 2019-02-17 DIAGNOSIS — Z08 Encounter for follow-up examination after completed treatment for malignant neoplasm: Secondary | ICD-10-CM | POA: Diagnosis not present

## 2019-02-17 DIAGNOSIS — L718 Other rosacea: Secondary | ICD-10-CM | POA: Diagnosis not present

## 2019-02-17 DIAGNOSIS — D2261 Melanocytic nevi of right upper limb, including shoulder: Secondary | ICD-10-CM | POA: Diagnosis not present

## 2019-02-17 DIAGNOSIS — Z85828 Personal history of other malignant neoplasm of skin: Secondary | ICD-10-CM | POA: Diagnosis not present

## 2019-03-02 ENCOUNTER — Other Ambulatory Visit: Payer: Self-pay

## 2019-03-02 ENCOUNTER — Ambulatory Visit (INDEPENDENT_AMBULATORY_CARE_PROVIDER_SITE_OTHER): Payer: PPO | Admitting: Family Medicine

## 2019-03-02 ENCOUNTER — Encounter: Payer: Self-pay | Admitting: Family Medicine

## 2019-03-02 VITALS — BP 118/76 | HR 102 | Temp 98.4°F | Resp 18 | Wt 140.6 lb

## 2019-03-02 DIAGNOSIS — M797 Fibromyalgia: Secondary | ICD-10-CM | POA: Diagnosis not present

## 2019-03-02 DIAGNOSIS — F325 Major depressive disorder, single episode, in full remission: Secondary | ICD-10-CM

## 2019-03-02 DIAGNOSIS — E663 Overweight: Secondary | ICD-10-CM

## 2019-03-02 DIAGNOSIS — Z981 Arthrodesis status: Secondary | ICD-10-CM

## 2019-03-02 DIAGNOSIS — M5030 Other cervical disc degeneration, unspecified cervical region: Secondary | ICD-10-CM | POA: Diagnosis not present

## 2019-03-02 DIAGNOSIS — E7849 Other hyperlipidemia: Secondary | ICD-10-CM

## 2019-03-02 NOTE — Progress Notes (Signed)
Patient: [name] Brown Female    DOB: Mar 04, 1956   63 y.o.   MRN: [id number] Visit Date: 03/02/2019  Today's Provider: Wilhemena Durie, MD   Chief Complaint  Patient presents with  . Follow-up   Subjective:     HPI  6 month follow up.  Overall chronic pains are stable.  Patient does not get much exercise.  She has frustrated with inability to lose weight.She feels well. Son recently just married and she is excited about this.  Allergies  Allergen Reactions  . Codeine     hallucination  . Hydrocodone   . Levofloxacin Other (See Comments)  . Minocin [Minocycline]   . Nsaids Other (See Comments)    Other reaction(s): OTHER Other reaction(s): OTHER     Current Outpatient Medications:  .  amitriptyline (ELAVIL) 25 MG tablet, TAKE ONE TABLET BY MOUTH AT BEDTIME, Disp: 90 tablet, Rfl: 3 .  cyclobenzaprine (FLEXERIL) 10 MG tablet, TAKE 1 TABLET BY MOUTH AT BEDTIME, Disp: 30 tablet, Rfl: 11 .  DULoxetine (CYMBALTA) 60 MG capsule, TAKE 1 CAPSULE BY MOUTH ONCE DAILY, Disp: 90 capsule, Rfl: 3 .  ezetimibe (ZETIA) 10 MG tablet, Take 1 tablet by mouth once daily, Disp: 90 tablet, Rfl: 3 .  levothyroxine (SYNTHROID, LEVOTHROID) 25 MCG tablet, TAKE 1 TABLET BY MOUTH ONCE DAILY BEFORE BREAKFAST, Disp: 30 tablet, Rfl: 12 .  Magnesium 500 MG CAPS, Take 1 capsule by mouth at bedtime., Disp: , Rfl:  .  polyethylene glycol (MIRALAX / GLYCOLAX) packet, Take 17 g by mouth daily., Disp: , Rfl:  .  psyllium (REGULOID) 0.52 g capsule, Take 0.52 g by mouth daily., Disp: , Rfl:  .  azithromycin (ZITHROMAX) 250 MG tablet, Take 2 tablets on the first day then 1 daily until finished. (Patient not taking: Reported on 10/05/2018), Disp: 6 tablet, Rfl: 0 .  diazepam (VALIUM) 2 MG tablet, Take 1 tablet (2 mg total) by mouth every 12 (twelve) hours as needed for anxiety. (Patient not taking: Reported on 01/27/2018), Disp: 30 tablet, Rfl: 1 .  lansoprazole (PREVACID) 30 MG capsule, Take 20 mg by mouth.  , Disp: , Rfl:  .  naproxen (NAPROSYN) 375 MG tablet, Take 1 tablet (375 mg total) by mouth 2 (two) times daily as needed. (Patient not taking: Reported on 01/27/2018), Disp: 60 tablet, Rfl: 3  Review of Systems  Constitutional: Negative.   HENT: Negative.   Eyes: Negative.   Respiratory: Negative.   Cardiovascular: Negative.   Gastrointestinal: Negative.   Endocrine: Negative.   Genitourinary: Negative.   Musculoskeletal: Positive for back pain.  Skin: Negative.   Allergic/Immunologic: Negative.   Neurological: Negative.   Hematological: Negative.   Psychiatric/Behavioral: Negative.   All other systems reviewed and are negative.   Social History   Tobacco Use  . Smoking status: Never Smoker  . Smokeless tobacco: Never Used  Substance Use Topics  . Alcohol use: Yes    Comment: 1 every 2 weeks      Objective:   BP 118/76 (BP Location: Left Arm, Patient Position: Sitting, Cuff Size: Normal)   Pulse (!) 102   Temp 98.4 F (36.9 C) (Oral)   Resp 18   Wt 140 lb 9.6 oz (63.8 kg)   LMP 01/28/1997   SpO2 98%   BMI 27.46 kg/m  Vitals:   03/02/19 1113  BP: 118/76  Pulse: (!) 102  Resp: 18  Temp: 98.4 F (36.9 C)  TempSrc: Oral  SpO2: 98%  Weight: 140 lb 9.6 oz (63.8 kg)     Physical Exam Vitals signs reviewed.  Constitutional:      Appearance: She is well-developed.  HENT:     Head: Normocephalic and atraumatic.  Eyes:     General: No scleral icterus. Neck:     Thyroid: No thyromegaly.  Cardiovascular:     Rate and Rhythm: Normal rate and regular rhythm.     Heart sounds: Normal heart sounds.  Pulmonary:     Effort: Pulmonary effort is normal.     Breath sounds: Normal breath sounds.  Abdominal:     Palpations: Abdomen is soft.  Skin:    General: Skin is warm and dry.  Neurological:     Mental Status: She is alert and oriented to person, place, and time. Mental status is at baseline.     Motor: No abnormal muscle tone.     Coordination: Coordination  normal.     Deep Tendon Reflexes: Reflexes normal.  Psychiatric:        Mood and Affect: Mood normal.        Behavior: Behavior normal.        Thought Content: Thought content normal.        Judgment: Judgment normal.         Assessment & Plan    1. DDD (degenerative disc disease), cervical   2. Fibromyalgia Monitor patient medications on each visit.  3. Depression, major, in remission ([place]) Continue present meds.  4. History of fusion of cervical spine   5. Other hyperlipidemia  6.Overweight Try low phentermine for a couple of months.    [name] Brown Mon, MD  [facility]

## 2019-03-06 MED ORDER — PHENTERMINE HCL 15 MG PO TBDP: 15.0000 mg | ORAL_TABLET | Freq: Every morning | ORAL | 2 refills | Status: DC

## 2019-04-16 ENCOUNTER — Other Ambulatory Visit: Payer: Self-pay

## 2019-04-20 ENCOUNTER — Ambulatory Visit (INDEPENDENT_AMBULATORY_CARE_PROVIDER_SITE_OTHER): Payer: PPO | Admitting: Obstetrics & Gynecology

## 2019-04-20 ENCOUNTER — Encounter: Payer: Self-pay | Admitting: Obstetrics & Gynecology

## 2019-04-20 ENCOUNTER — Other Ambulatory Visit: Payer: Self-pay

## 2019-04-20 VITALS — BP 110/64 | HR 92 | Temp 97.1°F | Ht 59.25 in | Wt 139.0 lb

## 2019-04-20 DIAGNOSIS — Z01419 Encounter for gynecological examination (general) (routine) without abnormal findings: Secondary | ICD-10-CM

## 2019-04-20 NOTE — Progress Notes (Signed)
62 y.o. [id number] Married White or Caucasian female here for annual exam.  Doing well.  Denies vaginal bleeding.    Had sciatica issues last year.  She did several weeks of PT last year.  This helped.    PCP:  Dr. Rosanna Randy.  Has blood work scheduled at follow-up.    Patient's last menstrual period was 01/28/1997.          Sexually active: No.  The current method of family planning is status post hysterectomy.    Exercising: Yes.    swimming  Smoker:  no  Health Maintenance: Pap:  10/14/16 Neg  History of abnormal Pap:  yes MMG:  11/27/18 Diagnostic Left BIRADS1:neg. F/u 1 year  Colonoscopy:  08/12/17 f/u 10 years  BMD:   11/23/18 Osteopenia  TDaP:  2018 Pneumonia vaccine(s):  no Shingrix:  D/w pt  Hep C testing: 12/11/16 Neg  Screening Labs: PCP   reports that she has never smoked. She has never used smokeless tobacco. She reports current alcohol use of about 1.0 standard drinks of alcohol per week. She reports that she does not use drugs.  Past Medical History:  Diagnosis Date  . Anxiety   . Cervical spondylosis without myelopathy   . Cervicalgia 01/11/2014  . Degeneration of cervical intervertebral disc   . Endometriosis   . Headache(784.0)   . High cholesterol   . History of fusion of cervical spine 01/11/2014  . IC (interstitial cystitis)   . Myalgia and myositis, unspecified   . Osteoarthrosis, unspecified whether generalized or localized, unspecified site   . Other and unspecified hyperlipidemia   . Pain in joint, shoulder region 01/11/2014  . Pain in joint, upper arm   . Squamous cell carcinoma    [facility]- place on chest  . Unspecified disorder of bladder     Past Surgical History:  Procedure Laterality Date  . BLADDER SUSPENSION  07/2010   With cystocele repair, Dr. Amalia Hailey  . ELBOW SURGERY Right 08/2011  . HYSTERECTOMY ABDOMINAL WITH SALPINGO-OOPHORECTOMY  01/1996  . SPINAL FUSION  10/2010   C5-C6, C6-C7, Dr. Ellene Route  . TONSILLECTOMY  1969    Current  Outpatient Medications  Medication Sig Dispense Refill  . amitriptyline (ELAVIL) 25 MG tablet TAKE ONE TABLET BY MOUTH AT BEDTIME 90 tablet 3  . cyclobenzaprine (FLEXERIL) 10 MG tablet TAKE 1 TABLET BY MOUTH AT BEDTIME 30 tablet 11  . DULoxetine (CYMBALTA) 60 MG capsule TAKE 1 CAPSULE BY MOUTH ONCE DAILY 90 capsule 3  . ezetimibe (ZETIA) 10 MG tablet Take 1 tablet by mouth once daily 90 tablet 3  . levothyroxine (SYNTHROID, LEVOTHROID) 25 MCG tablet TAKE 1 TABLET BY MOUTH ONCE DAILY BEFORE BREAKFAST 30 tablet 12  . Magnesium 500 MG CAPS Take 1 capsule by mouth at bedtime.    . Multiple Vitamin (MULTI-VITAMIN) tablet Take 1 tablet by mouth daily.    . naproxen (NAPROSYN) 375 MG tablet Take 1 tablet (375 mg total) by mouth 2 (two) times daily as needed. 60 tablet 3  . polyethylene glycol (MIRALAX / GLYCOLAX) packet Take 17 g by mouth daily.    . psyllium (REGULOID) 0.52 g capsule Take 0.52 g by mouth daily.     No current facility-administered medications for this visit.     Family History  Problem Relation Age of Onset  . Osteoarthritis Mother   . Other Mother        has nipple sparing mastectomies due to "abnormal breast finding"  . Heart disease Father   .  Osteoarthritis Maternal Grandfather        Rheumatoid  . Cancer Paternal Grandmother        tongue & throat  . Breast cancer Maternal Aunt 68  . Melanoma Maternal Aunt 69  . Breast cancer Maternal Aunt     Review of Systems  All other systems reviewed and are negative.   Exam:   BP 110/64   Pulse 92   Temp (!) 97.1 F (36.2 C) (Temporal)   Ht 4' 11.25" (1.505 m)   Wt 139 lb (63 kg)   LMP 01/28/1997   BMI 27.84 kg/m   Height:   Height: 4' 11.25" (150.5 cm)  Ht Readings from Last 3 Encounters:  04/20/19 4' 11.25" (1.505 m)  10/05/18 5' (1.524 m)  01/27/18 4' 11.5" (1.511 m)    General appearance: alert, cooperative and appears stated age Head: Normocephalic, without obvious abnormality, atraumatic Neck: no  adenopathy, supple, symmetrical, trachea midline and thyroid normal to inspection and palpation Lungs: clear to auscultation bilaterally Breasts: normal appearance, no masses or tenderness Heart: regular rate and rhythm Abdomen: soft, non-tender; bowel sounds normal; no masses,  no organomegaly Extremities: extremities normal, atraumatic, no cyanosis or edema Skin: Skin color, texture, turgor normal. No rashes or lesions Lymph nodes: Cervical, supraclavicular, and axillary nodes normal. No abnormal inguinal nodes palpated Neurologic: Grossly normal   Pelvic: External genitalia:  no lesions              Urethra:  normal appearing urethra with no masses, tenderness or lesions              Bartholins and Skenes: normal                 Vagina: normal appearing vagina with normal color and discharge, no lesions              Cervix: absent              Pap taken: No. Bimanual Exam:  Uterus:  uterus absent              Adnexa: no mass, fullness, tenderness               Rectovaginal: Confirms               Anus:  normal sphincter tone, no lesions  Chaperone was present for exam.  A:  Well Woman with normal exam PMP, no HRT Fibromyalgia IC on amitriptyline Hypothyroidism Mildly elevated lipids H/O TAH/BSO Cystocele and sling placement 11/11  P:   Mammogram guidelines reviewed.  Doing 3D MMG. pap smear not indicated Lab work is done with Dr. Rosanna Randy Pt is going to get the shingrix vaccination this year Return annually or prn

## 2019-04-29 ENCOUNTER — Other Ambulatory Visit: Payer: Self-pay | Admitting: Family Medicine

## 2019-04-29 DIAGNOSIS — M797 Fibromyalgia: Secondary | ICD-10-CM

## 2019-04-29 DIAGNOSIS — R519 Headache, unspecified: Secondary | ICD-10-CM

## 2019-04-29 DIAGNOSIS — R52 Pain, unspecified: Secondary | ICD-10-CM

## 2019-06-02 ENCOUNTER — Ambulatory Visit: Payer: Self-pay | Admitting: Family Medicine

## 2019-06-13 DIAGNOSIS — Z1159 Encounter for screening for other viral diseases: Secondary | ICD-10-CM | POA: Diagnosis not present

## 2019-08-30 DIAGNOSIS — K219 Gastro-esophageal reflux disease without esophagitis: Secondary | ICD-10-CM | POA: Diagnosis not present

## 2019-08-30 DIAGNOSIS — J324 Chronic pansinusitis: Secondary | ICD-10-CM | POA: Diagnosis not present

## 2019-08-30 DIAGNOSIS — R070 Pain in throat: Secondary | ICD-10-CM | POA: Diagnosis not present

## 2019-09-19 ENCOUNTER — Other Ambulatory Visit: Payer: Self-pay | Admitting: Family Medicine

## 2019-09-19 NOTE — Telephone Encounter (Signed)
Requested medication (s) are due for refill today: yes  Requested medication (s) are on the active medication list: yes  Last refill:  22/20/2020  Future visit scheduled: yes  Notes to clinic:  refill cannot be delegated    Requested Prescriptions  Pending Prescriptions Disp Refills   cyclobenzaprine (FLEXERIL) 10 MG tablet [Pharmacy Med Name: Cyclobenzaprine HCl 10 MG Oral Tablet] 30 tablet 0    Sig: TAKE 1 TABLET BY MOUTH AT BEDTIME      Not Delegated - Analgesics:  Muscle Relaxants Failed - 09/19/2019  6:30 AM      Failed - This refill cannot be delegated      Failed - Valid encounter within last 6 months    Recent Outpatient Visits           6 months ago DDD (degenerative disc disease), cervical   [facility] Jerrol Banana., MD   11 months ago Chronic interstitial cystitis   [facility] Jerrol Banana., MD   1 year ago Chronic low back pain with sciatica, sciatica laterality unspecified, unspecified back pain laterality   [facility] Jerrol Banana., MD   1 year ago Chronic low back pain with sciatica, sciatica laterality unspecified, unspecified back pain laterality   [facility] Jerrol Banana., MD   1 year ago Other hyperlipidemia   [facility] Jerrol Banana., MD       Future Appointments             In 2 weeks Jerrol Banana., MD [facility], [place]

## 2019-09-27 NOTE — Progress Notes (Signed)
Patient: [name] Brown Female    DOB: 01/26/1956   63 y.o.   MRN: [id number] Visit Date: 10/05/2019  Today's Provider: Wilhemena Durie, MD   Chief Complaint  Patient presents with  . Obesity  . Fibromyalgia  . Depression   Subjective:    HPI   Follow up for overweight  The patient was last seen for this 6 months ago. Changes made at last visit include try low phentermine.  She reports patient never got medication compliance with treatment. She feels that condition is Unchanged. She is not having side effects.   Wt Readings from Last 3 Encounters:  10/05/19 144 lb 12.8 oz (65.7 kg)  04/20/19 139 lb (63 kg)  03/02/19 140 lb 9.6 oz (63.8 kg)     ------------------------------------------------------------------------------------   Follow up for fibromyalgia Stable. The patient was last seen for this 6 months ago. Changes made at last visit include no changes.  She reports excellent compliance with treatment. She feels that condition is Unchanged. She is not having side effects.   ------------------------------------------------------------------------------------   Depression, Follow-up Stable. She  was last seen for this 6 months ago. Changes made at last visit include no changes, continue current medications.   She reports excellent compliance with treatment. She is not having side effects.   She reports excellent tolerance of treatment. Current symptoms include: fatigue and insomnia She feels she is Unchanged since last visit.  Depression screen [facility] 2/9 10/05/2019 03/02/2019 01/06/2018 12/18/2016  Decreased Interest 0 0 0 1  Down, Depressed, Hopeless 0 0 1 1  PHQ - 2 Score 0 0 1 2  Altered sleeping 1 - 1 2  Tired, decreased energy 2 - 1 3  Change in appetite 1 - 1 0  Feeling bad or failure about yourself  0 - 0 0  Trouble concentrating 0 - 0 0  Moving slowly or fidgety/restless 0 - 0 2  Suicidal thoughts 0 - 0 0  PHQ-9 Score 4 - 4 9    Difficult doing work/chores Not difficult at all - Not difficult at all -    ------------------------------------------------------------------------   Hypothyroid, follow-up:  TSH  Date Value Ref Range Status  02/19/2018 2.120 0.450 - 4.500 uIU/mL Final  12/11/2016 2.680 0.450 - 4.500 uIU/mL Final  06/23/2015 1.920 0.450 - 4.500 uIU/mL Final   Wt Readings from Last 3 Encounters:  10/05/19 144 lb 12.8 oz (65.7 kg)  04/20/19 139 lb (63 kg)  03/02/19 140 lb 9.6 oz (63.8 kg)    She was last seen for hypothyroid 6 months ago.  Management since that visit includes checking labs. She reports excellent compliance with treatment. She is not having side effects.  She is not exercising. She is experiencing none She denies change in energy level, heat / cold intolerance and weight changes Weight trend: stable  ------------------------------------------------------------------------  Allergies  Allergen Reactions  . Codeine     hallucination  . Hydrocodone   . Levofloxacin Other (See Comments)  . Minocin [Minocycline]   . Nsaids Other (See Comments)    Other reaction(s): OTHER Other reaction(s): OTHER     Current Outpatient Medications:  .  amitriptyline (ELAVIL) 25 MG tablet, TAKE 1 TABLET BY MOUTH AT BEDTIME, Disp: 90 tablet, Rfl: 3 .  cyclobenzaprine (FLEXERIL) 10 MG tablet, TAKE 1 TABLET BY MOUTH AT BEDTIME, Disp: 30 tablet, Rfl: 5 .  DULoxetine (CYMBALTA) 60 MG capsule, TAKE 1 CAPSULE BY MOUTH ONCE DAILY, Disp: 90 capsule, Rfl: 3 .  ezetimibe (ZETIA) 10 MG tablet, Take 1 tablet by mouth once daily, Disp: 90 tablet, Rfl: 3 .  levothyroxine (SYNTHROID, LEVOTHROID) 25 MCG tablet, TAKE 1 TABLET BY MOUTH ONCE DAILY BEFORE BREAKFAST, Disp: 30 tablet, Rfl: 12 .  Magnesium 500 MG CAPS, Take 1 capsule by mouth at bedtime., Disp: , Rfl:  .  Multiple Vitamin (MULTI-VITAMIN) tablet, Take 1 tablet by mouth daily., Disp: , Rfl:  .  omeprazole (PRILOSEC) 40 MG capsule, Take 40 mg by  mouth daily., Disp: , Rfl:  .  polyethylene glycol (MIRALAX / GLYCOLAX) packet, Take 17 g by mouth daily., Disp: , Rfl:  .  psyllium (REGULOID) 0.52 g capsule, Take 0.52 g by mouth daily., Disp: , Rfl:   Review of Systems  Constitutional: Positive for appetite change and fatigue. Negative for chills and fever.  Eyes: Negative.   Respiratory: Negative for chest tightness and shortness of breath.   Cardiovascular: Negative for chest pain and palpitations.  Gastrointestinal: Negative for abdominal pain, nausea and vomiting.  Musculoskeletal: Positive for arthralgias, back pain and myalgias.  Allergic/Immunologic: Negative.   Neurological: Negative for dizziness and weakness.  Psychiatric/Behavioral: Positive for sleep disturbance.    Social History   Tobacco Use  . Smoking status: Never Smoker  . Smokeless tobacco: Never Used  Substance Use Topics  . Alcohol use: Yes    Alcohol/week: 1.0 standard drinks    Types: 1 Glasses of wine per week      Objective:   BP 116/74 (BP Location: Left Arm, Patient Position: Sitting, Cuff Size: Normal)   Pulse 98   Temp (!) 97.3 F (36.3 C) (Temporal)   Resp 16   Ht 4\' 11"  (1.499 m)   Wt 144 lb 12.8 oz (65.7 kg)   LMP 01/28/1997   BMI 29.25 kg/m  Vitals:   10/05/19 1156  BP: 116/74  Pulse: 98  Resp: 16  Temp: (!) 97.3 F (36.3 C)  TempSrc: Temporal  Weight: 144 lb 12.8 oz (65.7 kg)  Height: 4\' 11"  (1.499 m)  Body mass index is 29.25 kg/m.   Physical Exam Vitals reviewed.  Constitutional:      Appearance: She is well-developed.  HENT:     Head: Normocephalic and atraumatic.  Eyes:     General: No scleral icterus.    Conjunctiva/sclera: Conjunctivae normal.  Neck:     Thyroid: No thyromegaly.  Cardiovascular:     Rate and Rhythm: Normal rate and regular rhythm.     Heart sounds: Normal heart sounds.  Pulmonary:     Effort: Pulmonary effort is normal.     Breath sounds: Normal breath sounds.  Abdominal:     Palpations:  Abdomen is soft.  Skin:    General: Skin is warm and dry.  Neurological:     General: No focal deficit present.     Mental Status: She is alert and oriented to person, place, and time.  Psychiatric:        Mood and Affect: Mood normal.        Behavior: Behavior normal.        Thought Content: Thought content normal.        Judgment: Judgment normal.      No results found for any visits on 10/05/19.     Assessment & Plan    1. Fibromyalgia Presently stable on Elavil and as needed amitriptyline and duloxetine - Comprehensive metabolic panel  2. Depression, major, in remission ([place]) On duloxetine - CBC with Differential/Platelet - Comprehensive metabolic  panel  3. Other hyperlipidemia  - Lipid Panel With LDL/HDL Ratio  4. Hypothyroidism, unspecified type  - TSH     Richard Cranford Mon, MD  [facility]

## 2019-10-05 ENCOUNTER — Encounter: Payer: Self-pay | Admitting: Family Medicine

## 2019-10-05 ENCOUNTER — Ambulatory Visit (INDEPENDENT_AMBULATORY_CARE_PROVIDER_SITE_OTHER): Payer: PPO | Admitting: Family Medicine

## 2019-10-05 VITALS — BP 116/74 | HR 98 | Temp 97.3°F | Resp 16 | Ht 59.0 in | Wt 144.8 lb

## 2019-10-05 DIAGNOSIS — M797 Fibromyalgia: Secondary | ICD-10-CM

## 2019-10-05 DIAGNOSIS — E039 Hypothyroidism, unspecified: Secondary | ICD-10-CM | POA: Diagnosis not present

## 2019-10-05 DIAGNOSIS — F325 Major depressive disorder, single episode, in full remission: Secondary | ICD-10-CM | POA: Diagnosis not present

## 2019-10-05 DIAGNOSIS — E7849 Other hyperlipidemia: Secondary | ICD-10-CM

## 2019-10-12 DIAGNOSIS — M797 Fibromyalgia: Secondary | ICD-10-CM | POA: Diagnosis not present

## 2019-10-12 DIAGNOSIS — E039 Hypothyroidism, unspecified: Secondary | ICD-10-CM | POA: Diagnosis not present

## 2019-10-12 DIAGNOSIS — E7849 Other hyperlipidemia: Secondary | ICD-10-CM | POA: Diagnosis not present

## 2019-10-12 DIAGNOSIS — F325 Major depressive disorder, single episode, in full remission: Secondary | ICD-10-CM | POA: Diagnosis not present

## 2019-10-13 ENCOUNTER — Telehealth: Payer: Self-pay

## 2019-10-13 LAB — CBC WITH DIFFERENTIAL/PLATELET
Basophils Absolute: 0.2 10*3/uL (ref 0.0–0.2)
Basos: 2 %
EOS (ABSOLUTE): 0.9 10*3/uL — ABNORMAL HIGH (ref 0.0–0.4)
Eos: 12 %
Hematocrit: 41.4 % (ref 34.0–46.6)
Hemoglobin: 13.7 g/dL (ref 11.1–15.9)
Immature Grans (Abs): 0 10*3/uL (ref 0.0–0.1)
Immature Granulocytes: 0 %
Lymphocytes Absolute: 2.4 10*3/uL (ref 0.7–3.1)
Lymphs: 34 %
MCH: 29.5 pg (ref 26.6–33.0)
MCHC: 33.1 g/dL (ref 31.5–35.7)
MCV: 89 fL (ref 79–97)
Monocytes Absolute: 0.5 10*3/uL (ref 0.1–0.9)
Monocytes: 7 %
Neutrophils Absolute: 3.3 10*3/uL (ref 1.4–7.0)
Neutrophils: 45 %
Platelets: 307 10*3/uL (ref 150–450)
RBC: 4.65 x10E6/uL (ref 3.77–5.28)
RDW: 13.1 % (ref 11.7–15.4)
WBC: 7.2 10*3/uL (ref 3.4–10.8)

## 2019-10-13 LAB — LIPID PANEL WITH LDL/HDL RATIO
Cholesterol, Total: 235 mg/dL — ABNORMAL HIGH (ref 100–199)
HDL: 71 mg/dL (ref >39)
LDL Chol Calc (NIH): 149 mg/dL — ABNORMAL HIGH (ref 0–99)
LDL/HDL Ratio: 2.1 ratio (ref 0.0–3.2)
Triglycerides: 86 mg/dL (ref 0–149)
VLDL Cholesterol Cal: 15 mg/dL (ref 5–40)

## 2019-10-13 LAB — COMPREHENSIVE METABOLIC PANEL
ALT: 46 IU/L — ABNORMAL HIGH (ref 0–32)
AST: 30 IU/L (ref 0–40)
Albumin/Globulin Ratio: 1.6 (ref 1.2–2.2)
Albumin: 4.3 g/dL (ref 3.8–4.8)
Alkaline Phosphatase: 92 IU/L (ref 39–117)
BUN/Creatinine Ratio: 28 (ref 12–28)
BUN: 21 mg/dL (ref 8–27)
Bilirubin Total: 0.3 mg/dL (ref 0.0–1.2)
CO2: 24 mmol/L (ref 20–29)
Calcium: 9.7 mg/dL (ref 8.7–10.3)
Chloride: 102 mmol/L (ref 96–106)
Creatinine, Ser: 0.76 mg/dL (ref 0.57–1.00)
GFR calc Af Amer: 97 mL/min/{1.73_m2} (ref >59)
GFR calc non Af Amer: 84 mL/min/{1.73_m2} (ref >59)
Globulin, Total: 2.7 g/dL (ref 1.5–4.5)
Glucose: 97 mg/dL (ref 65–99)
Potassium: 4.3 mmol/L (ref 3.5–5.2)
Sodium: 138 mmol/L (ref 134–144)
Total Protein: 7 g/dL (ref 6.0–8.5)

## 2019-10-13 LAB — TSH: TSH: 2.51 u[IU]/mL (ref 0.450–4.500)

## 2019-10-13 NOTE — Telephone Encounter (Signed)
LVM about labs in normal range but Lipids were higher and to try to diet and exercise to help lower them.

## 2019-10-18 ENCOUNTER — Other Ambulatory Visit: Payer: Self-pay | Admitting: Unknown Physician Specialty

## 2019-10-18 ENCOUNTER — Other Ambulatory Visit: Payer: Self-pay

## 2019-10-18 ENCOUNTER — Ambulatory Visit
Admission: RE | Admit: 2019-10-18 | Discharge: 2019-10-18 | Disposition: A | Discharge status: Home / Self Care | Payer: PPO | Source: Ambulatory Visit | Attending: Unknown Physician Specialty | Admitting: Unknown Physician Specialty

## 2019-10-18 DIAGNOSIS — R053 Chronic cough: Secondary | ICD-10-CM

## 2019-10-18 DIAGNOSIS — J45991 Cough variant asthma: Secondary | ICD-10-CM | POA: Diagnosis not present

## 2019-10-18 DIAGNOSIS — R05 Cough: Secondary | ICD-10-CM

## 2019-10-18 DIAGNOSIS — K219 Gastro-esophageal reflux disease without esophagitis: Secondary | ICD-10-CM | POA: Diagnosis not present

## 2019-10-18 DIAGNOSIS — J324 Chronic pansinusitis: Secondary | ICD-10-CM | POA: Diagnosis not present

## 2019-10-28 ENCOUNTER — Other Ambulatory Visit: Payer: Self-pay | Admitting: Obstetrics & Gynecology

## 2019-10-28 DIAGNOSIS — Z1231 Encounter for screening mammogram for malignant neoplasm of breast: Secondary | ICD-10-CM

## 2019-10-29 ENCOUNTER — Other Ambulatory Visit: Payer: Self-pay

## 2019-10-29 ENCOUNTER — Ambulatory Visit (INDEPENDENT_AMBULATORY_CARE_PROVIDER_SITE_OTHER): Payer: PPO | Admitting: Pulmonary Disease

## 2019-10-29 ENCOUNTER — Encounter: Payer: Self-pay | Admitting: Pulmonary Disease

## 2019-10-29 ENCOUNTER — Other Ambulatory Visit
Admission: RE | Admit: 2019-10-29 | Discharge: 2019-10-29 | Disposition: A | Discharge status: Home / Self Care | Payer: PPO | Attending: Pulmonary Disease | Admitting: Pulmonary Disease

## 2019-10-29 VITALS — BP 128/76 | HR 100 | Temp 96.8°F | Ht 59.5 in | Wt 143.0 lb

## 2019-10-29 DIAGNOSIS — J45991 Cough variant asthma: Secondary | ICD-10-CM | POA: Insufficient documentation

## 2019-10-29 DIAGNOSIS — J40 Bronchitis, not specified as acute or chronic: Secondary | ICD-10-CM

## 2019-10-29 DIAGNOSIS — D7218 Eosinophilia in diseases classified elsewhere: Secondary | ICD-10-CM | POA: Insufficient documentation

## 2019-10-29 NOTE — Progress Notes (Signed)
Subjective:    Patient ID: [name] Brown, female    DOB: 29-Jan-1956, 64 y.o.   MRN: [id number]  HPI [name] Brown is 64 year old lifelong never smoker who presents for evaluation of a cough that developed approximately a month ago.  The patient is kindly referred by Dr. Beverly Gust.  Patient states that cough initially was productive producing yellowish to greenish sputum.  Now this has cleared.  The cough is worse in the mornings and in the evenings.  He has not had any fevers, chills or sweats.  She saw Dr. Tami Ribas as he follows her for chronic pansinusitis.  She states that he evaluated her and her sinuses look clear.  He felt that the cough was related to pulmonary issues.  She was prescribed Flovent 110 mcg 2 puffs twice a day.  She has only been using it once a day but feels that this has helped her.  She has a longstanding issues with sinus disease she is status post sinus surgery x3 in [facility].  It was in the 1980s.  She also has had issues with food allergies and environmental allergies mostly ragweed.  Prior to her surgery she was noted to have issues with nasal polyposis and chronic nasal obstruction.   She has not had any weight loss or anorexia.  No orthopnea or paroxysmal nocturnal dyspnea, no lower extremity edema.  She does not have shortness of breath per se.  She does noted some "wheezing" particularly when she lays down at night.She does have issues with gastroesophageal reflux for which she takes Prilosec.  This has been controlled lately.   Reports from the pathology specimens from her sinus surgeries are not available.  It would have been helpful to see if there has significant eosinophilia.  Reviewing her laboratory data she has had eosinophilia noted particularly on a CBC performed 2 weeks ago.  Chest x-ray performed on 18 October 2019 was clear.  I have independently reviewed this film.  Past medical history, surgical history and family history have been  reviewed.  They are as noted. There is a history of asthma in the family.  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  She has resided in [place] previously for 14 years[place]  She has been in [place] otherwise.    Review of Systems A 10 point review of systems was performed and it is as noted above otherwise negative.    Objective:   Physical Exam BP 128/76 (BP Location: Left Arm, Cuff Size: Normal)   Pulse 100   Temp (!) 96.8 F (36 C)   Ht 4' 11.5" (1.511 m)   Wt 143 lb (64.9 kg)   LMP 01/28/1997   SpO2 99%   BMI 28.40 kg/m  GENERAL: Awake and alert, no focal deficit. HEAD: Normocephalic, atraumatic.  EYES: Pupils equal, round, reactive to light. No scleral icterus.  MOUTH: Nose/mouth/throat not examined due to masking requirements for COVID 19. NECK: Supple. No thyromegaly. No nodules. No JVD. Trachea midline. PULMONARY: Good air entry bilaterally. Wheezes noted of the upper lung zones. CARDIOVASCULAR: S1 and S2. Regular rate and rhythm.  GASTROINTESTINAL: Nondistended. MUSCULOSKELETAL: No joint swelling, no clubbing, nor edema.  NEUROLOGIC: No focal deficits.  Awake and alert, fluent speech. SKIN: Intact,warm,dry.  No petechiae or rashes on limited exam. PSYCH: Mood and behavior appropriate.     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  We will get released when  they become available again.  Renold Don, MD [facility]   *This note was dictated using voice recognition software/Dragon.  Despite best efforts to proofread, errors can occur which can change the meaning.  Any change was purely unintentional.

## 2019-10-29 NOTE — Patient Instructions (Signed)
We will check IgE levels and RAST study this will give [place] an idea of potential no allergies that may have developed  Continue Flovent do use it  2 puffs twice a day with rinsing mouth well after use  We will see you in follow-up in 2 months time call sooner should any new difficulties arise

## 2019-10-31 LAB — ALLERGENS W/TOTAL IGE AREA 2
Alternaria Alternata IgE: <0.1 kU/L
Aspergillus Fumigatus IgE: <0.1 kU/L
Bermuda Grass IgE: 0.13 kU/L — AB
Cat Dander IgE: <0.1 kU/L
Cedar, Mountain IgE: 0.11 kU/L — AB
Cladosporium Herbarum IgE: <0.1 kU/L
Cockroach, German IgE: <0.1 kU/L
Common Silver Birch IgE: 0.13 kU/L — AB
Cottonwood IgE: 0.13 kU/L — AB
D Farinae IgE: <0.1 kU/L
D Pteronyssinus IgE: <0.1 kU/L
Dog Dander IgE: <0.1 kU/L
Elm, American IgE: 0.23 kU/L — AB
IgE (Immunoglobulin E), Serum: 123 IU/mL (ref 6–495)
Johnson Grass IgE: 0.14 kU/L — AB
Maple/Box Elder IgE: 0.14 kU/L — AB
Mouse Urine IgE: <0.1 kU/L
Oak, White IgE: 0.19 kU/L — AB
Pecan, Hickory IgE: <0.1 kU/L
Penicillium Chrysogen IgE: <0.1 kU/L
Pigweed, Rough IgE: <0.1 kU/L
Ragweed, Short IgE: <0.1 kU/L
Sheep Sorrel IgE Qn: 0.18 kU/L — AB
Timothy Grass IgE: 0.21 kU/L — AB
White Mulberry IgE: <0.1 kU/L

## 2019-11-18 ENCOUNTER — Other Ambulatory Visit: Payer: Self-pay | Admitting: Family Medicine

## 2019-11-18 DIAGNOSIS — M25519 Pain in unspecified shoulder: Secondary | ICD-10-CM

## 2019-11-18 DIAGNOSIS — F329 Major depressive disorder, single episode, unspecified: Secondary | ICD-10-CM

## 2019-11-18 DIAGNOSIS — M797 Fibromyalgia: Secondary | ICD-10-CM

## 2019-11-18 DIAGNOSIS — R52 Pain, unspecified: Secondary | ICD-10-CM

## 2019-11-18 DIAGNOSIS — M542 Cervicalgia: Secondary | ICD-10-CM

## 2019-11-18 DIAGNOSIS — N393 Stress incontinence (female) (male): Secondary | ICD-10-CM

## 2019-11-18 DIAGNOSIS — F32A Depression, unspecified: Secondary | ICD-10-CM

## 2019-12-07 ENCOUNTER — Other Ambulatory Visit: Payer: Self-pay

## 2019-12-07 ENCOUNTER — Ambulatory Visit
Admission: RE | Admit: 2019-12-07 | Discharge: 2019-12-07 | Disposition: A | Discharge status: Home / Self Care | Payer: PPO | Source: Ambulatory Visit | Attending: Obstetrics & Gynecology | Admitting: Obstetrics & Gynecology

## 2019-12-07 DIAGNOSIS — Z1231 Encounter for screening mammogram for malignant neoplasm of breast: Secondary | ICD-10-CM

## 2019-12-24 ENCOUNTER — Other Ambulatory Visit: Payer: Self-pay | Admitting: Family Medicine

## 2019-12-24 DIAGNOSIS — E7849 Other hyperlipidemia: Secondary | ICD-10-CM

## 2020-01-17 ENCOUNTER — Other Ambulatory Visit: Payer: Self-pay | Admitting: Family Medicine

## 2020-01-17 DIAGNOSIS — E039 Hypothyroidism, unspecified: Secondary | ICD-10-CM

## 2020-01-17 DIAGNOSIS — H02886 Meibomian gland dysfunction of left eye, unspecified eyelid: Secondary | ICD-10-CM | POA: Diagnosis not present

## 2020-01-17 DIAGNOSIS — H04129 Dry eye syndrome of unspecified lacrimal gland: Secondary | ICD-10-CM | POA: Diagnosis not present

## 2020-01-17 DIAGNOSIS — H02883 Meibomian gland dysfunction of right eye, unspecified eyelid: Secondary | ICD-10-CM | POA: Diagnosis not present

## 2020-01-17 DIAGNOSIS — H02885 Meibomian gland dysfunction left lower eyelid: Secondary | ICD-10-CM | POA: Diagnosis not present

## 2020-01-24 ENCOUNTER — Encounter: Payer: Self-pay | Admitting: Pulmonary Disease

## 2020-01-24 ENCOUNTER — Other Ambulatory Visit: Payer: Self-pay

## 2020-01-24 ENCOUNTER — Ambulatory Visit: Payer: PPO | Admitting: Pulmonary Disease

## 2020-01-24 VITALS — BP 112/68 | HR 88 | Ht 59.5 in | Wt 146.2 lb

## 2020-01-24 DIAGNOSIS — J40 Bronchitis, not specified as acute or chronic: Secondary | ICD-10-CM

## 2020-01-24 DIAGNOSIS — D7218 Eosinophilia in diseases classified elsewhere: Secondary | ICD-10-CM | POA: Diagnosis not present

## 2020-01-24 DIAGNOSIS — J45991 Cough variant asthma: Secondary | ICD-10-CM

## 2020-01-24 NOTE — Patient Instructions (Signed)
Continue Flovent at 2 puffs twice a day for 2 more weeks then decrease to 1 puff twice a day  You should have evaluation for allergies to grasses and trees, may need allergy shots  We will see her in follow-up in 3 months time, call sooner should any new problems arise.

## 2020-02-14 ENCOUNTER — Other Ambulatory Visit: Payer: Self-pay | Admitting: Family Medicine

## 2020-02-14 DIAGNOSIS — F32A Depression, unspecified: Secondary | ICD-10-CM

## 2020-02-14 DIAGNOSIS — N393 Stress incontinence (female) (male): Secondary | ICD-10-CM

## 2020-02-14 DIAGNOSIS — M542 Cervicalgia: Secondary | ICD-10-CM

## 2020-02-14 DIAGNOSIS — M797 Fibromyalgia: Secondary | ICD-10-CM

## 2020-02-14 DIAGNOSIS — R52 Pain, unspecified: Secondary | ICD-10-CM

## 2020-02-14 DIAGNOSIS — M25519 Pain in unspecified shoulder: Secondary | ICD-10-CM

## 2020-02-14 NOTE — Telephone Encounter (Signed)
Approved per protocol.  

## 2020-02-16 DIAGNOSIS — Z85828 Personal history of other malignant neoplasm of skin: Secondary | ICD-10-CM | POA: Diagnosis not present

## 2020-02-16 DIAGNOSIS — Z08 Encounter for follow-up examination after completed treatment for malignant neoplasm: Secondary | ICD-10-CM | POA: Diagnosis not present

## 2020-02-16 DIAGNOSIS — L718 Other rosacea: Secondary | ICD-10-CM | POA: Diagnosis not present

## 2020-02-22 ENCOUNTER — Other Ambulatory Visit: Payer: Self-pay

## 2020-02-22 ENCOUNTER — Encounter: Payer: Self-pay | Admitting: Family Medicine

## 2020-02-22 ENCOUNTER — Ambulatory Visit (INDEPENDENT_AMBULATORY_CARE_PROVIDER_SITE_OTHER): Payer: PPO | Admitting: Family Medicine

## 2020-02-22 VITALS — BP 103/71 | HR 74 | Temp 97.1°F | Resp 16 | Ht 60.0 in | Wt 147.0 lb

## 2020-02-22 DIAGNOSIS — E663 Overweight: Secondary | ICD-10-CM

## 2020-02-22 DIAGNOSIS — R222 Localized swelling, mass and lump, trunk: Secondary | ICD-10-CM

## 2020-02-22 DIAGNOSIS — M797 Fibromyalgia: Secondary | ICD-10-CM | POA: Diagnosis not present

## 2020-02-22 DIAGNOSIS — F325 Major depressive disorder, single episode, in full remission: Secondary | ICD-10-CM

## 2020-02-22 NOTE — Progress Notes (Signed)
Established patient visit  I,[name] Brown,acting as a scribe for [name] Durie, MD.,have documented all relevant documentation on the behalf of [name] Durie, MD,as directed by  [name] Durie, MD while in the presence of [name] Durie, MD.   Patient: [name] Brown   DOB: Nov 18, 1955   64 y.o. Female  MRN: [id number] Visit Date: 02/22/2020  Today's healthcare provider: Wilhemena Durie, MD   Chief Complaint  Patient presents with  . Mass    Shoulder   Subjective    HPI Patient is here concerning a lump on her right shoulder at the base of her neck. Patient states she noticed the lump about one year ago. Lump hs grown slightly since she first noticed it. Patient states there is no pain in and around the area of the lump however she states she is aware of it. Her muscles feel tight in that area. Patient had her dermatology and they advised that patient's primary look it the lump. She feels well and has no complaints.  No chest pain, shortness of breath, diaphoresis or weight loss or sweats. Past Medical History:  Diagnosis Date  . Anxiety   . Cervical spondylosis without myelopathy   . Cervicalgia 01/11/2014  . Degeneration of cervical intervertebral disc   . Endometriosis   . Headache(784.0)   . High cholesterol   . History of fusion of cervical spine 01/11/2014  . IC (interstitial cystitis)   . Myalgia and myositis, unspecified   . Osteoarthrosis, unspecified whether generalized or localized, unspecified site   . Other and unspecified hyperlipidemia   . Pain in joint, shoulder region 01/11/2014  . Pain in joint, upper arm   . Squamous cell carcinoma    [facility]- place on chest  . Unspecified disorder of bladder        Medications: Outpatient Medications Prior to Visit  Medication Sig  . amitriptyline (ELAVIL) 25 MG tablet TAKE 1 TABLET BY MOUTH AT BEDTIME  . cyclobenzaprine (FLEXERIL) 10 MG tablet TAKE 1 TABLET BY MOUTH AT BEDTIME    . DULoxetine (CYMBALTA) 60 MG capsule Take 1 capsule by mouth once daily  . EUTHYROX 25 MCG tablet TAKE 1 TABLET BY MOUTH ONCE DAILY BEFORE BREAKFAST  . ezetimibe (ZETIA) 10 MG tablet Take 1 tablet by mouth once daily  . FLOVENT HFA 110 MCG/ACT inhaler Inhale 2 puffs into the lungs 2 (two) times daily.   . fluticasone (FLONASE) 50 MCG/ACT nasal spray Place into both nostrils daily.  . Magnesium 500 MG CAPS Take 1 capsule by mouth at bedtime.  . Multiple Vitamin (MULTI-VITAMIN) tablet Take 1 tablet by mouth daily.  Marland Kitchen omeprazole (PRILOSEC) 40 MG capsule Take 40 mg by mouth daily.  . polyethylene glycol (MIRALAX / GLYCOLAX) packet Take 17 g by mouth daily.  . psyllium (REGULOID) 0.52 g capsule Take 0.52 g by mouth daily.   No facility-administered medications prior to visit.    Review of Systems  Constitutional: Negative for appetite change, chills, fatigue and fever.  HENT: Negative.   Eyes: Negative.   Respiratory: Negative for chest tightness and shortness of breath.   Cardiovascular: Negative for chest pain and palpitations.  Gastrointestinal: Negative for abdominal pain, nausea and vomiting.  Endocrine: Negative.   Allergic/Immunologic: Negative.   Neurological: Negative for dizziness and weakness.  Hematological: Negative.   Psychiatric/Behavioral: Negative.        Objective    BP 103/71 (BP Location: Right Arm, Patient Position: Sitting, Cuff Size:  Large)   Pulse 74   Temp (!) 97.1 F (36.2 C) (Other (Comment))   Resp 16   Ht 5' (1.524 m)   Wt 147 lb (66.7 kg)   LMP 01/28/1997   SpO2 97%   BMI 28.71 kg/m  Wt Readings from Last 3 Encounters:  02/22/20 147 lb (66.7 kg)  01/24/20 146 lb 3.2 oz (66.3 kg)  10/29/19 143 lb (64.9 kg)      Physical Exam Vitals and nursing note reviewed.  Constitutional:      Appearance: Normal appearance. She is normal weight.  HENT:     Right Ear: External ear normal.     Left Ear: External ear normal.  Neck:     Comments:  There is also no axillary adenopathy.  No supraclavicular adenopathy Cardiovascular:     Rate and Rhythm: Normal rate and regular rhythm.     Pulses: Normal pulses.     Heart sounds: Normal heart sounds.  Pulmonary:     Effort: Pulmonary effort is normal.     Breath sounds: Normal breath sounds.  Musculoskeletal:        General: Normal range of motion.     Cervical back: Normal range of motion and neck supple. No tenderness.  Lymphadenopathy:     Cervical: No cervical adenopathy.  Skin:    General: Skin is warm and dry.  Neurological:     Mental Status: She is alert.  Psychiatric:        Mood and Affect: Mood normal.        Behavior: Behavior normal.        Thought Content: Thought content normal.        Judgment: Judgment normal.       No results found for any visits on 02/22/20.  Assessment & Plan      1. Supraclavicular mass I think this is a lipoma/fat pad on either side.  There is possibly a small sebaceous cyst also on the right.  2. Depression, major, in remission ([place]) Doing well.  No changes.  3. Fibromyalgia Clinically stable  4. Overweight Patient doing well and working on habits.   Return in 4 months (on 06/29/2020) for CPE.      I, [name] Durie, MD, have reviewed all documentation for this visit. The documentation on 02/22/20 for the exam, diagnosis, procedures, and orders are all accurate and complete.    [name] Brown Mon, MD  [facility] [phone] (phone) [phone] (fax)  [place]

## 2020-03-07 ENCOUNTER — Telehealth: Payer: Self-pay | Admitting: Family Medicine

## 2020-03-07 NOTE — Telephone Encounter (Signed)
Spoke with patient, she stated that she do not have time for visit she is taking care of parents and other family members.

## 2020-03-15 ENCOUNTER — Other Ambulatory Visit: Payer: Self-pay | Admitting: Family Medicine

## 2020-03-15 DIAGNOSIS — E039 Hypothyroidism, unspecified: Secondary | ICD-10-CM

## 2020-03-15 NOTE — Telephone Encounter (Signed)
[facility] faxed refill request for the following medications:  EUTHYROX 25 MCG tablet   Last Rx: 01/17/2020 LOV: 02/22/2020 Please advise. Thanks TNP

## 2020-03-16 MED ORDER — LEVOTHYROXINE SODIUM 25 MCG PO TABS: ORAL_TABLET | ORAL | 3 refills | Status: DC

## 2020-03-16 NOTE — Telephone Encounter (Signed)
Refill sent to pharmacy.   

## 2020-03-17 ENCOUNTER — Other Ambulatory Visit: Payer: Self-pay | Admitting: Family Medicine

## 2020-03-17 DIAGNOSIS — E039 Hypothyroidism, unspecified: Secondary | ICD-10-CM

## 2020-03-23 ENCOUNTER — Other Ambulatory Visit: Payer: Self-pay | Admitting: Family Medicine

## 2020-03-23 NOTE — Telephone Encounter (Signed)
Requested  medications are  due for refill today yes  Requested medications are on the active medication list yes  Last refill 5/26  Notes to clinic Not  delegated

## 2020-04-05 ENCOUNTER — Encounter: Payer: Self-pay | Admitting: Family Medicine

## 2020-05-16 ENCOUNTER — Other Ambulatory Visit: Payer: Self-pay | Admitting: Family Medicine

## 2020-05-16 DIAGNOSIS — M797 Fibromyalgia: Secondary | ICD-10-CM

## 2020-05-16 DIAGNOSIS — F32A Depression, unspecified: Secondary | ICD-10-CM

## 2020-05-16 DIAGNOSIS — N393 Stress incontinence (female) (male): Secondary | ICD-10-CM

## 2020-05-16 DIAGNOSIS — M542 Cervicalgia: Secondary | ICD-10-CM

## 2020-05-16 DIAGNOSIS — F329 Major depressive disorder, single episode, unspecified: Secondary | ICD-10-CM

## 2020-05-16 DIAGNOSIS — R52 Pain, unspecified: Secondary | ICD-10-CM

## 2020-05-16 DIAGNOSIS — M25519 Pain in unspecified shoulder: Secondary | ICD-10-CM

## 2020-05-16 NOTE — Telephone Encounter (Signed)
Requested Prescriptions  Pending Prescriptions Disp Refills   DULoxetine (CYMBALTA) 60 MG capsule [Pharmacy Med Name: DULoxetine HCl 60 MG Oral Capsule Delayed Release Particles] 90 capsule 0    Sig: Take 1 capsule by mouth once daily     Psychiatry: Antidepressants - SNRI Passed - 05/16/2020  9:12 PM      Passed - Completed PHQ-2 or PHQ-9 in the last 360 days.      Passed - Last BP in normal range    BP Readings from Last 1 Encounters:  02/22/20 103/71         Passed - Valid encounter within last 6 months    Recent Outpatient Visits          2 months ago Supraclavicular mass   [facility] Jerrol Banana., MD   7 months ago [place] Jerrol Banana., MD   1 year ago DDD (degenerative disc disease), cervical   [facility] Jerrol Banana., MD   1 year ago Chronic interstitial cystitis   [facility] Jerrol Banana., MD   2 years ago Chronic low back pain with sciatica, sciatica laterality unspecified, unspecified back pain laterality   [facility] Jerrol Banana., MD      Future Appointments            In 1 month Jerrol Banana., MD [facility], [place]

## 2020-06-12 DIAGNOSIS — I83893 Varicose veins of bilateral lower extremities with other complications: Secondary | ICD-10-CM | POA: Diagnosis not present

## 2020-06-12 DIAGNOSIS — I8312 Varicose veins of left lower extremity with inflammation: Secondary | ICD-10-CM | POA: Diagnosis not present

## 2020-06-12 DIAGNOSIS — I8311 Varicose veins of right lower extremity with inflammation: Secondary | ICD-10-CM | POA: Diagnosis not present

## 2020-06-12 DIAGNOSIS — I83813 Varicose veins of bilateral lower extremities with pain: Secondary | ICD-10-CM | POA: Diagnosis not present

## 2020-06-23 NOTE — Progress Notes (Signed)
64 y.o. [id number] Married White or Caucasian female here for annual exam.  She's frustrated with herself and her weight gain.  They have lots of stressors.  Step father has dementia and she's been doing a lot for her mother.  She is helping to take care of her grandchildren.  Daughter in Sports coach in a Pharmacist, hospital.  Pt helped teach her four grandchildren.    She had sciatica issues last year.  Did PT.  This really helped.  She's having some issues again.  Has the exercises to do but just isn't.    Denies vaginal bleeding.    PCP:  Dr. Rosanna Randy.  Last visit was 10/05/2019.  Had blood work then.    Patient's last menstrual period was 01/28/1997.          Sexually active: No.  The current method of family planning is status post hysterectomy.    Exercising: No.  exercise Smoker:  no  Health Maintenance: Pap:  10-14-2016 neg History of abnormal Pap:  yes MMG:  12-07-2019 category c density birads 1:neg Colonoscopy:  08-12-17 f/u 50yrs BMD:   11-23-2018 osteopenia TDaP:  2018 Pneumonia vaccine(s):  Not done Shingrix:   done Hep C testing: neg 2018 Screening Labs: Done in January with Dr. Rosanna Randy   reports that she has never smoked. She has never used smokeless tobacco. She reports current alcohol use. She reports that she does not use drugs.  Past Medical History:  Diagnosis Date  . Anxiety   . Cervical spondylosis without myelopathy   . Cervicalgia 01/11/2014  . Degeneration of cervical intervertebral disc   . Endometriosis   . Endometriosis   . Eosinophilic asthma   . Fibromyalgia   . Headache(784.0)   . High cholesterol   . History of fusion of cervical spine 01/11/2014  . IC (interstitial cystitis)   . Myalgia and myositis, unspecified   . Osteoarthrosis, unspecified whether generalized or localized, unspecified site   . Osteopenia   . Other and unspecified hyperlipidemia   . Pain in joint, shoulder region 01/11/2014  . Pain in joint, upper arm   . Squamous cell carcinoma    [facility]- place on chest  . Unspecified disorder of bladder     Past Surgical History:  Procedure Laterality Date  . BLADDER SUSPENSION  07/2010   With cystocele repair, Dr. Amalia Hailey  . ELBOW SURGERY Right 08/2011  . HYSTERECTOMY ABDOMINAL WITH SALPINGO-OOPHORECTOMY  01/1996  . SPINAL FUSION  10/2010   C5-C6, C6-C7, Dr. Ellene Route  . TONSILLECTOMY  1969    Current Outpatient Medications  Medication Sig Dispense Refill  . amitriptyline (ELAVIL) 25 MG tablet TAKE 1 TABLET BY MOUTH AT BEDTIME 90 tablet 3  . cyclobenzaprine (FLEXERIL) 10 MG tablet TAKE 1 TABLET BY MOUTH AT BEDTIME 30 tablet 3  . DULoxetine (CYMBALTA) 60 MG capsule Take 1 capsule by mouth once daily 90 capsule 1  . ezetimibe (ZETIA) 10 MG tablet Take 1 tablet by mouth once daily 90 tablet 2  . FLOVENT HFA 110 MCG/ACT inhaler Inhale 2 puffs into the lungs 2 (two) times daily.     . fluticasone (FLONASE) 50 MCG/ACT nasal spray Place into both nostrils daily.    Marland Kitchen levothyroxine (EUTHYROX) 25 MCG tablet TAKE 1 TABLET BY MOUTH ONCE DAILY BEFORE BREAKFAST 30 tablet 3  . Magnesium 500 MG CAPS Take 1 capsule by mouth at bedtime.    . Multiple Vitamin (MULTI-VITAMIN) tablet Take 1 tablet by mouth daily.    Marland Kitchen omeprazole (  PRILOSEC) 40 MG capsule Take 40 mg by mouth daily.    . polyethylene glycol (MIRALAX / GLYCOLAX) packet Take 17 g by mouth daily.    . psyllium (REGULOID) 0.52 g capsule Take 0.52 g by mouth daily.    Marland Kitchen UNABLE TO FIND Metronidazole 1% + Ivermectin 1% Apply to face nightly     No current facility-administered medications for this visit.    Family History  Problem Relation Age of Onset  . Osteoarthritis Mother   . Other Mother        has nipple sparing mastectomies due to "abnormal breast finding"  . Heart disease Father   . Osteoarthritis Maternal Grandfather        Rheumatoid  . Cancer Paternal Grandmother        tongue & throat  . Breast cancer Maternal Aunt 68  . Melanoma Maternal Aunt 69  . Breast  cancer Maternal Aunt   . Uterine cancer Cousin     Review of Systems  Constitutional: Negative.   HENT: Negative.   Eyes: Negative.   Respiratory: Negative.   Cardiovascular: Negative.   Gastrointestinal: Negative.   Endocrine: Negative.   Genitourinary: Negative.   Musculoskeletal: Negative.   Skin: Negative.   Allergic/Immunologic: Negative.   Neurological: Negative.   Hematological: Negative.   Psychiatric/Behavioral: Negative.     Exam:   BP 110/70   Pulse 70   Resp 16   Ht 4' 11.25" (1.505 m)   Wt 148 lb (67.1 kg)   LMP 01/28/1997   BMI 29.64 kg/m   Height: 4' 11.25" (150.5 cm)  General appearance: alert, cooperative and appears stated age Head: Normocephalic, without obvious abnormality, atraumatic Neck: no adenopathy, supple, symmetrical, trachea midline and thyroid normal to inspection and palpation Lungs: clear to auscultation bilaterally Breasts: normal appearance, no masses or tenderness Heart: regular rate and rhythm Abdomen: soft, non-tender; bowel sounds normal; no masses,  no organomegaly Extremities: extremities normal, atraumatic, no cyanosis or edema Skin: Skin color, texture, turgor normal. No rashes or lesions Lymph nodes: Cervical, supraclavicular, and axillary nodes normal. No abnormal inguinal nodes palpated Neurologic: Grossly normal   Pelvic: External genitalia:  no lesions              Urethra:  normal appearing urethra with no masses, tenderness or lesions              Bartholins and Skenes: normal                 Vagina: normal appearing vagina with normal color and discharge, no lesions              Cervix: absent              Pap taken: No. Bimanual Exam:  Uterus:  uterus absent              Adnexa: no mass, fullness, tenderness               Rectovaginal: Confirms               Anus:  normal sphincter tone, no lesions  Chaperone, Terence Lux, CMA, was present for exam.  A:  Well Woman with normal exam PMP, no  HRT Fibromyalgia H/o I.C. Hypothyroidism Elevated lipids H/o TAH/BSO Cystocele repair and sling placement 11/11 Osteopenia  P:   Mammogram guidelines reviewed.  Doing 3D MMG pap smear not indicated Colonoscopy 11/18 Lab work done 10/2019 with Dr. Rosanna Randy Vaccines discussed including Covid update and pneumonia  vaccination BMD done 2020 Pt is going to return in 2 years or sooner with any new symptom or concern.

## 2020-06-27 ENCOUNTER — Encounter: Payer: Self-pay | Admitting: Obstetrics & Gynecology

## 2020-06-27 ENCOUNTER — Other Ambulatory Visit: Payer: Self-pay

## 2020-06-27 ENCOUNTER — Ambulatory Visit (INDEPENDENT_AMBULATORY_CARE_PROVIDER_SITE_OTHER): Payer: PPO | Admitting: Obstetrics & Gynecology

## 2020-06-27 VITALS — BP 110/70 | HR 70 | Resp 16 | Ht 59.25 in | Wt 148.0 lb

## 2020-06-27 DIAGNOSIS — I83893 Varicose veins of bilateral lower extremities with other complications: Secondary | ICD-10-CM | POA: Diagnosis not present

## 2020-06-27 DIAGNOSIS — I83813 Varicose veins of bilateral lower extremities with pain: Secondary | ICD-10-CM | POA: Diagnosis not present

## 2020-06-27 DIAGNOSIS — Z01419 Encounter for gynecological examination (general) (routine) without abnormal findings: Secondary | ICD-10-CM | POA: Diagnosis not present

## 2020-06-27 DIAGNOSIS — I8311 Varicose veins of right lower extremity with inflammation: Secondary | ICD-10-CM | POA: Diagnosis not present

## 2020-06-27 DIAGNOSIS — I8312 Varicose veins of left lower extremity with inflammation: Secondary | ICD-10-CM | POA: Diagnosis not present

## 2020-06-29 ENCOUNTER — Other Ambulatory Visit: Payer: Self-pay

## 2020-06-29 ENCOUNTER — Encounter: Payer: Self-pay | Admitting: Family Medicine

## 2020-06-29 ENCOUNTER — Ambulatory Visit (INDEPENDENT_AMBULATORY_CARE_PROVIDER_SITE_OTHER): Payer: PPO | Admitting: Family Medicine

## 2020-06-29 VITALS — BP 100/67 | HR 98 | Temp 98.1°F | Ht 60.0 in | Wt 148.4 lb

## 2020-06-29 DIAGNOSIS — E7849 Other hyperlipidemia: Secondary | ICD-10-CM

## 2020-06-29 DIAGNOSIS — Z981 Arthrodesis status: Secondary | ICD-10-CM | POA: Diagnosis not present

## 2020-06-29 DIAGNOSIS — D179 Benign lipomatous neoplasm, unspecified: Secondary | ICD-10-CM | POA: Diagnosis not present

## 2020-06-29 DIAGNOSIS — M797 Fibromyalgia: Secondary | ICD-10-CM

## 2020-06-29 DIAGNOSIS — F325 Major depressive disorder, single episode, in full remission: Secondary | ICD-10-CM | POA: Diagnosis not present

## 2020-06-29 DIAGNOSIS — E039 Hypothyroidism, unspecified: Secondary | ICD-10-CM | POA: Diagnosis not present

## 2020-06-29 MED ORDER — LEVOTHYROXINE SODIUM 25 MCG PO TABS: ORAL_TABLET | ORAL | 3 refills | Status: DC

## 2020-06-29 NOTE — Progress Notes (Signed)
Established patient visit   Patient: [name] Brown   DOB: [birth year]   64 y.o. Female  MRN: [id number] Visit Date: 06/29/2020  Today's healthcare provider: Wilhemena Durie, MD   Chief Complaint  Patient presents with  . Depression  . Hyperlipidemia  . Hypothyroidism   Subjective    HPI  Patient states she had her well woman exam last week at GYN. Overall feels she feels well but is very busy and tired as she has a mother and stepfather in failing health who are still living independently and she looks after her grandchildren frequently.  Her fibromyalgia is stable. She has been on a stable dose of Synthroid for many years Lipid/Cholesterol, Follow-up  Last lipid panel Other pertinent labs  Lab Results  Component Value Date   CHOL 235 (H) 10/12/2019   HDL 71 10/12/2019   LDLCALC 149 (H) 10/12/2019   TRIG 86 10/12/2019   CHOLHDL 3.1 02/19/2018   Lab Results  Component Value Date   ALT 46 (H) 10/12/2019   AST 30 10/12/2019   PLT 307 10/12/2019   TSH 2.510 10/12/2019     She was last seen for this 8 months ago.  Management since that visit includes no changes.  She reports excellent compliance with treatment. She is not having side effects.   Current diet: in general, a "healthy" diet   Current exercise: none  The 10-year ASCVD risk score Mikey Bussing DC Jr., et al., 2013) is: 3%  --------------------------------------------------------------------------------------------------- Hypothyroid, follow-up  Lab Results  Component Value Date   TSH 2.510 10/12/2019   TSH 2.120 02/19/2018   TSH 2.680 12/11/2016   FREET4 0.88 11/28/2010   T4TOTAL 3.6 (L) 11/27/2010   Wt Readings from Last 3 Encounters:  06/29/20 148 lb 6.4 oz (67.3 kg)  06/27/20 148 lb (67.1 kg)  02/22/20 147 lb (66.7 kg)    She was last seen for hypothyroid 8 months ago.  Management since that visit includes no changes. She reports excellent compliance with treatment. She is not having  side effects.   Symptoms: No change in energy level No constipation  No diarrhea No heat / cold intolerance  No nervousness No palpitations  No weight changes    -----------------------------------------------------------------------------------------   Social History   Tobacco Use  . Smoking status: Never Smoker  . Smokeless tobacco: Never Used  Vaping Use  . Vaping Use: Never used  Substance Use Topics  . Alcohol use: Yes    Alcohol/week: 0.0 - 1.0 standard drinks  . Drug use: No       Medications: Outpatient Medications Prior to Visit  Medication Sig  . amitriptyline (ELAVIL) 25 MG tablet TAKE 1 TABLET BY MOUTH AT BEDTIME  . cyclobenzaprine (FLEXERIL) 10 MG tablet TAKE 1 TABLET BY MOUTH AT BEDTIME  . DULoxetine (CYMBALTA) 60 MG capsule Take 1 capsule by mouth once daily  . ezetimibe (ZETIA) 10 MG tablet Take 1 tablet by mouth once daily  . FLOVENT HFA 110 MCG/ACT inhaler Inhale 2 puffs into the lungs 2 (two) times daily.   . fluticasone (FLONASE) 50 MCG/ACT nasal spray Place into both nostrils daily.  Marland Kitchen levothyroxine (EUTHYROX) 25 MCG tablet TAKE 1 TABLET BY MOUTH ONCE DAILY BEFORE BREAKFAST  . Magnesium 500 MG CAPS Take 1 capsule by mouth at bedtime.  . Multiple Vitamin (MULTI-VITAMIN) tablet Take 1 tablet by mouth daily.  Marland Kitchen omeprazole (PRILOSEC) 40 MG capsule Take 40 mg by mouth daily.  . polyethylene glycol (MIRALAX / GLYCOLAX)  packet Take 17 g by mouth daily.  . psyllium (REGULOID) 0.52 g capsule Take 0.52 g by mouth daily.  Marland Kitchen UNABLE TO FIND Metronidazole 1% + Ivermectin 1% Apply to face nightly   No facility-administered medications prior to visit.    Review of Systems     Objective    BP 100/67 (BP Location: Right Arm, Patient Position: Sitting, Cuff Size: Normal)   Pulse 98   Temp 98.1 F (36.7 C) (Oral)   Ht 5' (1.524 m)   Wt 148 lb 6.4 oz (67.3 kg)   LMP 01/28/1997   BMI 28.98 kg/m  BP Readings from Last 3 Encounters:  06/29/20 100/67  06/27/20  110/70  02/22/20 103/71   Wt Readings from Last 3 Encounters:  06/29/20 148 lb 6.4 oz (67.3 kg)  06/27/20 148 lb (67.1 kg)  02/22/20 147 lb (66.7 kg)      Physical Exam Vitals reviewed.  Constitutional:      Appearance: Normal appearance.  HENT:     Head: Normocephalic and atraumatic.     Right Ear: External ear normal.     Left Ear: External ear normal.  Eyes:     General: No scleral icterus.    Conjunctiva/sclera: Conjunctivae normal.  Cardiovascular:     Rate and Rhythm: Normal rate and regular rhythm.     Pulses: Normal pulses.     Heart sounds: Normal heart sounds.  Pulmonary:     Effort: Pulmonary effort is normal.     Breath sounds: Normal breath sounds.  Musculoskeletal:     Right lower leg: No edema.     Left lower leg: No edema.  Skin:    General: Skin is warm and dry.     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  Neurological:     General: No focal deficit present.     Mental Status: She is alert and oriented to person, place, and time.  Psychiatric:        Mood and Affect: Mood normal.        Behavior: Behavior normal.        Thought Content: Thought content normal.        Judgment: Judgment normal.       No results found for any visits on 06/29/20.  Assessment & Plan     1. Depression, major, in remission ([place]) Continue duloxetine indefinitely  2. Other hyperlipidemia Follow-up labs at CPE  3. Hypothyroidism, unspecified type Labs on physical exam in the spring - levothyroxine (EUTHYROX) 25 MCG tablet; TAKE 1 TABLET BY MOUTH ONCE DAILY BEFORE BREAKFAST  Dispense: 30 tablet; Refill: 3  4. Multiple lipomas Has follow-up with dermatology.  5. History of fusion of cervical spine  6.  Fibromyalgia Continue duloxetine as patient is doing well.  Also continue Elavil at bedtime   No follow-ups on file.      I, Wilhemena Durie, MD, have reviewed all documentation for this visit. The documentation on 06/29/20  for the exam, diagnosis, procedures, and orders are all accurate and complete.    [name] Dorsainvil Cranford Mon, MD  [facility] [phone] (phone) [phone] (fax)  [place]

## 2020-07-11 DIAGNOSIS — I8312 Varicose veins of left lower extremity with inflammation: Secondary | ICD-10-CM | POA: Diagnosis not present

## 2020-07-11 DIAGNOSIS — I83812 Varicose veins of left lower extremities with pain: Secondary | ICD-10-CM | POA: Diagnosis not present

## 2020-07-18 ENCOUNTER — Other Ambulatory Visit: Payer: Self-pay | Admitting: Family Medicine

## 2020-07-18 NOTE — Telephone Encounter (Signed)
Requested medication (s) are due for refill today: due 07/23/20  Requested medication (s) are on the active medication list: yes  Last refill: 03/23/20   #30  3 refills  Future visit scheduled yes 01/01/21  Notes to clinic:not delegated  Requested Prescriptions  Pending Prescriptions Disp Refills   cyclobenzaprine (FLEXERIL) 10 MG tablet [Pharmacy Med Name: Cyclobenzaprine HCl 10 MG Oral Tablet] 30 tablet 0    Sig: TAKE 1 TABLET BY MOUTH AT BEDTIME      Not Delegated - Analgesics:  Muscle Relaxants Failed - 07/18/2020 11:10 AM      Failed - This refill cannot be delegated      Passed - Valid encounter within last 6 months    Recent Outpatient Visits           2 weeks ago Depression, major, in remission [facility])   [facility] Jerrol Banana., MD   4 months ago Supraclavicular mass   [facility] Jerrol Banana., MD   9 months ago [place] Jerrol Banana., MD   1 year ago DDD (degenerative disc disease), cervical   [facility] Jerrol Banana., MD   1 year ago Chronic interstitial cystitis   [facility] Jerrol Banana., MD       Future Appointments             In 5 months Jerrol Banana., MD [facility]

## 2020-08-08 DIAGNOSIS — I8312 Varicose veins of left lower extremity with inflammation: Secondary | ICD-10-CM | POA: Diagnosis not present

## 2020-08-08 DIAGNOSIS — I83812 Varicose veins of left lower extremities with pain: Secondary | ICD-10-CM | POA: Diagnosis not present

## 2020-08-11 DIAGNOSIS — I8312 Varicose veins of left lower extremity with inflammation: Secondary | ICD-10-CM | POA: Diagnosis not present

## 2020-08-29 DIAGNOSIS — Z1152 Encounter for screening for COVID-19: Secondary | ICD-10-CM | POA: Diagnosis not present

## 2020-08-29 DIAGNOSIS — Z03818 Encounter for observation for suspected exposure to other biological agents ruled out: Secondary | ICD-10-CM | POA: Diagnosis not present

## 2020-09-08 DIAGNOSIS — I8312 Varicose veins of left lower extremity with inflammation: Secondary | ICD-10-CM | POA: Diagnosis not present

## 2020-09-08 DIAGNOSIS — I83812 Varicose veins of left lower extremities with pain: Secondary | ICD-10-CM | POA: Diagnosis not present

## 2020-09-14 ENCOUNTER — Other Ambulatory Visit: Payer: Self-pay | Admitting: Family Medicine

## 2020-09-14 DIAGNOSIS — E7849 Other hyperlipidemia: Secondary | ICD-10-CM

## 2020-09-25 DIAGNOSIS — I8312 Varicose veins of left lower extremity with inflammation: Secondary | ICD-10-CM | POA: Diagnosis not present

## 2020-11-08 ENCOUNTER — Other Ambulatory Visit: Payer: Self-pay | Admitting: Obstetrics & Gynecology

## 2020-11-08 DIAGNOSIS — Z1231 Encounter for screening mammogram for malignant neoplasm of breast: Secondary | ICD-10-CM

## 2020-11-09 ENCOUNTER — Other Ambulatory Visit: Payer: Self-pay | Admitting: Family Medicine

## 2020-11-09 DIAGNOSIS — M797 Fibromyalgia: Secondary | ICD-10-CM

## 2020-11-09 DIAGNOSIS — M25519 Pain in unspecified shoulder: Secondary | ICD-10-CM

## 2020-11-09 DIAGNOSIS — N393 Stress incontinence (female) (male): Secondary | ICD-10-CM

## 2020-11-09 DIAGNOSIS — M542 Cervicalgia: Secondary | ICD-10-CM

## 2020-11-09 DIAGNOSIS — R52 Pain, unspecified: Secondary | ICD-10-CM

## 2020-11-09 DIAGNOSIS — F32A Depression, unspecified: Secondary | ICD-10-CM

## 2020-11-24 ENCOUNTER — Telehealth: Payer: Self-pay | Admitting: Pulmonary Disease

## 2020-11-24 NOTE — Telephone Encounter (Signed)
Patient is past due for an appointment. appt scheduled for 12/07/2020 at 4:30. Patient stated at her last OV it was mentioned that she could possible try a different inhaler. She feels that flovent is not as effective as it once was. She has enough flovent to last until her appt. She is agreeable with containing flovent and discussing inhaler options at her upcoming appt.  Nothing further needed at this time.

## 2020-12-07 ENCOUNTER — Ambulatory Visit: Payer: PPO | Admitting: Primary Care

## 2020-12-07 ENCOUNTER — Encounter: Payer: Self-pay | Admitting: Primary Care

## 2020-12-07 ENCOUNTER — Other Ambulatory Visit: Payer: Self-pay

## 2020-12-07 VITALS — BP 120/72 | HR 100 | Temp 97.1°F | Ht 59.5 in | Wt 147.0 lb

## 2020-12-07 DIAGNOSIS — J40 Bronchitis, not specified as acute or chronic: Secondary | ICD-10-CM | POA: Diagnosis not present

## 2020-12-07 DIAGNOSIS — J45991 Cough variant asthma: Secondary | ICD-10-CM | POA: Diagnosis not present

## 2020-12-07 DIAGNOSIS — D7218 Eosinophilia in diseases classified elsewhere: Secondary | ICD-10-CM | POA: Diagnosis not present

## 2020-12-07 MED ORDER — MONTELUKAST SODIUM 10 MG PO TABS: 10.0000 mg | ORAL_TABLET | Freq: Every day | ORAL | 5 refills | Status: DC

## 2020-12-07 MED ORDER — ADVAIR HFA 115-21 MCG/ACT IN AERO: 2.0000 | INHALATION_SPRAY | Freq: Two times a day (BID) | RESPIRATORY_TRACT | 5 refills | Status: DC

## 2020-12-07 NOTE — Assessment & Plan Note (Addendum)
-   Upper airway cough and congestion worse. Positive allergy panel; IgE 123, Eos absolute 900. Dr. Patsey Berthold previously recommending ICS/LABA combination maintenance inhaler  - Change Flovent 110mcg to Advair hfa 115-79mcg two puffs BID  - Adding Montelukast 10mg  qhs  - Refer to allergy. FU in 3-6 months

## 2020-12-07 NOTE — Progress Notes (Signed)
@Patient  ID: [name] Brown, female    DOB: [birth year], 65 y.o.   MRN: [id number]  Chief Complaint  Patient presents with  . Follow-up    Patient has been using Flovent inhaler but feels that it is not enough anymore. Patient states that she hears a wheeze, denies cough    Referring provider: Jerrol Banana.,*  HPI: 65 year old female, never smoked.  Past medical history significant for cough variant asthma.  Patient of Dr. Patsey Berthold, last seen in office on 01/24/2020.  Maintained on Flovent 110 mcg twice daily.  12/07/2020 Patient presents today for overdue follow-up/asthma.  Patient feels Flovent is not as effective as it once was for her asthma symptoms.  Patient reports more throat congestion and cough. She would like to try different inhaler, during her initial visit with Dr. Patsey Berthold she had wanted to put patient on a combination ICS/LAMA inhaler but she had already purchased a 3 month supply of Flovent. Her IgE was elevated at 123. Allergy panel positive to several different trees/grasses. Dr. Patsey Berthold recommended allergy referral. She states that her ENT doctor told her for allergy testing she would need to stop her Cymbalta and she did not want to do that.    Allergies  Allergen Reactions  . Codeine     hallucination  . Hydrocodone   . Levofloxacin Other (See Comments)  . Minocin [Minocycline]   . Nsaids Other (See Comments)    Other reaction(s): OTHER Other reaction(s): OTHER    Immunization History  Administered Date(s) Administered  . Influenza,inj,Quad PF,6+ Mos 06/07/2015, 06/18/2016, 07/01/2017, 08/03/2018, 07/12/2020  . Influenza-Unspecified 07/19/2014, 08/03/2018  . Moderna Sars-Covid-2 Vaccination 12/22/2019, 01/19/2020, 08/23/2020  . Tdap 01/29/2007, 10/14/2016    Past Medical History:  Diagnosis Date  . Anxiety   . Cervical spondylosis without myelopathy   . Cervicalgia 01/11/2014  . Degeneration of cervical intervertebral disc   . Endometriosis    . Eosinophilic asthma   . Fibromyalgia   . Headache(784.0)   . High cholesterol   . History of fusion of cervical spine 01/11/2014  . IC (interstitial cystitis)   . Myalgia and myositis, unspecified   . Osteoarthrosis, unspecified whether generalized or localized, unspecified site   . Osteopenia   . Other and unspecified hyperlipidemia   . Pain in joint, shoulder region 01/11/2014  . Squamous cell carcinoma    [facility]- place on chest    Tobacco History: Social History   Tobacco Use  Smoking Status Passive Smoke Exposure - Never Smoker  Smokeless Tobacco Never Used   Counseling given: Not Answered   Outpatient Medications Prior to Visit  Medication Sig Dispense Refill  . amitriptyline (ELAVIL) 25 MG tablet TAKE 1 TABLET BY MOUTH AT BEDTIME 90 tablet 3  . cyclobenzaprine (FLEXERIL) 10 MG tablet TAKE 1 TABLET BY MOUTH AT BEDTIME 30 tablet 4  . DULoxetine (CYMBALTA) 60 MG capsule Take 1 capsule by mouth once daily 90 capsule 0  . ezetimibe (ZETIA) 10 MG tablet Take 1 tablet by mouth once daily 90 tablet 0  . fluticasone (FLONASE) 50 MCG/ACT nasal spray Place into both nostrils daily.    Marland Kitchen levothyroxine (EUTHYROX) 25 MCG tablet TAKE 1 TABLET BY MOUTH ONCE DAILY BEFORE BREAKFAST 30 tablet 3  . Magnesium 500 MG CAPS Take 1 capsule by mouth at bedtime.    . Multiple Vitamin (MULTI-VITAMIN) tablet Take 1 tablet by mouth daily.    Marland Kitchen omeprazole (PRILOSEC) 40 MG capsule Take 40 mg by mouth daily.    Marland Kitchen  polyethylene glycol (MIRALAX / GLYCOLAX) packet Take 17 g by mouth daily.    . psyllium (REGULOID) 0.52 g capsule Take 0.52 g by mouth daily.    Marland Kitchen UNABLE TO FIND Metronidazole 1% + Ivermectin 1% Apply to face nightly    . FLOVENT HFA 110 MCG/ACT inhaler Inhale 2 puffs into the lungs 2 (two) times daily.      No facility-administered medications prior to visit.   Review of Systems  Review of Systems  Constitutional: Negative.   Respiratory: Positive for cough. Negative  for chest tightness, shortness of breath and wheezing.   Cardiovascular: Negative.    Physical Exam  BP 120/72 (BP Location: Left Arm, Patient Position: Sitting, Cuff Size: Normal)   Pulse 100   Temp (!) 97.1 F (36.2 C) (Temporal)   Ht 4' 11.5" (1.511 m)   Wt 147 lb (66.7 kg)   LMP 01/28/1997   SpO2 96%   BMI 29.19 kg/m  Physical Exam HENT:     Head: Normocephalic and atraumatic.     Mouth/Throat:     Mouth: Mucous membranes are moist.     Pharynx: Oropharynx is Brown.  Cardiovascular:     Rate and Rhythm: Normal rate and regular rhythm.  Pulmonary:     Effort: Pulmonary effort is normal.     Breath sounds: Normal breath sounds. No wheezing.  Musculoskeletal:        General: Normal range of motion.  Skin:    General: Skin is warm and dry.  Neurological:     General: No focal deficit present.     Mental Status: She is alert and oriented to person, place, and time. Mental status is at baseline.  Psychiatric:        Mood and Affect: Mood normal.        Behavior: Behavior normal.        Thought Content: Thought content normal.        Judgment: Judgment normal.      Lab Results:  CBC    Component Value Date/Time   WBC 7.2 10/12/2019 1011   WBC 4.9 07/03/2013 1205   WBC 20.7 (H) 11/28/2010 0953   RBC 4.65 10/12/2019 1011   RBC 4.44 07/03/2013 1205   RBC 4.42 11/28/2010 0953   HGB 13.7 10/12/2019 1011   HCT 41.4 10/12/2019 1011   PLT 307 10/12/2019 1011   MCV 89 10/12/2019 1011   MCV 88 07/03/2013 1205   MCH 29.5 10/12/2019 1011   MCH 29.8 07/03/2013 1205   MCH 29.2 11/28/2010 0953   MCHC 33.1 10/12/2019 1011   MCHC 33.8 07/03/2013 1205   MCHC 32.8 11/28/2010 0953   RDW 13.1 10/12/2019 1011   RDW 13.8 07/03/2013 1205   LYMPHSABS 2.4 10/12/2019 1011   MONOABS 0.3 11/27/2010 0410   EOSABS 0.9 (H) 10/12/2019 1011   BASOSABS 0.2 10/12/2019 1011    BMET    Component Value Date/Time   NA 138 10/12/2019 1011   NA 139 07/03/2013 1205   K 4.3 10/12/2019  1011   K 3.9 07/03/2013 1205   CL 102 10/12/2019 1011   CL 104 07/03/2013 1205   CO2 24 10/12/2019 1011   CO2 29 07/03/2013 1205   GLUCOSE 97 10/12/2019 1011   GLUCOSE 83 07/03/2013 1205   BUN 21 10/12/2019 1011   BUN 22 (H) 07/03/2013 1205   CREATININE 0.76 10/12/2019 1011   CREATININE 0.79 07/03/2013 1205   CALCIUM 9.7 10/12/2019 1011   CALCIUM 9.4 07/03/2013 1205  GFRNONAA 84 10/12/2019 1011   GFRNONAA >60 07/03/2013 1205   GFRAA 97 10/12/2019 1011   GFRAA >60 07/03/2013 1205    BNP No results found for: BNP  ProBNP No results found for: PROBNP  Imaging: No results found.   Assessment & Plan:   Cough variant asthma - Upper airway cough and congestion worse. Positive allergy panel; IgE 123, Eos absolute 900. Dr. Patsey Berthold previously recommending ICS/LABA combination maintenance inhaler  - Change Flovent 144mcg to Advair hfa 115-84mcg two puffs BID  - Adding Montelukast 10mg  qhs  - Refer to allergy. FU in 3-6 months     Martyn Ehrich, NP 12/07/2020

## 2020-12-07 NOTE — Patient Instructions (Addendum)
Pleasure meeting you today Ms Bessette  Recommendations: Changing Flovent to Advair - take two puffs morning and evening (rinse mouth after use) Starting Singulair (montelukast) 10mg  at bedtime Use Flonase 1 spray per nostril daily during allergy season   Refer: Allergy/asthma TG:PQDIY variant asthma/Elevated IgE  Follow-up: 3-6 months with Dr. Patsey Berthold

## 2020-12-11 ENCOUNTER — Other Ambulatory Visit: Payer: Self-pay

## 2020-12-11 NOTE — Telephone Encounter (Signed)
Patient needing a call back.  She is not sure if she needs labs to continue on her medications like cyclobenzaprine (FLEXERIL) 10 MG tablet.  She has a CPE appointment that needed to be rescheuled with Dr. Rosanna Randy.  She needs refills before next available appointment in June/July.  Thanks, [employer]

## 2020-12-12 NOTE — Telephone Encounter (Signed)
Please advise. Thanks.  

## 2020-12-13 NOTE — Telephone Encounter (Signed)
Okay to refill for 3 months 

## 2020-12-15 MED ORDER — CYCLOBENZAPRINE HCL 10 MG PO TABS: 10.0000 mg | ORAL_TABLET | Freq: Every day | ORAL | 3 refills | Status: DC

## 2020-12-24 ENCOUNTER — Other Ambulatory Visit: Payer: Self-pay | Admitting: Family Medicine

## 2020-12-24 DIAGNOSIS — E7849 Other hyperlipidemia: Secondary | ICD-10-CM

## 2020-12-24 NOTE — Telephone Encounter (Signed)
Future visit 01/02/21. Approved per protocol.  Requested Prescriptions  Pending Prescriptions Disp Refills  . ezetimibe (ZETIA) 10 MG tablet [Pharmacy Med Name: Ezetimibe 10 MG Oral Tablet] 90 tablet 0    Sig: Take 1 tablet by mouth once daily     Cardiovascular:  Antilipid - Sterol Transport Inhibitors Failed - 12/24/2020 11:54 AM      Failed - Total Cholesterol in normal range and within 360 days    Cholesterol, Total  Date Value Ref Range Status  10/12/2019 235 (H) 100 - 199 mg/dL Final         Failed - LDL in normal range and within 360 days    LDL Chol Calc (NIH)  Date Value Ref Range Status  10/12/2019 149 (H) 0 - 99 mg/dL Final         Failed - HDL in normal range and within 360 days    HDL  Date Value Ref Range Status  10/12/2019 71 >39 mg/dL Final         Failed - Triglycerides in normal range and within 360 days    Triglycerides  Date Value Ref Range Status  10/12/2019 86 0 - 149 mg/dL Final         Passed - Valid encounter within last 12 months    Recent Outpatient Visits          5 months ago Depression, major, in remission [facility])   [facility] Jerrol Banana., MD   10 months ago Supraclavicular mass   [facility] Jerrol Banana., MD   1 year ago [place] Jerrol Banana., MD   1 year ago DDD (degenerative disc disease), cervical   [facility] Jerrol Banana., MD   2 years ago Chronic interstitial cystitis   [facility] Jerrol Banana., MD

## 2020-12-28 NOTE — Progress Notes (Signed)
Agree with the details of the visit as noted by Elizabeth Walsh, NP.  C. Laura Joanny Dupree, MD [facility] 

## 2021-01-01 ENCOUNTER — Encounter: Payer: Self-pay | Admitting: Family Medicine

## 2021-01-02 ENCOUNTER — Other Ambulatory Visit: Payer: Self-pay

## 2021-01-02 ENCOUNTER — Ambulatory Visit
Admission: RE | Admit: 2021-01-02 | Discharge: 2021-01-02 | Disposition: A | Discharge status: Home / Self Care | Payer: PPO | Source: Ambulatory Visit | Attending: Obstetrics & Gynecology | Admitting: Obstetrics & Gynecology

## 2021-01-02 DIAGNOSIS — Z1231 Encounter for screening mammogram for malignant neoplasm of breast: Secondary | ICD-10-CM

## 2021-01-16 ENCOUNTER — Encounter: Payer: Self-pay | Admitting: Pulmonary Disease

## 2021-01-16 NOTE — Progress Notes (Signed)
Subjective:    Patient ID: [name] Brown, female    DOB: 09-29-1956, 65 y.o.   MRN: [id number]  HPI Patient is a 65 year old lifelong never smoker who presents for follow-up on the issue of cough.  She was initially evaluated on 29 October 2019.  Further details of that visit please see that note.  We did perform allergen testing which showed mild sensitivities across the board particularly to grasses and trees.  She should consider allergy evaluation.  She is followed by Dr. Tami Ribas for chronic pansinusitis which is likely triggering her issues.  She is wondering if she can get off of Flovent.  At her prior visit we were concerned that she needs ICS/LABA due to bronchospasm noted.  She has been on Flovent through ENT.   Review of Systems A 10 point review of systems was performed and it is as noted above otherwise negative.  Patient Active Problem List   Diagnosis Date Noted  . DDD (degenerative disc disease), cervical 10/09/2018  . Depression, major, in remission ([place]) 01/06/2018  . Hyperlipidemia 06/07/2015  . IBS (irritable bowel syndrome) 03/01/2015  . Depression 03/01/2015  . Fibromyalgia 03/01/2015  . Hypothyroid 03/01/2015  . Pain in joint, shoulder region 01/11/2014  . Cervicalgia 01/11/2014  . History of fusion of cervical spine 01/11/2014  . Headache(784.0) 03/29/2013  . Chronic interstitial cystitis 11/05/2012   Social History   Tobacco Use  . Smoking status: Passive Smoke Exposure - Never Smoker  . Smokeless tobacco: Never Used  Substance Use Topics  . Alcohol use: Yes    Alcohol/week: 0.0 - 1.0 standard drinks   Allergies  Allergen Reactions  . Codeine     hallucination  . Hydrocodone   . Levofloxacin Other (See Comments)  . Minocin [Minocycline]   . Nsaids Other (See Comments)    Other reaction(s): OTHER Other reaction(s): OTHER   Current Meds  Medication Sig  . amitriptyline (ELAVIL) 25 MG tablet TAKE 1 TABLET BY MOUTH AT BEDTIME  . fluticasone  (FLONASE) 50 MCG/ACT nasal spray Place into both nostrils daily.  . Magnesium 500 MG CAPS Take 1 capsule by mouth at bedtime.  . Multiple Vitamin (MULTI-VITAMIN) tablet Take 1 tablet by mouth daily.  Marland Kitchen omeprazole (PRILOSEC) 40 MG capsule Take 40 mg by mouth daily.  . polyethylene glycol (MIRALAX / GLYCOLAX) packet Take 17 g by mouth daily.  . psyllium (REGULOID) 0.52 g capsule Take 0.52 g by mouth daily.  . [DISCONTINUED] cyclobenzaprine (FLEXERIL) 10 MG tablet TAKE 1 TABLET BY MOUTH AT BEDTIME  . [DISCONTINUED] DULoxetine (CYMBALTA) 60 MG capsule Take 1 capsule by mouth once daily  . [DISCONTINUED] EUTHYROX 25 MCG tablet TAKE 1 TABLET BY MOUTH ONCE DAILY BEFORE BREAKFAST  . [DISCONTINUED] ezetimibe (ZETIA) 10 MG tablet Take 1 tablet by mouth once daily  . [DISCONTINUED] FLOVENT HFA 110 MCG/ACT inhaler Inhale 2 puffs into the lungs 2 (two) times daily.        Objective:   Physical Exam BP 112/68 (BP Location: Left Arm, Cuff Size: Normal)   Pulse 88   Ht 4' 11.5" (1.511 m)   Wt 146 lb 3.2 oz (66.3 kg)   LMP 01/28/1997   SpO2 97%   BMI 29.03 kg/m  GENERAL: Awake and alert, no focal deficit.  Fully ambulatory. HEAD: Normocephalic, atraumatic.  EYES: Pupils equal, round, reactive to light. No scleral icterus.  MOUTH: Nose/mouth/throat not examined due to masking requirements for COVID 19. NECK: Supple. No thyromegaly. No nodules. No JVD. Trachea  midline. PULMONARY: Good air entry bilaterally.  No wheezes noted today.   CARDIOVASCULAR: S1 and S2. Regular rate and rhythm.  GASTROINTESTINAL: Nondistended. MUSCULOSKELETAL: No joint swelling, no clubbing, nor edema.  NEUROLOGIC: No focal deficits.  Awake and alert, fluent speech. SKIN: Intact,warm,dry.  No petechiae or rashes on limited exam. PSYCH: Mood and behavior appropriate.      Assessment & Plan:     ICD-10-CM   1. Cough variant asthma  J45.991    Patient wants to try off of Flovent Advised to taper Reschedule PFTs Resume  prior dose if symptoms recur I am not hopeful she will do well off of inhalers.  2. Eosinophilic bronchitis  S28    D72.18    This may be contributing to her cough   Will see the patient in follow-up in 3 months time she is to contact us prior to that time should any new problems arise.  Renold Don, MD [facility]   *This note was dictated using voice recognition software/Dragon.  Despite best efforts to proofread, errors can occur which can change the meaning.  Any change was purely unintentional.

## 2021-01-29 ENCOUNTER — Encounter: Payer: Self-pay | Admitting: Family Medicine

## 2021-01-29 ENCOUNTER — Other Ambulatory Visit: Payer: Self-pay

## 2021-01-29 ENCOUNTER — Ambulatory Visit (INDEPENDENT_AMBULATORY_CARE_PROVIDER_SITE_OTHER): Payer: PPO | Admitting: Family Medicine

## 2021-01-29 VITALS — BP 116/71 | HR 106 | Temp 98.1°F | Resp 16 | Ht 59.5 in | Wt 147.0 lb

## 2021-01-29 DIAGNOSIS — E7849 Other hyperlipidemia: Secondary | ICD-10-CM

## 2021-01-29 DIAGNOSIS — N393 Stress incontinence (female) (male): Secondary | ICD-10-CM

## 2021-01-29 DIAGNOSIS — M25519 Pain in unspecified shoulder: Secondary | ICD-10-CM

## 2021-01-29 DIAGNOSIS — F32A Depression, unspecified: Secondary | ICD-10-CM

## 2021-01-29 DIAGNOSIS — M542 Cervicalgia: Secondary | ICD-10-CM | POA: Diagnosis not present

## 2021-01-29 DIAGNOSIS — E559 Vitamin D deficiency, unspecified: Secondary | ICD-10-CM

## 2021-01-29 DIAGNOSIS — E039 Hypothyroidism, unspecified: Secondary | ICD-10-CM | POA: Diagnosis not present

## 2021-01-29 DIAGNOSIS — R52 Pain, unspecified: Secondary | ICD-10-CM | POA: Diagnosis not present

## 2021-01-29 DIAGNOSIS — F325 Major depressive disorder, single episode, in full remission: Secondary | ICD-10-CM

## 2021-01-29 DIAGNOSIS — M797 Fibromyalgia: Secondary | ICD-10-CM | POA: Diagnosis not present

## 2021-01-29 MED ORDER — DULOXETINE HCL 60 MG PO CPEP: 60.0000 mg | ORAL_CAPSULE | Freq: Every day | ORAL | 0 refills | Status: DC

## 2021-01-29 NOTE — Progress Notes (Signed)
Established patient visit   Patient: [name] Brown   DOB: [birth year]   65 y.o. Female  MRN: [id number] Visit Date: 01/29/2021  Today's healthcare provider: Wilhemena Durie, MD   Chief Complaint  Patient presents with  . Depression  . Hypothyroidism   Subjective    HPI  Patient comes in today for follow-up.  Main concern is stress as she is caring for her aging mother and stepfather.  This is starting to wear out.  She would like to lose weight but is having difficulty doing so.  All her medical problems are stable and she is taking her medications as prescribed. Depression, Follow-up  She  was last seen for this 8 months ago. Changes made at last visit include no medication changes.   She reports good compliance with treatment. She is not having side effects.   She reports good tolerance of treatment. She feels she is Improved since last visit.  Depression screen [facility] 2/9 01/29/2021 06/29/2020 10/05/2019  Decreased Interest 0 0 0  Down, Depressed, Hopeless 0 0 0  PHQ - 2 Score 0 0 0  Altered sleeping 1 1 1   Tired, decreased energy 1 1 2   Change in appetite 1 2 1   Feeling bad or failure about yourself  0 0 0  Trouble concentrating 0 0 0  Moving slowly or fidgety/restless 0 0 0  Suicidal thoughts 0 0 0  PHQ-9 Score 3 4 4   Difficult doing work/chores Not difficult at all Not difficult at all Not difficult at all   Hypothyroid, follow-up  Lab Results  Component Value Date   TSH 2.510 10/12/2019   TSH 2.120 02/19/2018   TSH 2.680 12/11/2016   FREET4 0.88 11/28/2010   T4TOTAL 3.6 (L) 11/27/2010   Wt Readings from Last 3 Encounters:  01/29/21 147 lb (66.7 kg)  12/07/20 147 lb (66.7 kg)  06/29/20 148 lb 6.4 oz (67.3 kg)    She was last seen for hypothyroid 8 months ago.  Management since that visit includes no medication changes. She reports good compliance with treatment. She is not having side effects.   Symptoms: No change in energy level No  constipation  No diarrhea No heat / cold intolerance  No nervousness No palpitations  Yes weight changes         Medications: Outpatient Medications Prior to Visit  Medication Sig  . amitriptyline (ELAVIL) 25 MG tablet TAKE 1 TABLET BY MOUTH AT BEDTIME  . cyclobenzaprine (FLEXERIL) 10 MG tablet Take 1 tablet (10 mg total) by mouth at bedtime.  . DULoxetine (CYMBALTA) 60 MG capsule Take 1 capsule by mouth once daily  . ezetimibe (ZETIA) 10 MG tablet Take 1 tablet by mouth once daily  . fluticasone (FLONASE) 50 MCG/ACT nasal spray Place into both nostrils daily.  . fluticasone-salmeterol (ADVAIR HFA) 115-21 MCG/ACT inhaler Inhale 2 puffs into the lungs 2 (two) times daily.  Marland Kitchen levothyroxine (EUTHYROX) 25 MCG tablet TAKE 1 TABLET BY MOUTH ONCE DAILY BEFORE BREAKFAST  . Magnesium 500 MG CAPS Take 1 capsule by mouth at bedtime.  . montelukast (SINGULAIR) 10 MG tablet Take 1 tablet (10 mg total) by mouth at bedtime.  . Multiple Vitamin (MULTI-VITAMIN) tablet Take 1 tablet by mouth daily.  Marland Kitchen omeprazole (PRILOSEC) 40 MG capsule Take 40 mg by mouth daily.  . polyethylene glycol (MIRALAX / GLYCOLAX) packet Take 17 g by mouth daily.  . psyllium (REGULOID) 0.52 g capsule Take 0.52 g by mouth daily.  Marland Kitchen  UNABLE TO FIND Metronidazole 1% + Ivermectin 1% Apply to face nightly   No facility-administered medications prior to visit.    Review of Systems      Objective    BP 116/71   Pulse (!) 106   Temp 98.1 F (36.7 C)   Resp 16   Ht 4' 11.5" (1.511 m)   Wt 147 lb (66.7 kg)   LMP 01/28/1997   BMI 29.19 kg/m  BP Readings from Last 3 Encounters:  01/29/21 116/71  12/07/20 120/72  06/29/20 100/67   Wt Readings from Last 3 Encounters:  01/29/21 147 lb (66.7 kg)  12/07/20 147 lb (66.7 kg)  06/29/20 148 lb 6.4 oz (67.3 kg)       Physical Exam Vitals reviewed.  Constitutional:      Appearance: Normal appearance.  HENT:     Head: Normocephalic and atraumatic.     Right Ear: External  ear normal.     Left Ear: External ear normal.  Eyes:     General: No scleral icterus.    Conjunctiva/sclera: Conjunctivae normal.  Cardiovascular:     Rate and Rhythm: Normal rate and regular rhythm.     Pulses: Normal pulses.     Heart sounds: Normal heart sounds.  Pulmonary:     Effort: Pulmonary effort is normal.     Breath sounds: Normal breath sounds.  Musculoskeletal:     Right lower leg: No edema.     Left lower leg: No edema.  Skin:    General: Skin is warm and dry.     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  Neurological:     General: No focal deficit present.     Mental Status: She is alert and oriented to person, place, and time.  Psychiatric:        Mood and Affect: Mood normal.        Behavior: Behavior normal.        Thought Content: Thought content normal.        Judgment: Judgment normal.       No results found for any visits on 01/29/21.  Assessment & Plan     1. Hypothyroidism, unspecified type On Synthroid 25 mcg daily - CBC with Differential/Platelet - TSH  2. Other hyperlipidemia On Zetia.  Patient states she has been statin intolerant in the past - Comprehensive metabolic panel - Lipid panel  3. Depression, major, in remission ([place]) Clinically stable on duloxetine and low-dose Elavil. Patient very stressed out over family situation.  We will try to get social work help for things and getting her mother and stepfather referrals - AMB Referral to [place]  4. Vitamin D deficiency  - VITAMIN D 25 Hydroxy (Vit-D Deficiency, Fractures)  5. Depression  - DULoxetine (CYMBALTA) 60 MG capsule; Take 1 capsule (60 mg total) by mouth daily.  Dispense: 90 capsule; Refill: 0  6. Diffuse pain Control with duloxetine and Elavil. - DULoxetine (CYMBALTA) 60 MG capsule; Take 1 capsule (60 mg total) by mouth daily.  Dispense: 90 capsule; Refill: 0  7. Fibromyalgia muscle pain  - DULoxetine (CYMBALTA)  60 MG capsule; Take 1 capsule (60 mg total) by mouth daily.  Dispense: 90 capsule; Refill: 0  8. Pain in joint, shoulder region  - DULoxetine (CYMBALTA) 60 MG capsule; Take 1 capsule (60 mg total) by mouth daily.  Dispense: 90 capsule; Refill: 0  9. Cervicalgia  - DULoxetine (CYMBALTA) 60 MG capsule; Take 1 capsule (60 mg  total) by mouth daily.  Dispense: 90 capsule; Refill: 0  10. SI (stress incontinence), female  - DULoxetine (CYMBALTA) 60 MG capsule; Take 1 capsule (60 mg total) by mouth daily.  Dispense: 90 capsule; Refill: 0 11.  Lipomas   No follow-ups on file.      I, Wilhemena Durie, MD, have reviewed all documentation for this visit. The documentation on 01/31/21 for the exam, diagnosis, procedures, and orders are all accurate and complete.    Tali Cleaves [place] Mon, MD  [facility] [phone] (phone) [phone] (fax)  [place]

## 2021-02-14 DIAGNOSIS — E559 Vitamin D deficiency, unspecified: Secondary | ICD-10-CM | POA: Diagnosis not present

## 2021-02-14 DIAGNOSIS — E039 Hypothyroidism, unspecified: Secondary | ICD-10-CM | POA: Diagnosis not present

## 2021-02-14 DIAGNOSIS — E7849 Other hyperlipidemia: Secondary | ICD-10-CM | POA: Diagnosis not present

## 2021-02-15 DIAGNOSIS — D2272 Melanocytic nevi of left lower limb, including hip: Secondary | ICD-10-CM | POA: Diagnosis not present

## 2021-02-15 DIAGNOSIS — D170 Benign lipomatous neoplasm of skin and subcutaneous tissue of head, face and neck: Secondary | ICD-10-CM | POA: Diagnosis not present

## 2021-02-15 DIAGNOSIS — L718 Other rosacea: Secondary | ICD-10-CM | POA: Diagnosis not present

## 2021-02-15 DIAGNOSIS — D225 Melanocytic nevi of trunk: Secondary | ICD-10-CM | POA: Diagnosis not present

## 2021-02-15 DIAGNOSIS — Z85828 Personal history of other malignant neoplasm of skin: Secondary | ICD-10-CM | POA: Diagnosis not present

## 2021-02-15 DIAGNOSIS — D2261 Melanocytic nevi of right upper limb, including shoulder: Secondary | ICD-10-CM | POA: Diagnosis not present

## 2021-02-15 LAB — COMPREHENSIVE METABOLIC PANEL
ALT: 16 IU/L (ref 0–32)
AST: 20 IU/L (ref 0–40)
Albumin/Globulin Ratio: 1.8 (ref 1.2–2.2)
Albumin: 4.3 g/dL (ref 3.8–4.8)
Alkaline Phosphatase: 93 IU/L (ref 44–121)
BUN/Creatinine Ratio: 29 — ABNORMAL HIGH (ref 12–28)
BUN: 23 mg/dL (ref 8–27)
Bilirubin Total: 0.2 mg/dL (ref 0.0–1.2)
CO2: 24 mmol/L (ref 20–29)
Calcium: 9.5 mg/dL (ref 8.7–10.3)
Chloride: 100 mmol/L (ref 96–106)
Creatinine, Ser: 0.8 mg/dL (ref 0.57–1.00)
Globulin, Total: 2.4 g/dL (ref 1.5–4.5)
Glucose: 99 mg/dL (ref 65–99)
Potassium: 4.4 mmol/L (ref 3.5–5.2)
Sodium: 137 mmol/L (ref 134–144)
Total Protein: 6.7 g/dL (ref 6.0–8.5)
eGFR: 82 mL/min/{1.73_m2} (ref >59)

## 2021-02-15 LAB — CBC WITH DIFFERENTIAL/PLATELET
Basophils Absolute: 0.1 10*3/uL (ref 0.0–0.2)
Basos: 2 %
EOS (ABSOLUTE): 0.8 10*3/uL — ABNORMAL HIGH (ref 0.0–0.4)
Eos: 12 %
Hematocrit: 38.9 % (ref 34.0–46.6)
Hemoglobin: 13.2 g/dL (ref 11.1–15.9)
Immature Grans (Abs): 0 10*3/uL (ref 0.0–0.1)
Immature Granulocytes: 0 %
Lymphocytes Absolute: 1.8 10*3/uL (ref 0.7–3.1)
Lymphs: 24 %
MCH: 29.8 pg (ref 26.6–33.0)
MCHC: 33.9 g/dL (ref 31.5–35.7)
MCV: 88 fL (ref 79–97)
Monocytes Absolute: 0.5 10*3/uL (ref 0.1–0.9)
Monocytes: 7 %
Neutrophils Absolute: 4.1 10*3/uL (ref 1.4–7.0)
Neutrophils: 55 %
Platelets: 302 10*3/uL (ref 150–450)
RBC: 4.43 x10E6/uL (ref 3.77–5.28)
RDW: 13.1 % (ref 11.7–15.4)
WBC: 7.3 10*3/uL (ref 3.4–10.8)

## 2021-02-15 LAB — LIPID PANEL
Chol/HDL Ratio: 3.2 ratio (ref 0.0–4.4)
Cholesterol, Total: 217 mg/dL — ABNORMAL HIGH (ref 100–199)
HDL: 68 mg/dL (ref >39)
LDL Chol Calc (NIH): 138 mg/dL — ABNORMAL HIGH (ref 0–99)
Triglycerides: 63 mg/dL (ref 0–149)
VLDL Cholesterol Cal: 11 mg/dL (ref 5–40)

## 2021-02-15 LAB — VITAMIN D 25 HYDROXY (VIT D DEFICIENCY, FRACTURES): Vit D, 25-Hydroxy: 48.4 ng/mL (ref 30.0–100.0)

## 2021-02-15 LAB — TSH: TSH: 3.12 u[IU]/mL (ref 0.450–4.500)

## 2021-02-19 ENCOUNTER — Other Ambulatory Visit: Payer: Self-pay | Admitting: Family Medicine

## 2021-02-19 DIAGNOSIS — M797 Fibromyalgia: Secondary | ICD-10-CM

## 2021-02-19 DIAGNOSIS — R519 Headache, unspecified: Secondary | ICD-10-CM

## 2021-02-19 DIAGNOSIS — E039 Hypothyroidism, unspecified: Secondary | ICD-10-CM

## 2021-02-19 DIAGNOSIS — R52 Pain, unspecified: Secondary | ICD-10-CM

## 2021-02-19 NOTE — Telephone Encounter (Signed)
Requested medication (s) are due for refill today: Euthyrox is not on profile  Requested medication (s) are on the active medication list:no  Last refill:  9.30.21  #30  3 refills  Future visit scheduled Yes 07/31/21  Notes to clinic: Not on profile. Other request for Elavil , prescription has expired. Please review.  Requested Prescriptions  Pending Prescriptions Disp Refills   EUTHYROX 25 MCG tablet [Pharmacy Med Name: Euthyrox 25 MCG Oral Tablet] 30 tablet 0    Sig: TAKE 1 TABLET BY MOUTH ONCE DAILY BEFORE BREAKFAST      Endocrinology:  Hypothyroid Agents Failed - 02/19/2021 11:41 AM      Failed - TSH needs to be rechecked within 3 months after an abnormal result. Refill until TSH is due.      Passed - TSH in normal range and within 360 days    TSH  Date Value Ref Range Status  02/14/2021 3.120 0.450 - 4.500 uIU/mL Final          Passed - Valid encounter within last 12 months    Recent Outpatient Visits           3 weeks ago Hypothyroidism, unspecified type   [facility] Jerrol Banana., MD   7 months ago Depression, major, in remission [facility])   [facility] Jerrol Banana., MD   12 months ago Supraclavicular mass   [facility] Jerrol Banana., MD   1 year ago [place] Jerrol Banana., MD   1 year ago DDD (degenerative disc disease), cervical   [facility] Jerrol Banana., MD       Future Appointments             In 5 months Jerrol Banana., MD [facility]               amitriptyline (ELAVIL) 25 MG tablet [Pharmacy Med Name: Amitriptyline HCl 25 MG Oral Tablet] 90 tablet 0    Sig: TAKE 1 TABLET BY MOUTH AT BEDTIME      Psychiatry:  Antidepressants - Heterocyclics (TCAs) Passed - 02/19/2021 11:41 AM      Passed - Completed PHQ-2 or PHQ-9 in the last 360 days      Passed - Valid encounter within last 6  months    Recent Outpatient Visits           3 weeks ago Hypothyroidism, unspecified type   [facility] Jerrol Banana., MD   7 months ago Depression, major, in remission [facility])   [facility] Jerrol Banana., MD   12 months ago Supraclavicular mass   [facility] Jerrol Banana., MD   1 year ago [place] Jerrol Banana., MD   1 year ago DDD (degenerative disc disease), cervical   [facility] Jerrol Banana., MD       Future Appointments             In 5 months Jerrol Banana., MD [facility] [place]

## 2021-03-20 ENCOUNTER — Other Ambulatory Visit: Payer: Self-pay | Admitting: Family Medicine

## 2021-03-20 DIAGNOSIS — E7849 Other hyperlipidemia: Secondary | ICD-10-CM

## 2021-04-16 ENCOUNTER — Other Ambulatory Visit: Payer: Self-pay | Admitting: Family Medicine

## 2021-04-16 NOTE — Telephone Encounter (Signed)
Requested medication (s) are due for refill today: yes   Requested medication (s) are on the active medication list: yes   Last refill:  03/18/2021  Future visit scheduled: yes   Notes to clinic: this refill cannot be delegated    Requested Prescriptions  Pending Prescriptions Disp Refills   cyclobenzaprine (FLEXERIL) 10 MG tablet [Pharmacy Med Name: Cyclobenzaprine HCl 10 MG Oral Tablet] 30 tablet 0    Sig: TAKE 1 TABLET BY MOUTH AT BEDTIME      Not Delegated - Analgesics:  Muscle Relaxants Failed - 04/16/2021 10:37 AM      Failed - This refill cannot be delegated      Passed - Valid encounter within last 6 months    Recent Outpatient Visits           2 months ago Hypothyroidism, unspecified type   [facility] Jerrol Banana., MD   9 months ago Depression, major, in remission [facility])   [facility] Jerrol Banana., MD   1 year ago Supraclavicular mass   [facility] Jerrol Banana., MD   1 year ago [place] Jerrol Banana., MD   2 years ago DDD (degenerative disc disease), cervical   [facility] Jerrol Banana., MD       Future Appointments             In 3 months Jerrol Banana., MD [facility], [place]

## 2021-05-03 ENCOUNTER — Other Ambulatory Visit: Payer: Self-pay | Admitting: Family Medicine

## 2021-05-03 DIAGNOSIS — F32A Depression, unspecified: Secondary | ICD-10-CM

## 2021-05-03 DIAGNOSIS — M542 Cervicalgia: Secondary | ICD-10-CM

## 2021-05-03 DIAGNOSIS — M797 Fibromyalgia: Secondary | ICD-10-CM

## 2021-05-03 DIAGNOSIS — M25519 Pain in unspecified shoulder: Secondary | ICD-10-CM

## 2021-05-03 DIAGNOSIS — R52 Pain, unspecified: Secondary | ICD-10-CM

## 2021-05-03 DIAGNOSIS — N393 Stress incontinence (female) (male): Secondary | ICD-10-CM

## 2021-05-16 ENCOUNTER — Other Ambulatory Visit: Payer: Self-pay | Admitting: Family Medicine

## 2021-05-16 MED ORDER — CYCLOBENZAPRINE HCL 10 MG PO TABS: 10.0000 mg | ORAL_TABLET | Freq: Every day | ORAL | 0 refills | Status: DC

## 2021-05-16 NOTE — Telephone Encounter (Signed)
Requested medication (s) are due for refill today: yes   Requested medication (s) are on the active medication list: yes   Last refill:  04/16/2021  Future visit scheduled: yes   Notes to clinic:  this refill cannot be delegated    Requested Prescriptions  Pending Prescriptions Disp Refills   cyclobenzaprine (FLEXERIL) 10 MG tablet 30 tablet 0    Sig: Take 1 tablet (10 mg total) by mouth at bedtime.     Not Delegated - Analgesics:  Muscle Relaxants Failed - 05/16/2021 11:46 AM      Failed - This refill cannot be delegated      Passed - Valid encounter within last 6 months    Recent Outpatient Visits           3 months ago Hypothyroidism, unspecified type   [facility] Jerrol Banana., MD   10 months ago Depression, major, in remission [facility])   [facility] Jerrol Banana., MD   1 year ago Supraclavicular mass   [facility] Jerrol Banana., MD   1 year ago [place] Jerrol Banana., MD   2 years ago DDD (degenerative disc disease), cervical   [facility] Jerrol Banana., MD       Future Appointments             In 2 months Jerrol Banana., MD [facility], [place]

## 2021-05-16 NOTE — Telephone Encounter (Signed)
Medication Refill - Medication: cyclobenzaprine (FLEXERIL) 10 MG tablet  Has the patient contacted their pharmacy? Yes.   (Agent: If no, request that the patient contact the pharmacy for the refill.) (Agent: If yes, when and what did the pharmacy advise?)  Preferred Pharmacy (with phone number or street name):  [facility] [address], [place] [address]  Phone: [phone] Fax: [phone]    Agent: Please be advised that RX refills may take up to 3 business days. We ask that you follow-up with your pharmacy.

## 2021-06-18 ENCOUNTER — Other Ambulatory Visit: Payer: Self-pay | Admitting: Family Medicine

## 2021-06-18 DIAGNOSIS — E7849 Other hyperlipidemia: Secondary | ICD-10-CM

## 2021-07-24 ENCOUNTER — Ambulatory Visit (HOSPITAL_BASED_OUTPATIENT_CLINIC_OR_DEPARTMENT_OTHER): Payer: PPO | Admitting: Obstetrics & Gynecology

## 2021-07-31 ENCOUNTER — Ambulatory Visit (INDEPENDENT_AMBULATORY_CARE_PROVIDER_SITE_OTHER): Payer: PPO | Admitting: Family Medicine

## 2021-07-31 ENCOUNTER — Encounter: Payer: Self-pay | Admitting: Family Medicine

## 2021-07-31 ENCOUNTER — Other Ambulatory Visit: Payer: Self-pay

## 2021-07-31 VITALS — BP 92/67 | HR 70 | Resp 16 | Ht 60.0 in | Wt 141.0 lb

## 2021-07-31 DIAGNOSIS — M25519 Pain in unspecified shoulder: Secondary | ICD-10-CM

## 2021-07-31 DIAGNOSIS — N393 Stress incontinence (female) (male): Secondary | ICD-10-CM

## 2021-07-31 DIAGNOSIS — F325 Major depressive disorder, single episode, in full remission: Secondary | ICD-10-CM

## 2021-07-31 DIAGNOSIS — M797 Fibromyalgia: Secondary | ICD-10-CM | POA: Diagnosis not present

## 2021-07-31 DIAGNOSIS — F32A Depression, unspecified: Secondary | ICD-10-CM | POA: Diagnosis not present

## 2021-07-31 DIAGNOSIS — E039 Hypothyroidism, unspecified: Secondary | ICD-10-CM | POA: Diagnosis not present

## 2021-07-31 DIAGNOSIS — R52 Pain, unspecified: Secondary | ICD-10-CM

## 2021-07-31 DIAGNOSIS — F324 Major depressive disorder, single episode, in partial remission: Secondary | ICD-10-CM

## 2021-07-31 DIAGNOSIS — M542 Cervicalgia: Secondary | ICD-10-CM | POA: Diagnosis not present

## 2021-07-31 DIAGNOSIS — E7849 Other hyperlipidemia: Secondary | ICD-10-CM | POA: Diagnosis not present

## 2021-07-31 MED ORDER — DULOXETINE HCL 60 MG PO CPEP: 60.0000 mg | ORAL_CAPSULE | Freq: Every day | ORAL | 3 refills | Status: DC

## 2021-07-31 MED ORDER — CYCLOBENZAPRINE HCL 10 MG PO TABS: 10.0000 mg | ORAL_TABLET | Freq: Every day | ORAL | 11 refills | Status: DC

## 2021-07-31 NOTE — Progress Notes (Signed)
I,[name] Brown,acting as a scribe for [name] Durie, MD.,have documented all relevant documentation on the behalf of [name] Durie, MD,as directed by  [name] Durie, MD while in the presence of [name] Durie, MD.   Established patient visit   Patient: [name] Brown   DOB: [birth year]   65 y.o. Female  MRN: [id number] Visit Date: 07/31/2021  Today's healthcare provider: Wilhemena Durie, MD   Chief Complaint  Patient presents with   Follow-up   Hyperlipidemia   Hypothyroidism   Depression   Subjective    HPI  Patient comes in today for follow-up.  Her well woman exam tomorrow.  She is having a lot of stress due to the care of her mother and stepfather.  She also has 8 grandchildren between the ages of 68 and 7 Lipid/Cholesterol, follow-up  Last Lipid Panel: Lab Results  Component Value Date   CHOL 217 (H) 02/14/2021   LDLCALC 138 (H) 02/14/2021   HDL 68 02/14/2021   TRIG 63 02/14/2021    She was last seen for this 5 months ago.  Management since that visit includes; labs checked showing-normal.  She reports good compliance with treatment. She is not having side effects. none  She is following a Regular diet. Current exercise: walking  Last metabolic panel Lab Results  Component Value Date   GLUCOSE 99 02/14/2021   NA 137 02/14/2021   K 4.4 02/14/2021   BUN 23 02/14/2021   CREATININE 0.80 02/14/2021   EGFR 82 02/14/2021   GFRNONAA 84 10/12/2019   CALCIUM 9.5 02/14/2021   AST 20 02/14/2021   ALT 16 02/14/2021   The 10-year ASCVD risk score (Arnett DK, et al., 2019) is: 2.8%  --------------------------------------------------------------------------------------------------- Hypothyroid, follow-up  Lab Results  Component Value Date   TSH 3.120 02/14/2021   TSH 2.510 10/12/2019   TSH 2.120 02/19/2018   FREET4 0.88 11/28/2010   T4TOTAL 3.6 (L) 11/27/2010   Wt Readings from Last 3 Encounters:  07/31/21 141 lb (64 kg)   01/29/21 147 lb (66.7 kg)  12/07/20 147 lb (66.7 kg)    She was last seen for hypothyroid 5 months ago.  Management since that visit includes; labs checked-continue medications. She reports good compliance with treatment. She is not having side effects. none  ----------------------------------------------------------------------------------------- Depression, Follow-up  She  was last seen for this 5 months ago. Changes made at last visit include; Clinically stable on duloxetine and low-dose Elavil. Patient very stressed out over family situation.  We will try to get social work help for things and getting her mother and stepfather referrals.   She reports good compliance with treatment. She is not having side effects. none  She reports good tolerance of treatment. Current symptoms include: depressed mood, fatigue, and insomnia She feels she is Improved since last visit.  Depression screen [facility] 2/9 07/31/2021 01/29/2021 06/29/2020  Decreased Interest 0 0 0  Down, Depressed, Hopeless 2 0 0  PHQ - 2 Score 2 0 0  Altered sleeping 1 1 1   Tired, decreased energy 2 1 1   Change in appetite 1 1 2   Feeling bad or failure about yourself  0 0 0  Trouble concentrating 0 0 0  Moving slowly or fidgety/restless 0 0 0  Suicidal thoughts 0 0 0  PHQ-9 Score 6 3 4   Difficult doing work/chores Not difficult at all Not difficult at all Not difficult at all    -----------------------------------------------------------------------------------------     Medications: Outpatient Medications  Prior to Visit  Medication Sig   amitriptyline (ELAVIL) 25 MG tablet TAKE 1 TABLET BY MOUTH AT BEDTIME   cyclobenzaprine (FLEXERIL) 10 MG tablet TAKE 1 TABLET BY MOUTH AT BEDTIME   DULoxetine (CYMBALTA) 60 MG capsule Take 1 capsule by mouth once daily   EUTHYROX 25 MCG tablet TAKE 1 TABLET BY MOUTH ONCE DAILY BEFORE BREAKFAST   ezetimibe (ZETIA) 10 MG tablet Take 1 tablet by mouth once daily   fluticasone  (FLONASE) 50 MCG/ACT nasal spray Place into both nostrils daily.   fluticasone-salmeterol (ADVAIR HFA) 115-21 MCG/ACT inhaler Inhale 2 puffs into the lungs 2 (two) times daily.   Magnesium 500 MG CAPS Take 1 capsule by mouth at bedtime.   Multiple Vitamin (MULTI-VITAMIN) tablet Take 1 tablet by mouth daily.   omeprazole (PRILOSEC) 40 MG capsule Take 40 mg by mouth daily.   polyethylene glycol (MIRALAX / GLYCOLAX) packet Take 17 g by mouth daily.   psyllium (REGULOID) 0.52 g capsule Take 0.52 g by mouth daily.   UNABLE TO FIND Metronidazole 1% + Ivermectin 1% Apply to face nightly   montelukast (SINGULAIR) 10 MG tablet Take 1 tablet (10 mg total) by mouth at bedtime. (Patient not taking: Reported on 07/31/2021)   No facility-administered medications prior to visit.    Review of Systems  Constitutional:  Negative for appetite change, chills, fatigue and fever.  Respiratory:  Negative for chest tightness and shortness of breath.   Cardiovascular:  Negative for chest pain and palpitations.  Gastrointestinal:  Negative for abdominal pain, nausea and vomiting.  Neurological:  Negative for dizziness and weakness.   Last thyroid functions Lab Results  Component Value Date   TSH 3.120 02/14/2021   T4TOTAL 3.6 (L) 11/27/2010       Objective    BP 92/67 (BP Location: Right Arm, Patient Position: Sitting, Cuff Size: Normal)   Pulse 70   Resp 16   Ht 5' (1.524 m)   Wt 141 lb (64 kg)   LMP 01/28/1997   SpO2 98%   BMI 27.54 kg/m  BP Readings from Last 3 Encounters:  08/01/21 117/71  07/31/21 92/67  01/29/21 116/71   Wt Readings from Last 3 Encounters:  08/01/21 141 lb 6.4 oz (64.1 kg)  07/31/21 141 lb (64 kg)  01/29/21 147 lb (66.7 kg)      Physical Exam Vitals reviewed.  Constitutional:      Appearance: Normal appearance.  HENT:     Head: Normocephalic and atraumatic.     Right Ear: External ear normal.     Left Ear: External ear normal.  Eyes:     General: No scleral  icterus.    Conjunctiva/sclera: Conjunctivae normal.  Cardiovascular:     Rate and Rhythm: Normal rate and regular rhythm.     Pulses: Normal pulses.     Heart sounds: Normal heart sounds.  Pulmonary:     Effort: Pulmonary effort is normal.     Breath sounds: Normal breath sounds.  Musculoskeletal:     Right lower leg: No edema.     Left lower leg: No edema.  Skin:    General: Skin is warm and dry.     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  Neurological:     General: No focal deficit present.     Mental Status: She is alert and oriented to person, place, and time.  Psychiatric:        Mood and Affect: Mood normal.  Behavior: Behavior normal.        Thought Content: Thought content normal.        Judgment: Judgment normal.      No results found for any visits on 07/31/21.  Assessment & Plan     1. Other hyperlipidemia Patient states he is statin intolerant.  On Zetia  2. Hypothyroidism, unspecified type On Synthroid for thyroid supplementation  3. Depression, major, in remission ([place]) On duloxetine   4. Depression HQ 9 score today is 6 - DULoxetine (CYMBALTA) 60 MG capsule; Take 1 capsule (60 mg total) by mouth daily.  Dispense: 90 capsule; Refill: 3  5. Diffuse pain On duloxetine and low-dose Elavil - DULoxetine (CYMBALTA) 60 MG capsule; Take 1 capsule (60 mg total) by mouth daily.  Dispense: 90 capsule; Refill: 3  6. Fibromyalgia muscle pain  - cyclobenzaprine (FLEXERIL) 10 MG tablet; Take 1 tablet (10 mg total) by mouth at bedtime.  Dispense: 30 tablet; Refill: 11 - DULoxetine (CYMBALTA) 60 MG capsule; Take 1 capsule (60 mg total) by mouth daily.  Dispense: 90 capsule; Refill: 3  7. Pain in joint, shoulder region Pain could be originating the shoulder or the neck.  Consider physical therapy referral. - DULoxetine (CYMBALTA) 60 MG capsule; Take 1 capsule (60 mg total) by mouth daily.  Dispense: 90 capsule; Refill: 3  8.  Cervicalgia  - DULoxetine (CYMBALTA) 60 MG capsule; Take 1 capsule (60 mg total) by mouth daily.  Dispense: 90 capsule; Refill: 3  9. SI (stress incontinence), female  - DULoxetine (CYMBALTA) 60 MG capsule; Take 1 capsule (60 mg total) by mouth daily.  Dispense: 90 capsule; Refill: 3   No follow-ups on file.      I, [name] Durie, MD, have reviewed all documentation for this visit. The documentation on 08/05/21 for the exam, diagnosis, procedures, and orders are all accurate and complete.    [name] Brown Mon, MD  [facility] [phone] (phone) [phone] (fax)  [place]

## 2021-08-01 ENCOUNTER — Ambulatory Visit (INDEPENDENT_AMBULATORY_CARE_PROVIDER_SITE_OTHER): Payer: PPO | Admitting: Obstetrics & Gynecology

## 2021-08-01 ENCOUNTER — Other Ambulatory Visit (HOSPITAL_COMMUNITY)
Admission: RE | Admit: 2021-08-01 | Discharge: 2021-08-01 | Disposition: A | Discharge status: Home / Self Care | Payer: PPO | Source: Ambulatory Visit | Attending: Obstetrics & Gynecology | Admitting: Obstetrics & Gynecology

## 2021-08-01 ENCOUNTER — Other Ambulatory Visit: Payer: Self-pay

## 2021-08-01 ENCOUNTER — Encounter (HOSPITAL_BASED_OUTPATIENT_CLINIC_OR_DEPARTMENT_OTHER): Payer: Self-pay | Admitting: Obstetrics & Gynecology

## 2021-08-01 VITALS — BP 117/71 | HR 101 | Ht 59.25 in | Wt 141.4 lb

## 2021-08-01 DIAGNOSIS — Z90710 Acquired absence of both cervix and uterus: Secondary | ICD-10-CM

## 2021-08-01 DIAGNOSIS — N9089 Other specified noninflammatory disorders of vulva and perineum: Secondary | ICD-10-CM | POA: Diagnosis not present

## 2021-08-01 DIAGNOSIS — Z9189 Other specified personal risk factors, not elsewhere classified: Secondary | ICD-10-CM

## 2021-08-01 DIAGNOSIS — N3010 Interstitial cystitis (chronic) without hematuria: Secondary | ICD-10-CM

## 2021-08-01 DIAGNOSIS — M8580 Other specified disorders of bone density and structure, unspecified site: Secondary | ICD-10-CM

## 2021-08-01 DIAGNOSIS — Z780 Asymptomatic menopausal state: Secondary | ICD-10-CM

## 2021-08-01 DIAGNOSIS — M797 Fibromyalgia: Secondary | ICD-10-CM

## 2021-08-01 MED ORDER — VALACYCLOVIR HCL 500 MG PO TABS: 500.0000 mg | ORAL_TABLET | Freq: Every day | ORAL | 3 refills | Status: DC

## 2021-08-01 NOTE — Progress Notes (Signed)
65 y.o. [id number] Married White or Caucasian female here for breast and pelvic exam.  I am also following her for osteopenia.  There have been a lot of stressors.  Step father passed away this fall.  Now she is doing a lot of care with her mother who is now 70.  Mother just had to have gallbladder surgery.  This has created a lot stressors.  Pt reports she is having some insomnia.  This is especially true is she is with her mother.  Pt would like something for sleep.  She has used xanax in the past.  She has taken trazodone in the past but this gave her palpitations.    Denies vaginal bleeding.  Patient's last menstrual period was 01/28/1997.          Sexually active: No.  H/O STD:  no  Health Maintenance: PCP:  Dr. Rosanna Randy.  Last wellness appt was 07/31/2021.  Did blood work at that appt:  yes Vaccines are up to date:  has not done covid booster Colonoscopy:  08/12/17, follow up 10 years MMG:  01/02/21 BMD:  -1.9, 2020 Last pap smear:  10/14/2016 neg.   H/o abnormal pap smear:  remote hx   reports that she has never smoked. She has been exposed to tobacco smoke. She has never used smokeless tobacco. She reports current alcohol use. She reports that she does not use drugs.  Past Medical History:  Diagnosis Date   Anxiety    Cervical spondylosis without myelopathy    Cervicalgia 01/11/2014   Degeneration of cervical intervertebral disc    Endometriosis    Eosinophilic asthma    Fibromyalgia    Headache(784.0)    High cholesterol    History of fusion of cervical spine 01/11/2014   IC (interstitial cystitis)    Myalgia and myositis, unspecified    Osteoarthrosis, unspecified whether generalized or localized, unspecified site    Osteopenia    Other and unspecified hyperlipidemia    Pain in joint, shoulder region 01/11/2014   Squamous cell carcinoma    [facility]- place on chest    Past Surgical History:  Procedure Laterality Date   BLADDER SUSPENSION  07/2010   With  cystocele repair, Dr. Amalia Hailey   ELBOW SURGERY Right 08/2011   HYSTERECTOMY ABDOMINAL WITH SALPINGO-OOPHORECTOMY  01/1996   SPINAL FUSION  10/2010   C5-C6, C6-C7, Dr. Ellene Route   TONSILLECTOMY  1969    Current Outpatient Medications  Medication Sig Dispense Refill   amitriptyline (ELAVIL) 25 MG tablet TAKE 1 TABLET BY MOUTH AT BEDTIME 90 tablet 0   cyclobenzaprine (FLEXERIL) 10 MG tablet Take 1 tablet (10 mg total) by mouth at bedtime. 30 tablet 11   DULoxetine (CYMBALTA) 60 MG capsule Take 1 capsule (60 mg total) by mouth daily. 90 capsule 3   EUTHYROX 25 MCG tablet TAKE 1 TABLET BY MOUTH ONCE DAILY BEFORE BREAKFAST 90 tablet 0   ezetimibe (ZETIA) 10 MG tablet Take 1 tablet by mouth once daily 90 tablet 0   fluticasone (FLONASE) 50 MCG/ACT nasal spray Place into both nostrils daily.     fluticasone-salmeterol (ADVAIR HFA) 115-21 MCG/ACT inhaler Inhale 2 puffs into the lungs 2 (two) times daily. 1 each 5   Magnesium 500 MG CAPS Take 1 capsule by mouth at bedtime.     Multiple Vitamin (MULTI-VITAMIN) tablet Take 1 tablet by mouth daily.     omeprazole (PRILOSEC) 40 MG capsule Take 40 mg by mouth daily.     polyethylene glycol (MIRALAX /  GLYCOLAX) packet Take 17 g by mouth daily.     psyllium (REGULOID) 0.52 g capsule Take 0.52 g by mouth daily.     UNABLE TO FIND Metronidazole 1% + Ivermectin 1% Apply to face nightly     No current facility-administered medications for this visit.    Family History  Problem Relation Age of Onset   Osteoarthritis Mother    Other Mother        has nipple sparing mastectomies due to "abnormal breast finding"   Heart disease Father    Osteoarthritis Maternal Grandfather        Rheumatoid   Cancer Paternal Grandmother        tongue & throat   Breast cancer Maternal Aunt 68   Melanoma Maternal Aunt 69   Breast cancer Maternal Aunt    Uterine cancer Cousin     Review of Systems  Constitutional:        Insomnia  All other systems reviewed and are  negative.  Exam:   BP 117/71   Pulse (!) 101   Ht 4' 11.25" (1.505 m)   Wt 141 lb 6.4 oz (64.1 kg)   LMP 01/28/1997   BMI 28.32 kg/m   Height: 4' 11.25" (150.5 cm)  General appearance: alert, cooperative and appears stated age Breasts: normal appearance, no masses or tenderness Abdomen: soft, non-tender; bowel sounds normal; no masses,  no organomegaly Lymph nodes: Cervical, supraclavicular, and axillary nodes normal.  No abnormal inguinal nodes palpated Neurologic: Grossly normal  Pelvic: External genitalia:  no lesions              Urethra:  normal appearing urethra with no masses, tenderness or lesions              Bartholins and Skenes: normal                 Vagina: normal appearing vagina with atrophic changes and no discharge, no lesions              Cervix: absent              Pap taken: No. Bimanual Exam:  Uterus:  uterus absent              Adnexa: normal adnexa and no mass, fullness, tenderness               Rectovaginal: Confirms               Anus:  normal sphincter tone, no lesions  Chaperone, Octaviano Batty, CMA, was present for exam.  Assessment/Plan: 1. Encntr for gyn exam (general) (routine) w/o abn findings - pap smear not indicated - MMG 12/2020 - colonoscopy up to date - BMD planned in 2023 - lab work done with Dr. Rosanna Randy - care gaps reviewed/updated  2. Osteopenia, unspecified location - DG BONE DENSITY (DXA); Future  3. Postmenopausal - no HRT  4. History of LAVH  5. Fibromyalgia  6. Chronic interstitial cystitis  7.  Insomnia - pt is going to start taking the full dose of 25mg  amitriptyline at night and see if this helps  8. Vulvar lesion  - hsv testing done today -  valtrex 500mg  daily

## 2021-08-06 LAB — CERVICOVAGINAL ANCILLARY ONLY
Comment: NEGATIVE
HSV1: NEGATIVE
HSV2: NEGATIVE

## 2021-08-08 ENCOUNTER — Other Ambulatory Visit: Payer: Self-pay | Admitting: Family Medicine

## 2021-08-08 DIAGNOSIS — Z1231 Encounter for screening mammogram for malignant neoplasm of breast: Secondary | ICD-10-CM

## 2021-08-26 ENCOUNTER — Other Ambulatory Visit: Payer: Self-pay | Admitting: Family Medicine

## 2021-08-26 DIAGNOSIS — E039 Hypothyroidism, unspecified: Secondary | ICD-10-CM

## 2021-08-27 NOTE — Telephone Encounter (Signed)
Requested Prescriptions  Pending Prescriptions Disp Refills  . levothyroxine (SYNTHROID) 25 MCG tablet [Pharmacy Med Name: Levothyroxine Sodium 25 MCG Oral Tablet] 90 tablet 1    Sig: TAKE 1 TABLET BY MOUTH ONCE DAILY BEFORE BREAKFAST     Endocrinology:  Hypothyroid Agents Failed - 08/26/2021  6:31 AM      Failed - TSH needs to be rechecked within 3 months after an abnormal result. Refill until TSH is due.      Passed - TSH in normal range and within 360 days    TSH  Date Value Ref Range Status  02/14/2021 3.120 0.450 - 4.500 uIU/mL Final         Passed - Valid encounter within last 12 months    Recent Outpatient Visits          3 weeks ago Other hyperlipidemia   [facility] Jerrol Banana., MD   7 months ago Hypothyroidism, unspecified type   [facility] Jerrol Banana., MD   1 year ago Depression, major, in remission [facility])   [facility] Jerrol Banana., MD   1 year ago Supraclavicular mass   [facility] Jerrol Banana., MD   1 year ago [place] Jerrol Banana., MD      Future Appointments            In 5 months Jerrol Banana., MD [facility], [place]

## 2021-09-16 ENCOUNTER — Other Ambulatory Visit: Payer: Self-pay | Admitting: Family Medicine

## 2021-09-16 DIAGNOSIS — R519 Headache, unspecified: Secondary | ICD-10-CM

## 2021-09-16 DIAGNOSIS — R52 Pain, unspecified: Secondary | ICD-10-CM

## 2021-09-16 DIAGNOSIS — E7849 Other hyperlipidemia: Secondary | ICD-10-CM

## 2021-09-16 DIAGNOSIS — M797 Fibromyalgia: Secondary | ICD-10-CM

## 2021-09-17 NOTE — Telephone Encounter (Signed)
Requested Prescriptions  Pending Prescriptions Disp Refills   ezetimibe (ZETIA) 10 MG tablet [Pharmacy Med Name: Ezetimibe 10 MG Oral Tablet] 90 tablet 0    Sig: Take 1 tablet by mouth once daily     Cardiovascular:  Antilipid - Sterol Transport Inhibitors Failed - 09/16/2021  6:31 AM      Failed - Total Cholesterol in normal range and within 360 days    Cholesterol, Total  Date Value Ref Range Status  02/14/2021 217 (H) 100 - 199 mg/dL Final         Failed - LDL in normal range and within 360 days    LDL Chol Calc (NIH)  Date Value Ref Range Status  02/14/2021 138 (H) 0 - 99 mg/dL Final         Passed - HDL in normal range and within 360 days    HDL  Date Value Ref Range Status  02/14/2021 68 >39 mg/dL Final         Passed - Triglycerides in normal range and within 360 days    Triglycerides  Date Value Ref Range Status  02/14/2021 63 0 - 149 mg/dL Final         Passed - Valid encounter within last 12 months    Recent Outpatient Visits          1 month ago Other hyperlipidemia   [facility] Jerrol Banana., MD   7 months ago Hypothyroidism, unspecified type   [facility] Jerrol Banana., MD   1 year ago Depression, major, in remission [facility])   [facility] Jerrol Banana., MD   1 year ago Supraclavicular mass   [facility] Jerrol Banana., MD   1 year ago [place] Jerrol Banana., MD      Future Appointments            In 4 months Jerrol Banana., MD [facility]

## 2021-09-17 NOTE — Telephone Encounter (Signed)
Requested Prescriptions  Pending Prescriptions Disp Refills   amitriptyline (ELAVIL) 25 MG tablet [Pharmacy Med Name: Amitriptyline HCl 25 MG Oral Tablet] 90 tablet 0    Sig: TAKE 1 TABLET BY MOUTH AT BEDTIME     Psychiatry:  Antidepressants - Heterocyclics (TCAs) Passed - 09/16/2021  6:31 AM      Passed - Completed PHQ-2 or PHQ-9 in the last 360 days      Passed - Valid encounter within last 6 months    Recent Outpatient Visits          1 month ago Other hyperlipidemia   [facility] Jerrol Banana., MD   7 months ago Hypothyroidism, unspecified type   [place] Jerrol Banana., MD   1 year ago Depression, major, in remission [facility])   [facility] Jerrol Banana., MD   1 year ago Supraclavicular mass   [facility] Jerrol Banana., MD   1 year ago [place] Jerrol Banana., MD      Future Appointments            In 4 months Jerrol Banana., MD [facility], [place]

## 2021-09-19 DIAGNOSIS — J8283 Eosinophilic asthma: Secondary | ICD-10-CM | POA: Diagnosis not present

## 2021-09-19 DIAGNOSIS — J029 Acute pharyngitis, unspecified: Secondary | ICD-10-CM | POA: Diagnosis not present

## 2021-09-19 DIAGNOSIS — J069 Acute upper respiratory infection, unspecified: Secondary | ICD-10-CM | POA: Diagnosis not present

## 2021-10-16 DIAGNOSIS — J324 Chronic pansinusitis: Secondary | ICD-10-CM | POA: Diagnosis not present

## 2021-10-25 ENCOUNTER — Telehealth: Payer: Self-pay | Admitting: Pulmonary Disease

## 2021-11-19 DIAGNOSIS — H2513 Age-related nuclear cataract, bilateral: Secondary | ICD-10-CM | POA: Diagnosis not present

## 2021-11-19 DIAGNOSIS — H524 Presbyopia: Secondary | ICD-10-CM | POA: Diagnosis not present

## 2021-11-19 DIAGNOSIS — H0288A Meibomian gland dysfunction right eye, upper and lower eyelids: Secondary | ICD-10-CM | POA: Diagnosis not present

## 2021-11-19 DIAGNOSIS — H0288B Meibomian gland dysfunction left eye, upper and lower eyelids: Secondary | ICD-10-CM | POA: Diagnosis not present

## 2021-11-19 DIAGNOSIS — H04123 Dry eye syndrome of bilateral lacrimal glands: Secondary | ICD-10-CM | POA: Diagnosis not present

## 2021-11-21 DIAGNOSIS — M8589 Other specified disorders of bone density and structure, multiple sites: Secondary | ICD-10-CM | POA: Diagnosis not present

## 2021-11-21 DIAGNOSIS — Z780 Asymptomatic menopausal state: Secondary | ICD-10-CM | POA: Diagnosis not present

## 2021-11-21 LAB — HM DEXA SCAN

## 2021-11-22 NOTE — Telephone Encounter (Signed)
Nothing noted in message. Will close encounter.  

## 2021-11-23 ENCOUNTER — Encounter: Payer: Self-pay | Admitting: Physician Assistant

## 2021-11-23 ENCOUNTER — Ambulatory Visit (INDEPENDENT_AMBULATORY_CARE_PROVIDER_SITE_OTHER): Payer: PPO | Admitting: Physician Assistant

## 2021-11-23 ENCOUNTER — Other Ambulatory Visit: Payer: Self-pay

## 2021-11-23 VITALS — BP 105/72 | HR 90 | Temp 98.5°F | Resp 14 | Wt 147.0 lb

## 2021-11-23 DIAGNOSIS — N3010 Interstitial cystitis (chronic) without hematuria: Secondary | ICD-10-CM

## 2021-11-23 DIAGNOSIS — R350 Frequency of micturition: Secondary | ICD-10-CM

## 2021-11-23 LAB — POCT URINALYSIS DIPSTICK
Bilirubin, UA: NEGATIVE
Glucose, UA: NEGATIVE
Ketones, UA: NEGATIVE
Leukocytes, UA: NEGATIVE
Nitrite, UA: NEGATIVE
Protein, UA: NEGATIVE
Spec Grav, UA: 1.01 (ref 1.010–1.025)
Urobilinogen, UA: 0.2 E.U./dL
pH, UA: 6 (ref 5.0–8.0)

## 2021-11-23 NOTE — Progress Notes (Signed)
I,[name],acting as a scribe for [employer], PA-C.,have documented all relevant documentation on the behalf of [name] Elbert E Chattie Greeson, PA-C,as directed by  [employer], PA-C while in the presence of [name], PA-C.   Established patient visit   Patient: [name] Brown   DOB: 08-Jan-1956   66 y.o. Female  MRN: [id number] Visit Date: 11/23/2021  Today's healthcare provider: Dani Gobble Janmarie Smoot, PA-C  Introduced myself to the patient as a Journalist, newspaper and provided education on APPs in clinical practice.    Chief Complaint  Patient presents with   Urinary Frequency   Subjective    Urinary Frequency  This is a new problem. Episode onset: 1 month ago. The problem has been gradually worsening. Associated symptoms include frequency and urgency. Pertinent negatives include no chills, flank pain, hematuria, nausea or vomiting. Associated symptoms comments: Lower abdominal pressure. She has tried nothing for the symptoms.    States she has only had one UTI and this was almost 66 yo She states she had bladder prolapse which was fixed with sling over 10 years ago States she was doing well until the past few months States she has noted increased frequency over the last few months and feels like she has to go all the time Denis pain with urination  States she is having a burning sensation in suprapubic area- reports a discomfort there  Reports that caffeine seems to make the urgency and frequency worse States she has the same sensation in her bladder area and urethra she had when a catheter was placed for her bladder sling.      Medications: Outpatient Medications Prior to Visit  Medication Sig   amitriptyline (ELAVIL) 25 MG tablet TAKE 1 TABLET BY MOUTH AT BEDTIME   cyclobenzaprine (FLEXERIL) 10 MG tablet Take 1 tablet (10 mg total) by mouth at bedtime.   DULoxetine (CYMBALTA) 60 MG capsule Take 1 capsule (60 mg total) by mouth daily.   ezetimibe (ZETIA) 10 MG tablet Take 1 tablet by mouth  once daily   fluticasone (FLONASE) 50 MCG/ACT nasal spray Place into both nostrils daily.   fluticasone-salmeterol (ADVAIR HFA) 115-21 MCG/ACT inhaler Inhale 2 puffs into the lungs 2 (two) times daily.   levothyroxine (SYNTHROID) 25 MCG tablet TAKE 1 TABLET BY MOUTH ONCE DAILY BEFORE BREAKFAST   Magnesium 500 MG CAPS Take 1 capsule by mouth at bedtime.   Multiple Vitamin (MULTI-VITAMIN) tablet Take 1 tablet by mouth daily.   omeprazole (PRILOSEC) 40 MG capsule Take 40 mg by mouth daily.   polyethylene glycol (MIRALAX / GLYCOLAX) packet Take 17 g by mouth daily.   psyllium (REGULOID) 0.52 g capsule Take 0.52 g by mouth daily.   UNABLE TO FIND Metronidazole 1% + Ivermectin 1% Apply to face nightly   valACYclovir (VALTREX) 500 MG tablet Take 1 tablet (500 mg total) by mouth daily. (Patient not taking: Reported on 11/23/2021)   No facility-administered medications prior to visit.    Review of Systems  Constitutional:  Negative for appetite change, chills, fatigue and fever.  Respiratory:  Negative for chest tightness and shortness of breath.   Cardiovascular:  Negative for chest pain and palpitations.  Gastrointestinal:  Negative for abdominal pain, nausea and vomiting.       Lower abdominal pressure  Genitourinary:  Positive for frequency and urgency. Negative for decreased urine volume, difficulty urinating, dyspareunia, dysuria, flank pain, hematuria, vaginal bleeding, vaginal discharge and vaginal pain.       States  she has the sensation that there is something along the left side of her vaginal canal   Neurological:  Negative for dizziness and weakness.      Objective    BP 105/72 (BP Location: Left Arm, Patient Position: Sitting, Cuff Size: Normal)    Pulse 90    Temp 98.5 F (36.9 C) (Oral)    Resp 14    Wt 147 lb (66.7 kg)    LMP 01/28/1997    SpO2 98% Comment: room air   BMI 29.44 kg/m    Physical Exam Vitals reviewed. Exam conducted with a chaperone present.  Constitutional:       Appearance: Normal appearance.  HENT:     Head: Normocephalic and atraumatic.  Pulmonary:     Effort: Pulmonary effort is normal.  Abdominal:     General: Abdomen is flat. Bowel sounds are normal.     Palpations: Abdomen is soft.     Tenderness: There is abdominal tenderness in the suprapubic area. Negative signs include McBurney's sign.     Hernia: No hernia is present. There is no hernia in the umbilical area, ventral area, left inguinal area or right inguinal area.  Genitourinary:    General: Normal vulva.     Pubic Area: No rash or pubic lice.      Tanner stage (genital): 5.     Labia:        Right: No rash.        Left: No rash.      Urethra: No prolapse.     Vagina: Vaginal discharge and tenderness present. No lesions or prolapsed vaginal walls.     Comments: Unable to fully visualize vaginal walls or cervix due to pt discomfort and pain with speculum exam. Did not find evidence of prolapse but exam was limited in nature.  Skin:    General: Skin is warm and dry.  Neurological:     General: No focal deficit present.     Mental Status: She is alert and oriented to person, place, and time.  Psychiatric:        Mood and Affect: Mood normal.        Behavior: Behavior normal.        Thought Content: Thought content normal.        Judgment: Judgment normal.      Results for orders placed or performed in visit on 11/23/21  POCT Urinalysis Dipstick  Result Value Ref Range   Color, UA yelllow    Clarity, UA clear    Glucose, UA Negative Negative   Bilirubin, UA negative    Ketones, UA negative    Spec Grav, UA 1.010 1.010 - 1.025   Blood, UA trace (non hemolyzed)    pH, UA 6.0 5.0 - 8.0   Protein, UA Negative Negative   Urobilinogen, UA 0.2 0.2 or 1.0 E.U./dL   Nitrite, UA negative    Leukocytes, UA Negative Negative   Appearance     Odor      Assessment & Plan     Problem List Items Addressed This Visit       Genitourinary   Chronic interstitial cystitis     Chronic, historic problem Unsure if she is experiencing a flare of symptoms or if her discomfort is a new issue.  Will assess with urine culture, renal US, and urology referral to rule out potential etiologies that could include, UTI, pelvic prolapse/cystocele, malignancy, OAB Follow up in 2 months after evals from OB and [place]  have concluded recommended.         Other   Urinary frequency - Primary    Acute, new problem, progressively worsening Patient reports suprapubic discomfort and burning along with increased urgency and frequency UA was positive for mild RBCs today  Urine culture to rule out UTI - less suspicious of this due to no dysuria or flank pain, and UA results.  Unsure if her symptoms are from chronic interstitial cystitis or another new concern as she has not had pain like this previously with exception of catheter placement.  Will assess with urine culture, renal US, and urology referral to rule out potential etiologies that could include, UTI, pelvic prolapse/cystocele, malignancy, OAB Recommend she return to OB/GYN for exam to rule out prolapse as exam today was not complete due to discomfort Recommend renal US to discern if there is structural cause for pain/ hematuria Will also place referral to Urology for further assistance. If [place] and culture are clear as well as prolapse ruled out, may need to focus efforts toward age-related urinary urgency and treat symptomatically - coordination with urology recommended.   Follow up recommended in approx 2 months.       Relevant Orders   POCT Urinalysis Dipstick (Completed)   Ambulatory referral to Urology   US Renal   Urine Culture     Return in about 2 months (around 01/21/2022) for urinary frequency and urgency.   I, Mahina Salatino E Chanson Teems, PA-C, have reviewed all documentation for this visit. The documentation on 11/23/21 for the exam, diagnosis, procedures, and orders are all accurate and complete.   Maytte Jacot, [place]  [facility] [place], PA-C  [facility] [phone] (phone) [phone] (fax)  [place]

## 2021-11-23 NOTE — Patient Instructions (Addendum)
I would like you to do the following: Make a follow up apt with your OB/GYN office for a better pelvic exam so they can help rule out prolapse.  The referral team should call you to schedule an apt for the Ultrasound and Urology We will keep you updated with the results of the urine culture and ultrasound as they are available.   If you have any of the following symptoms please go to the ED: inability to urinate, fever, severe abdominal pain or cramping, painful mass in your abdomen, mass in the vaginal canal or vulva area.   It was nice to meet you and I appreciate the opportunity to be involved in your care

## 2021-11-23 NOTE — Assessment & Plan Note (Addendum)
Acute, new problem, progressively worsening Patient reports suprapubic discomfort and burning along with increased urgency and frequency UA was positive for mild RBCs today  Urine culture to rule out UTI - less suspicious of this due to no dysuria or flank pain, and UA results.  Unsure if her symptoms are from chronic interstitial cystitis or another new concern as she has not had pain like this previously with exception of catheter placement.  Will assess with urine culture, renal US, and urology referral to rule out potential etiologies that could include, UTI, pelvic prolapse/cystocele, malignancy, OAB Recommend she return to OB/GYN for exam to rule out prolapse as exam today was not complete due to discomfort Recommend renal US to discern if there is structural cause for pain/ hematuria Will also place referral to Urology for further assistance. If [place] and culture are clear as well as prolapse ruled out, may need to focus efforts toward age-related urinary urgency and treat symptomatically - coordination with urology recommended.   Follow up recommended in approx 2 months.

## 2021-11-23 NOTE — Assessment & Plan Note (Signed)
Chronic, historic problem Unsure if she is experiencing a flare of symptoms or if her discomfort is a new issue.  Will assess with urine culture, renal US, and urology referral to rule out potential etiologies that could include, UTI, pelvic prolapse/cystocele, malignancy, OAB Follow up in 2 months after evals from OB and [place] have concluded recommended.

## 2021-11-26 LAB — URINE CULTURE

## 2021-11-27 ENCOUNTER — Encounter (HOSPITAL_BASED_OUTPATIENT_CLINIC_OR_DEPARTMENT_OTHER): Payer: Self-pay | Admitting: Obstetrics & Gynecology

## 2021-11-29 ENCOUNTER — Other Ambulatory Visit: Payer: Self-pay

## 2021-11-29 ENCOUNTER — Ambulatory Visit
Admission: RE | Admit: 2021-11-29 | Discharge: 2021-11-29 | Disposition: A | Discharge status: Home / Self Care | Payer: PPO | Source: Ambulatory Visit | Attending: Physician Assistant | Admitting: Physician Assistant

## 2021-11-29 DIAGNOSIS — R39198 Other difficulties with micturition: Secondary | ICD-10-CM | POA: Diagnosis not present

## 2021-11-29 DIAGNOSIS — R319 Hematuria, unspecified: Secondary | ICD-10-CM | POA: Diagnosis not present

## 2021-11-29 DIAGNOSIS — R350 Frequency of micturition: Secondary | ICD-10-CM | POA: Diagnosis not present

## 2021-12-05 IMAGING — MG DIGITAL SCREENING BILAT W/ TOMO W/ CAD
8 series · 9 of 24 positions shown · non-contrast
Comparison: Previous exam(s).

CLINICAL DATA: Screening.

EXAM:
DIGITAL SCREENING BILATERAL MAMMOGRAM WITH TOMO AND CAD

[R MLO synth-2D]
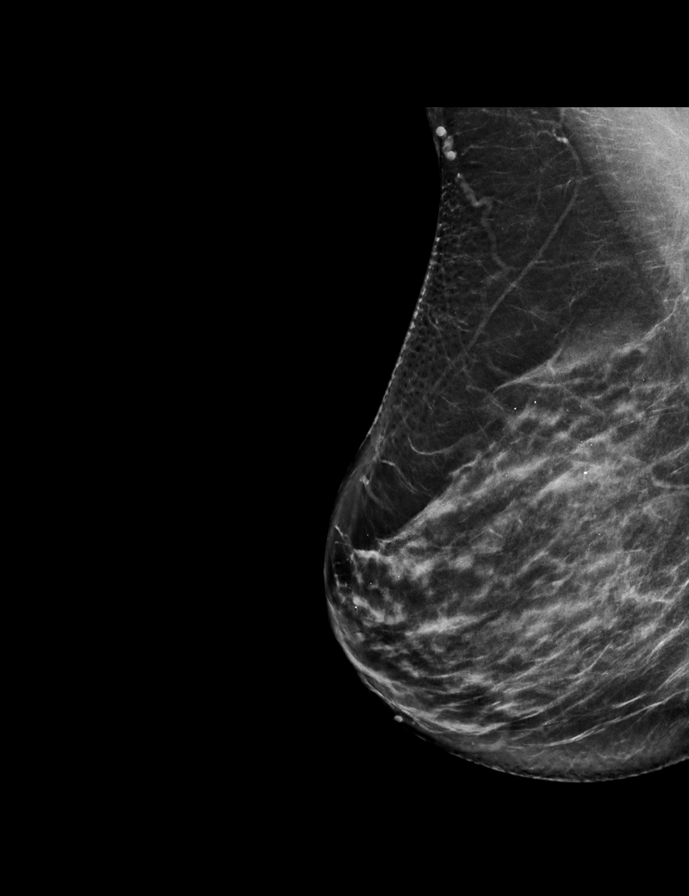

[L CC synth-2D]
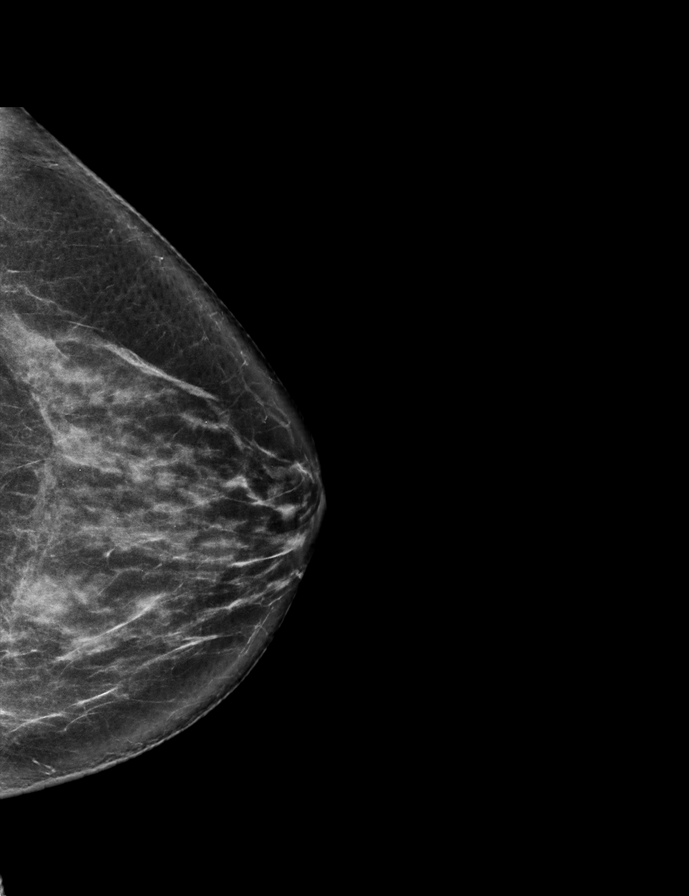

[R CC synth-2D]
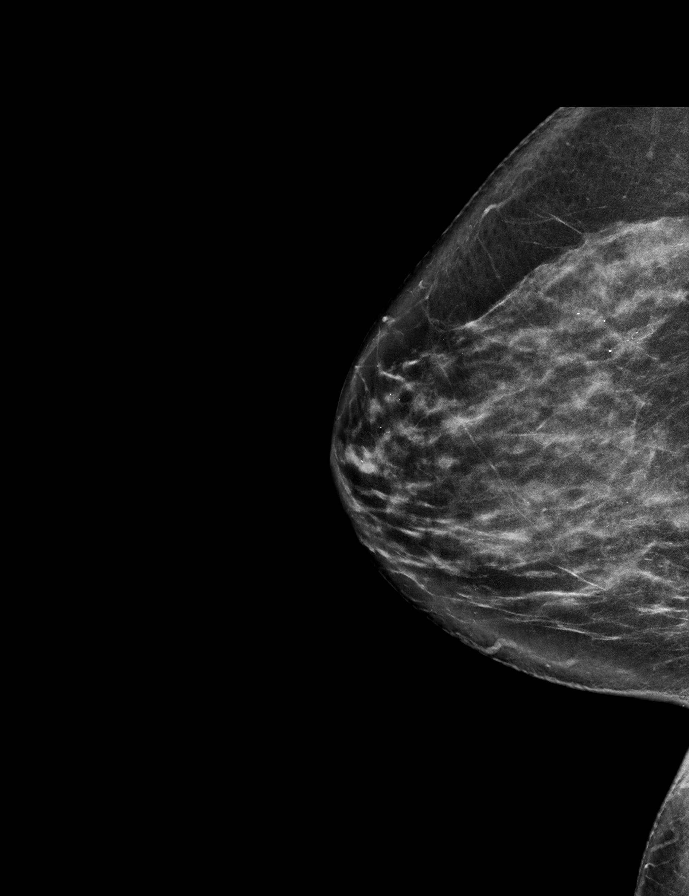

[L MLO synth-2D]
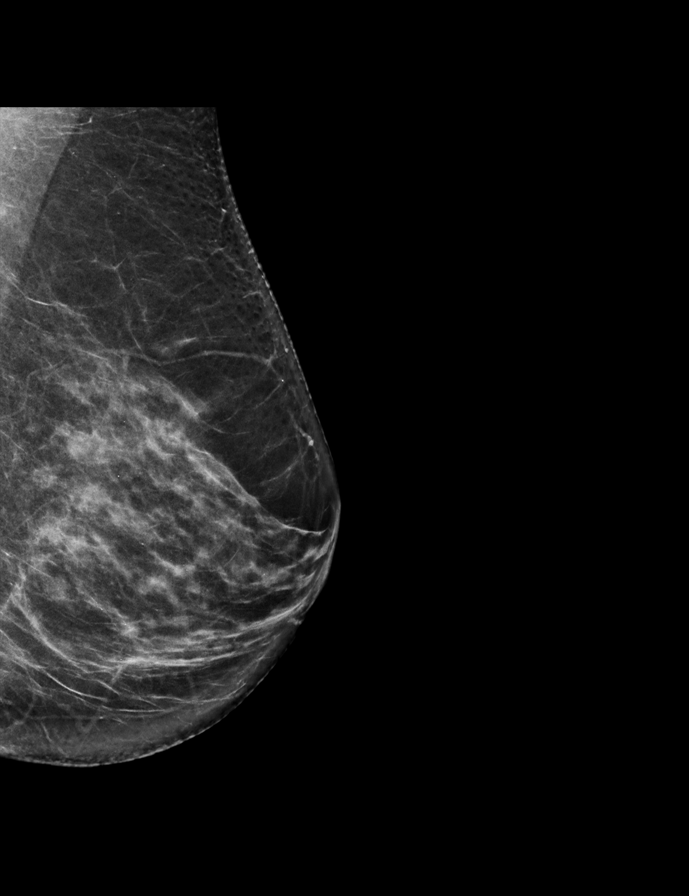

[R CC tomo · 2 of 67 frames shown]
[frame 22/67]
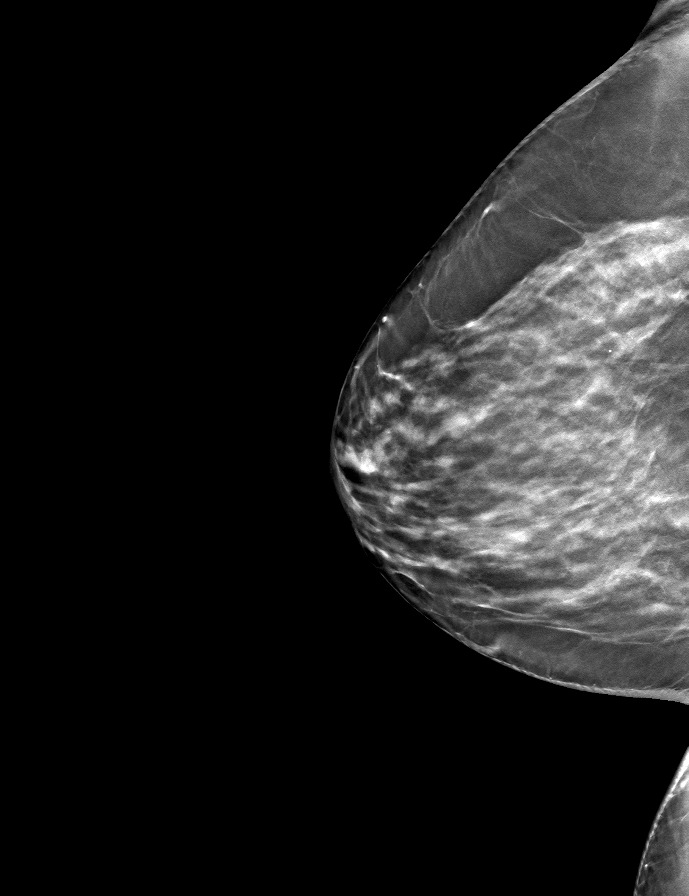
[frame 34/67]
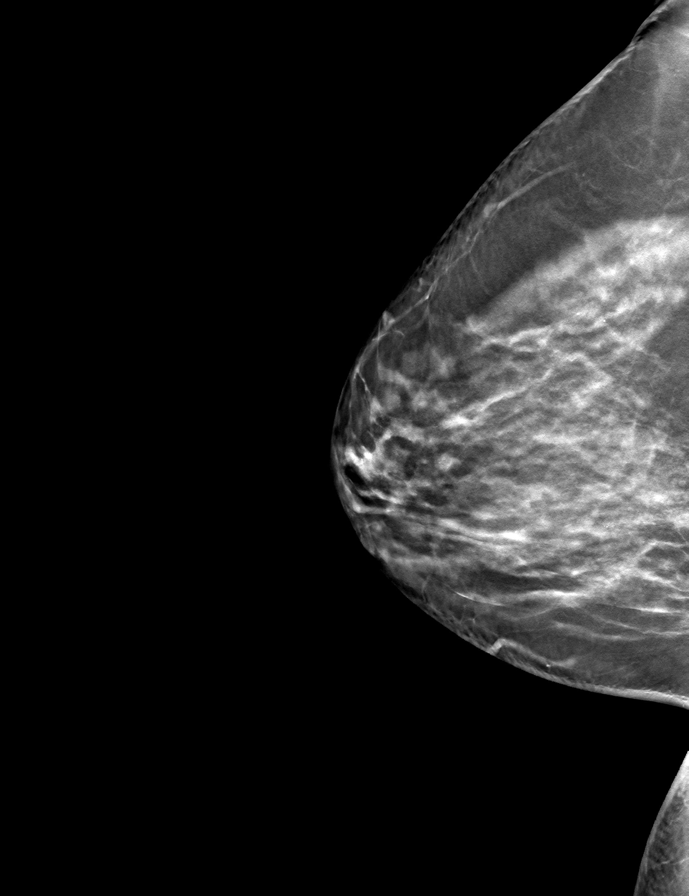

[L MLO tomo · tomo slice 36/71.0]
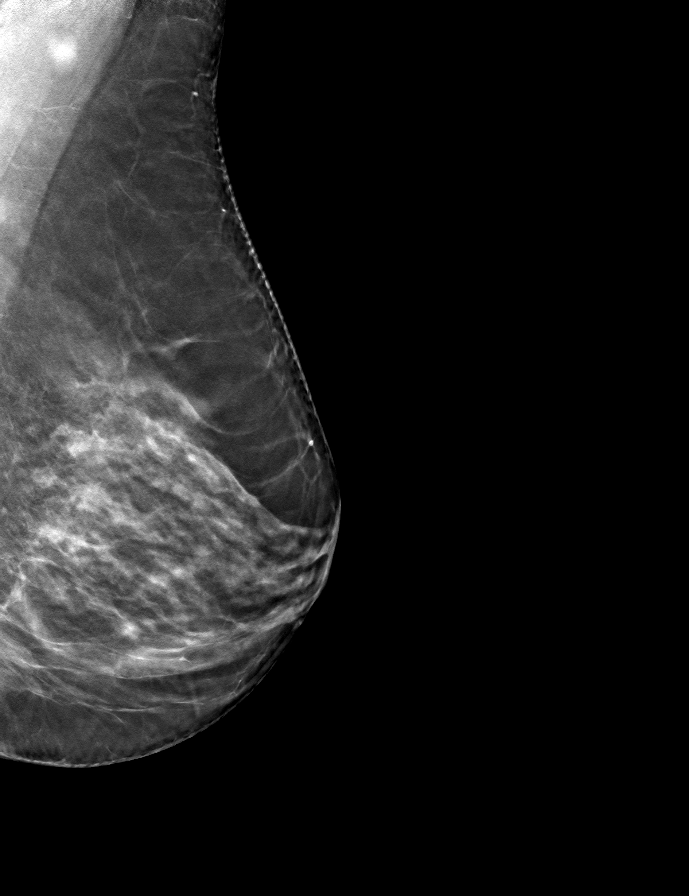

[R MLO tomo · tomo slice 35/70.0]
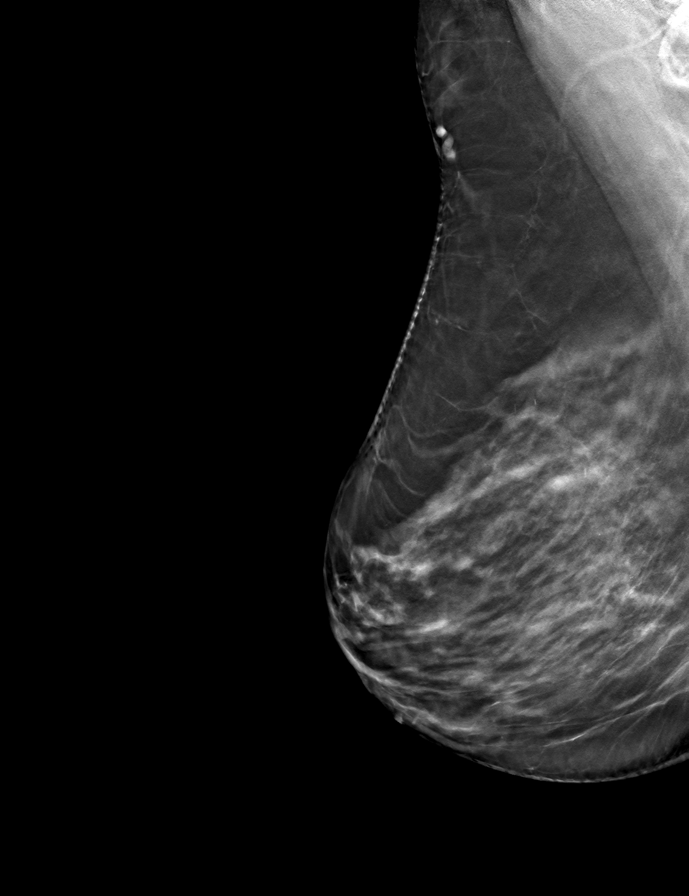

[L CC tomo · tomo slice 35/69.0]
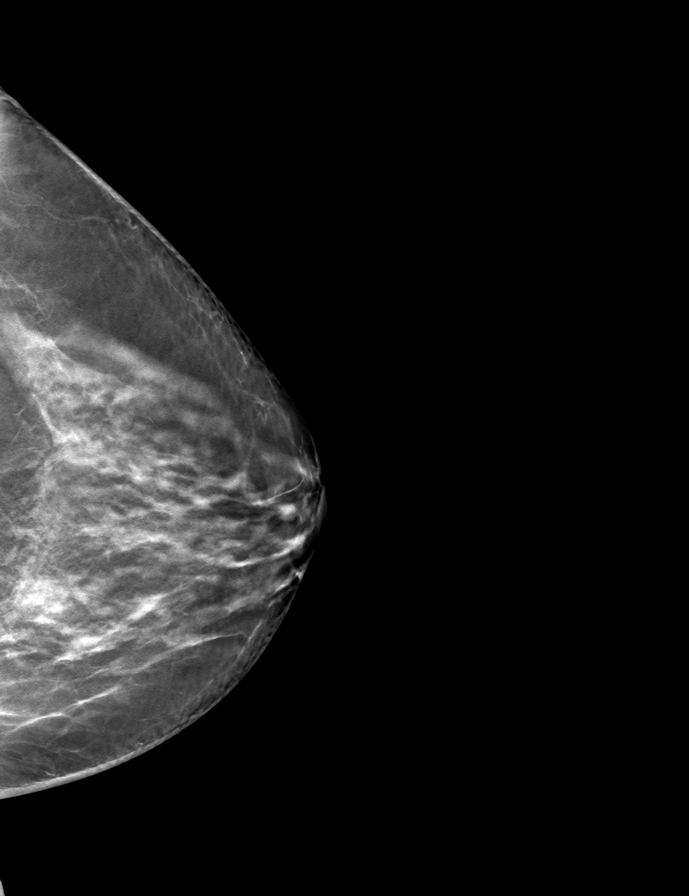

[9 of 24 positions shown; findings below may reference images not displayed]

ACR Breast Density Category c: The breast tissue is heterogeneously
dense, which may obscure small masses.
FINDINGS: There are no findings suspicious for malignancy. Images were
processed with CAD.
IMPRESSION: No mammographic evidence of malignancy. A result letter of this
screening mammogram will be mailed directly to the patient.

RECOMMENDATION:
Screening mammogram in one year. (Code:[id number])

BI-RADS CATEGORY  1: Negative.

## 2021-12-06 ENCOUNTER — Encounter: Payer: Self-pay | Admitting: Pulmonary Disease

## 2021-12-06 ENCOUNTER — Other Ambulatory Visit: Payer: Self-pay

## 2021-12-06 ENCOUNTER — Ambulatory Visit: Payer: PPO | Admitting: Pulmonary Disease

## 2021-12-06 VITALS — BP 118/72 | HR 76 | Temp 98.3°F | Ht 59.0 in | Wt 148.0 lb

## 2021-12-06 DIAGNOSIS — J45991 Cough variant asthma: Secondary | ICD-10-CM | POA: Diagnosis not present

## 2021-12-06 DIAGNOSIS — K219 Gastro-esophageal reflux disease without esophagitis: Secondary | ICD-10-CM

## 2021-12-06 MED ORDER — ALBUTEROL SULFATE HFA 108 (90 BASE) MCG/ACT IN AERS: 2.0000 | INHALATION_SPRAY | Freq: Four times a day (QID) | RESPIRATORY_TRACT | 2 refills | Status: AC | PRN

## 2021-12-06 MED ORDER — ADVAIR HFA 115-21 MCG/ACT IN AERO: 2.0000 | INHALATION_SPRAY | Freq: Two times a day (BID) | RESPIRATORY_TRACT | 12 refills | Status: DC

## 2021-12-06 NOTE — Patient Instructions (Signed)
We are going to order some breathing tests. ? ?I have refilled your medications. ? ?We will see you in follow-up in 3 months time call sooner should any new problems arise. ?

## 2021-12-06 NOTE — Progress Notes (Signed)
Subjective:    Patient ID: [name] Brown, female    DOB: [birth year], 66 y.o.   MRN: [id number]  Patient Care Team: Jerrol Banana., MD as PCP - [facility])  Chief Complaint  Patient presents with   Follow-up   HPI Patient is a 66 year old lifelong never smoker with some second hand exposure in the past who presents for follow-up on the issue of asthma with a very prominent cough variant asthma component.  Last seen here on 07 December 2020 by Geraldo Pitter, NP.  She is not very compliant with follow-ups.  At her last visit she was switched from Flovent to [place] and notes that she has better control of her symptoms with this.  At her prior visit she also had montelukast added however she did not tolerate this medication well because it made her feel "funny".  She was referred to allergy however she does not want to go on allergy shots and did not pursue this.  She has had issues with gastroesophageal reflux however omeprazole was increased primary care and she notes that this is helping her symptoms.  She has not had any fevers, chills or sweats.  Cough has been well controlled with Advair.  No chest pain.  No orthopnea or paroxysmal nocturnal dyspnea.  She does not endorse any other symptomatology today.   Review of Systems A 10 point review of systems was performed and it is as noted above otherwise negative.  Patient Active Problem List   Diagnosis Date Noted   Urinary frequency 11/23/2021   DDD (degenerative disc disease), cervical 10/09/2018   Depression, major, in remission ([place]) 01/06/2018   Hyperlipidemia 06/07/2015   IBS (irritable bowel syndrome) 03/01/2015   Fibromyalgia 03/01/2015   Hypothyroid 03/01/2015   Pain in joint, shoulder region 01/11/2014   Cervicalgia 01/11/2014   History of fusion of cervical spine 01/11/2014   Headache(784.0) 03/29/2013   Chronic interstitial cystitis 11/05/2012   Social History   Tobacco Use   Smoking status:  Never    Passive exposure: Yes   Smokeless tobacco: Never  Substance Use Topics   Alcohol use: Yes    Alcohol/week: 0.0 - 1.0 standard drinks   Allergies  Allergen Reactions   Codeine     hallucination   Hydrocodone    Levofloxacin Other (See Comments)   Minocin [Minocycline]    Nsaids Other (See Comments)    Other reaction(s): OTHER Other reaction(s): OTHER   Current Meds  Medication Sig   amitriptyline (ELAVIL) 25 MG tablet TAKE 1 TABLET BY MOUTH AT BEDTIME   cyclobenzaprine (FLEXERIL) 10 MG tablet Take 1 tablet (10 mg total) by mouth at bedtime.   DULoxetine (CYMBALTA) 60 MG capsule Take 1 capsule (60 mg total) by mouth daily.   ezetimibe (ZETIA) 10 MG tablet Take 1 tablet by mouth once daily   fluticasone (FLONASE) 50 MCG/ACT nasal spray Place into both nostrils daily.   fluticasone-salmeterol (ADVAIR HFA) 115-21 MCG/ACT inhaler Inhale 2 puffs into the lungs 2 (two) times daily.   levothyroxine (SYNTHROID) 25 MCG tablet TAKE 1 TABLET BY MOUTH ONCE DAILY BEFORE BREAKFAST   Magnesium 500 MG CAPS Take 1 capsule by mouth at bedtime.   Multiple Vitamin (MULTI-VITAMIN) tablet Take 1 tablet by mouth daily.   omeprazole (PRILOSEC) 40 MG capsule Take 40 mg by mouth daily.   polyethylene glycol (MIRALAX / GLYCOLAX) packet Take 17 g by mouth daily.   psyllium (REGULOID) 0.52 g capsule Take 0.52 g by  mouth daily.   UNABLE TO FIND Metronidazole 1% + Ivermectin 1% Apply to face nightly   Immunization History  Administered Date(s) Administered   Influenza,inj,Quad PF,6+ Mos 06/07/2015, 06/18/2016, 07/01/2017, 08/03/2018, 07/12/2020   Influenza-Unspecified 07/19/2014, 08/03/2018, 07/30/2021   Moderna Sars-Covid-2 Vaccination 12/22/2019, 01/19/2020, 08/23/2020   Tdap 01/29/2007, 10/14/2016   Zoster Recombinat (Shingrix) 05/04/2020, 10/09/2020       Objective:   Physical Exam BP 118/72 (BP Location: Left Arm, Patient Position: Sitting, Cuff Size: Normal)   Pulse 76   Temp 98.3 F  (36.8 C) (Oral)   Ht '4\' 11"'$  (1.499 m)   Wt 148 lb (67.1 kg)   LMP 01/28/1997   BMI 29.89 kg/m  GENERAL: Awake and alert, no focal deficit.  Fully ambulatory.  No conversational dyspnea HEAD: Normocephalic, atraumatic.  EYES: Pupils equal, round, reactive to light. No scleral icterus.  MOUTH: Nose/mouth/throat not examined due to masking requirements for COVID 19. NECK: Supple. No thyromegaly. No nodules. No JVD. Trachea midline. PULMONARY: Good air entry bilaterally.  No wheezes noted today.   CARDIOVASCULAR: S1 and S2. Regular rate and rhythm.  GASTROINTESTINAL: Nondistended. MUSCULOSKELETAL: No joint swelling, no clubbing, nor edema.  NEUROLOGIC: No focal deficits.  Awake and alert, fluent speech. SKIN: Intact,warm,dry.  No petechiae or rashes on limited exam. PSYCH: Mood and behavior appropriate.       Assessment & Plan:     ICD-10-CM   1. Cough variant asthma  J45.991 Pulmonary Function Test [facility] Only   Continue Advair 115/21, 2 puffs twice a day Continue as needed albuterol Cough controlled PFTs to better characterize    2. Chronic GERD  K21.9    Better controlled on Prilosec 40 mg daily This adds to her cough issues Continue antireflux measures     Meds ordered this encounter  Medications   fluticasone-salmeterol (ADVAIR HFA) 115-21 MCG/ACT inhaler    Sig: Inhale 2 puffs into the lungs 2 (two) times daily.    Dispense:  1 each    Refill:  12   albuterol (VENTOLIN HFA) 108 (90 Base) MCG/ACT inhaler    Sig: Inhale 2 puffs into the lungs every 6 (six) hours as needed for wheezing or shortness of breath.    Dispense:  8 g    Refill:  2   Patient in follow-up in 3 months time she is to contact us prior to that time should any new difficulties arise.  Renold Don, MD Advanced [facility]ch[facility]    *This note was dictated using voice recognition software/Dragon.  Despite best efforts to proofread, errors can occur which can  change the meaning. Any transcriptional errors that result from this process are unintentional and may not be fully corrected at the time of dictation.

## 2021-12-20 ENCOUNTER — Other Ambulatory Visit: Payer: Self-pay | Admitting: Family Medicine

## 2021-12-20 DIAGNOSIS — E7849 Other hyperlipidemia: Secondary | ICD-10-CM

## 2022-01-07 ENCOUNTER — Ambulatory Visit: Payer: PPO | Admitting: Urology

## 2022-01-14 ENCOUNTER — Ambulatory Visit: Payer: PPO

## 2022-01-21 ENCOUNTER — Encounter: Payer: Self-pay | Admitting: Pulmonary Disease

## 2022-01-21 NOTE — Telephone Encounter (Signed)
Dr. Gonzalez, please advise. Thanks 

## 2022-01-22 ENCOUNTER — Other Ambulatory Visit: Payer: Self-pay

## 2022-01-22 MED ORDER — FLUTICASONE-SALMETEROL 250-50 MCG/ACT IN AEPB: 1.0000 | INHALATION_SPRAY | Freq: Two times a day (BID) | RESPIRATORY_TRACT | 6 refills | Status: DC

## 2022-02-04 ENCOUNTER — Ambulatory Visit
Admission: RE | Admit: 2022-02-04 | Discharge: 2022-02-04 | Disposition: A | Discharge status: Home / Self Care | Payer: PPO | Source: Ambulatory Visit | Attending: Family Medicine | Admitting: Family Medicine

## 2022-02-04 DIAGNOSIS — Z1231 Encounter for screening mammogram for malignant neoplasm of breast: Secondary | ICD-10-CM | POA: Diagnosis not present

## 2022-02-06 ENCOUNTER — Ambulatory Visit (INDEPENDENT_AMBULATORY_CARE_PROVIDER_SITE_OTHER): Payer: PPO | Admitting: Family Medicine

## 2022-02-06 ENCOUNTER — Encounter: Payer: Self-pay | Admitting: Family Medicine

## 2022-02-06 VITALS — BP 105/70 | HR 88 | Resp 16 | Ht 60.0 in | Wt 147.0 lb

## 2022-02-06 DIAGNOSIS — F32A Depression, unspecified: Secondary | ICD-10-CM | POA: Diagnosis not present

## 2022-02-06 DIAGNOSIS — M542 Cervicalgia: Secondary | ICD-10-CM | POA: Diagnosis not present

## 2022-02-06 DIAGNOSIS — M797 Fibromyalgia: Secondary | ICD-10-CM

## 2022-02-06 DIAGNOSIS — F325 Major depressive disorder, single episode, in full remission: Secondary | ICD-10-CM

## 2022-02-06 DIAGNOSIS — F324 Major depressive disorder, single episode, in partial remission: Secondary | ICD-10-CM

## 2022-02-06 DIAGNOSIS — N393 Stress incontinence (female) (male): Secondary | ICD-10-CM | POA: Diagnosis not present

## 2022-02-06 DIAGNOSIS — M25519 Pain in unspecified shoulder: Secondary | ICD-10-CM | POA: Diagnosis not present

## 2022-02-06 DIAGNOSIS — E7849 Other hyperlipidemia: Secondary | ICD-10-CM

## 2022-02-06 DIAGNOSIS — R52 Pain, unspecified: Secondary | ICD-10-CM | POA: Diagnosis not present

## 2022-02-06 DIAGNOSIS — E039 Hypothyroidism, unspecified: Secondary | ICD-10-CM

## 2022-02-06 MED ORDER — DULOXETINE HCL 30 MG PO CPEP: 30.0000 mg | ORAL_CAPSULE | Freq: Three times a day (TID) | ORAL | 3 refills | Status: AC

## 2022-02-06 NOTE — Patient Instructions (Signed)
Call or text Marchia Meiers for possible counseling at [phone] ?

## 2022-02-06 NOTE — Progress Notes (Signed)
?  ? ? ?Established patient visit ? ?I,April Miller,acting as a scribe for Wilhemena Durie, MD.,have documented all relevant documentation on the behalf of Wilhemena Durie, MD,as directed by  Wilhemena Durie, MD while in the presence of Wilhemena Durie, MD. ? ? ?Patient: [name] Brown   DOB: 05-Jan-1956   66 y.o. Female  MRN: [id number] ?Visit Date: 02/06/2022 ? ?Today's healthcare provider: Wilhemena Durie, MD  ? ?Chief Complaint  ?Patient presents with  ? Follow-up  ? Hyperlipidemia  ? Depression  ? ?Subjective  ?  ?HPI  ?Patient is in today for routine follow-up but she is absolutely overwhelmed caring for her recently widowed mother.  She wants the patient to spend every day escorting her around to do things. ?Lipid/Cholesterol, Follow-up ? ?Last lipid panel Other pertinent labs  ?Lab Results  ?Component Value Date  ? CHOL 217 (H) 02/14/2021  ? HDL 68 02/14/2021  ? LDLCALC 138 (H) 02/14/2021  ? TRIG 63 02/14/2021  ? CHOLHDL 3.2 02/14/2021  ? Lab Results  ?Component Value Date  ? ALT 16 02/14/2021  ? AST 20 02/14/2021  ? PLT 302 02/14/2021  ? TSH 3.120 02/14/2021  ?  ? ?She was last seen for this 1 years ago.  ?Management since that visit includes; labs checked showing-stable. ? ?The 10-year ASCVD risk score (Arnett DK, et al., 2019) is: 3.6% ? ?--------------------------------------------------------------------------------------------------- ?Hypothyroid, follow-up ? ?Lab Results  ?Component Value Date  ? TSH 3.120 02/14/2021  ? TSH 2.510 10/12/2019  ? TSH 2.120 02/19/2018  ? FREET4 0.88 11/28/2010  ? T4TOTAL 3.6 (L) 11/27/2010  ? ? ?Wt Readings from Last 3 Encounters:  ?02/06/22 147 lb (66.7 kg)  ?12/06/21 148 lb (67.1 kg)  ?11/23/21 147 lb (66.7 kg)  ? ? ?She was last seen for hypothyroid 1 years ago.  ?Management since that visit includes; labs checked-no changes. ? ?----------------------------------------------------------------------------------------- ?Depression, Follow-up ? ?She  was last  seen for this 6 months ago. ?Changes made at last visit include; On duloxetine. ? ? ?  11/23/2021  ?  1:15 PM 08/01/2021  ?  1:17 PM 07/31/2021  ? 11:03 AM  ?Depression screen PHQ 2/9  ?Decreased Interest 0 0 0  ?Down, Depressed, Hopeless 0 0 2  ?PHQ - 2 Score 0 0 2  ?Altered sleeping 1  1  ?Tired, decreased energy 1  2  ?Change in appetite 1  1  ?Feeling bad or failure about yourself  0  0  ?Trouble concentrating 0  0  ?Moving slowly or fidgety/restless 0  0  ?Suicidal thoughts 0  0  ?PHQ-9 Score 3  6  ?Difficult doing work/chores Not difficult at all  Not difficult at all  ?  ?----------------------------------------------------------------------------------------- ? ? ?Medications: ?Outpatient Medications Prior to Visit  ?Medication Sig  ? albuterol (VENTOLIN HFA) 108 (90 Base) MCG/ACT inhaler Inhale 2 puffs into the lungs every 6 (six) hours as needed for wheezing or shortness of breath.  ? amitriptyline (ELAVIL) 25 MG tablet TAKE 1 TABLET BY MOUTH AT BEDTIME  ? cyclobenzaprine (FLEXERIL) 10 MG tablet Take 1 tablet (10 mg total) by mouth at bedtime.  ? DULoxetine (CYMBALTA) 60 MG capsule Take 1 capsule (60 mg total) by mouth daily.  ? ezetimibe (ZETIA) 10 MG tablet Take 1 tablet by mouth once daily  ? fluticasone (FLONASE) 50 MCG/ACT nasal spray Place into both nostrils daily.  ? fluticasone-salmeterol (ADVAIR) 250-50 MCG/ACT AEPB Inhale 1 puff into the lungs in the morning and at  bedtime.  ? levothyroxine (SYNTHROID) 25 MCG tablet TAKE 1 TABLET BY MOUTH ONCE DAILY BEFORE BREAKFAST  ? Magnesium 500 MG CAPS Take 1 capsule by mouth at bedtime.  ? Multiple Vitamin (MULTI-VITAMIN) tablet Take 1 tablet by mouth daily.  ? omeprazole (PRILOSEC) 40 MG capsule Take 40 mg by mouth daily.  ? polyethylene glycol (MIRALAX / GLYCOLAX) packet Take 17 g by mouth daily.  ? psyllium (REGULOID) 0.52 g capsule Take 0.52 g by mouth daily.  ? UNABLE TO FIND Metronidazole 1% + Ivermectin 1% Apply to face nightly  ? ?No  facility-administered medications prior to visit.  ? ? ?Review of Systems  ?Constitutional:  Negative for appetite change, chills, fatigue and fever.  ?Respiratory:  Negative for chest tightness and shortness of breath.   ?Cardiovascular:  Negative for chest pain and palpitations.  ?Gastrointestinal:  Negative for abdominal pain, nausea and vomiting.  ?Neurological:  Negative for dizziness and weakness.  ? ?Last thyroid functions ?Lab Results  ?Component Value Date  ? TSH 3.120 02/14/2021  ? T4TOTAL 3.6 (L) 11/27/2010  ? ?  ?  Objective  ?  ?BP 105/70 (BP Location: Right Arm, Patient Position: Sitting, Cuff Size: Normal)   Pulse 88   Resp 16   Ht 5' (1.524 m)   Wt 147 lb (66.7 kg)   LMP 01/28/1997   SpO2 99%   BMI 28.71 kg/m?  ?BP Readings from Last 3 Encounters:  ?02/06/22 105/70  ?12/06/21 118/72  ?11/23/21 105/72  ? ?Wt Readings from Last 3 Encounters:  ?02/06/22 147 lb (66.7 kg)  ?12/06/21 148 lb (67.1 kg)  ?11/23/21 147 lb (66.7 kg)  ? ?  ? ?Physical Exam ?Vitals reviewed.  ?Constitutional:   ?   Appearance: Normal appearance.  ?HENT:  ?   Head: Normocephalic and atraumatic.  ?   Right Ear: External ear normal.  ?   Left Ear: External ear normal.  ?Eyes:  ?   General: No scleral icterus. ?   Conjunctiva/sclera: Conjunctivae normal.  ?Cardiovascular:  ?   Rate and Rhythm: Normal rate and regular rhythm.  ?   Pulses: Normal pulses.  ?   Heart sounds: Normal heart sounds.  ?Pulmonary:  ?   Effort: Pulmonary effort is normal.  ?   Breath sounds: Normal breath sounds.  ?Musculoskeletal:  ?   Right lower leg: No edema.  ?   Left lower leg: No edema.  ?Skin: ?   General: Skin is warm and dry.  ?Neurological:  ?   General: No focal deficit present.  ?   Mental Status: She is alert and oriented to person, place, and time.  ?Psychiatric:     ?   Mood and Affect: Mood normal.     ?   Behavior: Behavior normal.     ?   Thought Content: Thought content normal.     ?   Judgment: Judgment normal.  ?  ? ? ?No results  found for any visits on 02/06/22. ? Assessment & Plan  ?  ? ?1. Other hyperlipidemia ? ?- Lipid panel ?- TSH ?- CBC w/Diff/Platelet ?- Comprehensive Metabolic Panel (CMET) ? ?2. Hypothyroidism, unspecified type ? ?- Lipid panel ?- TSH ?- CBC w/Diff/Platelet ?- Comprehensive Metabolic Panel (CMET) ? ?3. Depression, major, in remission ([place]) ?Situational depression due to her mother.  Increase Cymbalta from 60 to 90 mg daily.  Follow-up 3 months. ?- Lipid panel ?- TSH ?- CBC w/Diff/Platelet ?- Comprehensive Metabolic Panel (CMET) ?- DULoxetine (CYMBALTA) 30 MG  capsule; Take 1 capsule (30 mg total) by mouth 3 (three) times daily.  Dispense: 270 capsule; Refill: 3 ? ?4. Fibromyalgia ?More than 50% 25 minute visit spent in counseling or coordination of care ? ?- Lipid panel ?- TSH ?- CBC w/Diff/Platelet ?- Comprehensive Metabolic Panel (CMET) ? ?5. Depression ? ?- DULoxetine (CYMBALTA) 30 MG capsule; Take 1 capsule (30 mg total) by mouth 3 (three) times daily.  Dispense: 270 capsule; Refill: 3 ? ?6. Diffuse pain ? ?- DULoxetine (CYMBALTA) 30 MG capsule; Take 1 capsule (30 mg total) by mouth 3 (three) times daily.  Dispense: 270 capsule; Refill: 3 ? ?7. Fibromyalgia muscle pain ? ?- DULoxetine (CYMBALTA) 30 MG capsule; Take 1 capsule (30 mg total) by mouth 3 (three) times daily.  Dispense: 270 capsule; Refill: 3 ? ?8. Pain in joint, shoulder region ? ?- DULoxetine (CYMBALTA) 30 MG capsule; Take 1 capsule (30 mg total) by mouth 3 (three) times daily.  Dispense: 270 capsule; Refill: 3 ? ?9. Cervicalgia ? ? ?10. SI (stress incontinence), female ? ?- DULoxetine (CYMBALTA) 30 MG capsule; Take 1 capsule (30 mg total) by mouth 3 (three) times daily.  Dispense: 270 capsule; Refill: 3 ? ? ?No follow-ups on file.  ?   ? ?I, Wilhemena Durie, MD, have reviewed all documentation for this visit. The documentation on 02/12/22 for the exam, diagnosis, procedures, and orders are all accurate and complete. ? ? ? ?Senica Crall Cranford Mon, MD   ?[facility] ?[phone] (phone) ?[phone] (fax) ? ?[facility] ?

## 2022-02-11 ENCOUNTER — Ambulatory Visit: Payer: PPO | Admitting: Urology

## 2022-02-11 VITALS — BP 110/73 | HR 93 | Ht 59.0 in | Wt 147.0 lb

## 2022-02-11 DIAGNOSIS — N3946 Mixed incontinence: Secondary | ICD-10-CM | POA: Diagnosis not present

## 2022-02-11 DIAGNOSIS — R350 Frequency of micturition: Secondary | ICD-10-CM

## 2022-02-11 LAB — URINALYSIS, COMPLETE
Bilirubin, UA: NEGATIVE
Glucose, UA: NEGATIVE
Ketones, UA: NEGATIVE
Leukocytes,UA: NEGATIVE
Nitrite, UA: NEGATIVE
Protein,UA: NEGATIVE
RBC, UA: NEGATIVE
Specific Gravity, UA: <1.005 — ABNORMAL LOW (ref 1.005–1.030)
Urobilinogen, Ur: 0.2 mg/dL (ref 0.2–1.0)
pH, UA: 6 (ref 5.0–7.5)

## 2022-02-11 LAB — MICROSCOPIC EXAMINATION
Bacteria, UA: NONE SEEN
RBC, Urine: NONE SEEN /hpf (ref 0–2)

## 2022-02-11 NOTE — Progress Notes (Signed)
? ?02/11/2022 ?11:07 AM  ? ?Leeroy Bock ?Dec 13, 1955 ?[id number] ? ?Referring provider: Mecum, Dani Gobble, PA-C ?[id number] ?[place],  [place] [address] ? ?Chief Complaint  ?Patient presents with  ? Urinary Frequency  ? ? ?HPI: ?I was consulted to assess the patient's voiding dysfunction.  She had a sling or prolapse repair approximately 10 years ago.  She currently can leak with coughing sneezing with a full bladder a small amount but not with lifting.  She might have intermittent small-volume urge incontinence.  No bedwetting.  Wears 1 light pad a day.  She does not complain of bulging but wonder if her bladder was dropped ? ?She can void every 20 minutes in the morning then every 2 hours in the afternoon.  She gets up twice at night.  She reports good flow ? ?She has had a hysterectomy and C-spine surgery ? ?Bowel movements normal ? ? ? ? ?PMH: ?Past Medical History:  ?Diagnosis Date  ? Anxiety   ? Cervical spondylosis without myelopathy   ? Cervicalgia 01/11/2014  ? Degeneration of cervical intervertebral disc   ? Endometriosis   ? Eosinophilic asthma   ? Fibromyalgia   ? Headache(784.0)   ? High cholesterol   ? History of fusion of cervical spine 01/11/2014  ? IC (interstitial cystitis)   ? Myalgia and myositis, unspecified   ? Osteoarthrosis, unspecified whether generalized or localized, unspecified site   ? Osteopenia   ? Other and unspecified hyperlipidemia   ? Pain in joint, shoulder region 01/11/2014  ? Squamous cell carcinoma   ? [facility]- place on chest  ? ? ?Surgical History: ?Past Surgical History:  ?Procedure Laterality Date  ? BLADDER SUSPENSION  07/2010  ? With cystocele repair, Dr. Amalia Hailey  ? ELBOW SURGERY Right 08/2011  ? LAPAROSCOPIC ASSISTED VAGINAL HYSTERECTOMY  01/1996  ? wtih BSO  ? SPINAL FUSION  10/2010  ? C5-C6, C6-C7, Dr. Ellene Route  ? TONSILLECTOMY  1969  ? ? ?Home Medications:  ?Allergies as of 02/11/2022   ? ?   Reactions  ? Codeine   ? hallucination  ? Hydrocodone   ?  Levofloxacin Other (See Comments)  ? Minocin [minocycline]   ? Nsaids Other (See Comments)  ? Other reaction(s): OTHER ?Other reaction(s): OTHER  ? ?  ? ?  ?Medication List  ?  ? ?  ? Accurate as of Feb 11, 2022 11:07 AM. If you have any questions, ask your nurse or doctor.  ?  ?  ? ?  ? ?albuterol 108 (90 Base) MCG/ACT inhaler ?Commonly known as: VENTOLIN HFA ?Inhale 2 puffs into the lungs every 6 (six) hours as needed for wheezing or shortness of breath. ?  ?amitriptyline 25 MG tablet ?Commonly known as: ELAVIL ?TAKE 1 TABLET BY MOUTH AT BEDTIME ?  ?cyclobenzaprine 10 MG tablet ?Commonly known as: FLEXERIL ?Take 1 tablet (10 mg total) by mouth at bedtime. ?  ?DULoxetine 30 MG capsule ?Commonly known as: CYMBALTA ?Take 1 capsule (30 mg total) by mouth 3 (three) times daily. ?  ?ezetimibe 10 MG tablet ?Commonly known as: ZETIA ?Take 1 tablet by mouth once daily ?  ?fluticasone 50 MCG/ACT nasal spray ?Commonly known as: FLONASE ?Place into both nostrils daily. ?  ?fluticasone-salmeterol 250-50 MCG/ACT Aepb ?Commonly known as: ADVAIR ?Inhale 1 puff into the lungs in the morning and at bedtime. ?  ?levothyroxine 25 MCG tablet ?Commonly known as: SYNTHROID ?TAKE 1 TABLET BY MOUTH ONCE DAILY BEFORE BREAKFAST ?  ?Magnesium 500 MG  Caps ?Take 1 capsule by mouth at bedtime. ?  ?Multi-Vitamin tablet ?Take 1 tablet by mouth daily. ?  ?omeprazole 40 MG capsule ?Commonly known as: PRILOSEC ?Take 40 mg by mouth daily. ?  ?polyethylene glycol 17 g packet ?Commonly known as: MIRALAX / GLYCOLAX ?Take 17 g by mouth daily. ?  ?psyllium 0.52 g capsule ?Commonly known as: REGULOID ?Take 0.52 g by mouth daily. ?  ?UNABLE TO FIND ?Metronidazole 1% + Ivermectin 1% Apply to face nightly ?  ? ?  ? ? ?Allergies:  ?Allergies  ?Allergen Reactions  ? Codeine   ?  hallucination  ? Hydrocodone   ? Levofloxacin Other (See Comments)  ? Minocin [Minocycline]   ? Nsaids Other (See Comments)  ?  Other reaction(s): OTHER ?Other reaction(s): OTHER   ? ? ?Family History: ?Family History  ?Problem Relation Age of Onset  ? Osteoarthritis Mother   ? Other Mother   ?     has nipple sparing mastectomies due to "abnormal breast finding"  ? Heart disease Father   ? Osteoarthritis Maternal Grandfather   ?     Rheumatoid  ? Cancer Paternal Grandmother   ?     tongue & throat  ? Breast cancer Maternal Aunt 12  ? Melanoma Maternal Aunt 34  ? Breast cancer Maternal Aunt   ? Uterine cancer Cousin   ? ? ?Social History:  reports that she has never smoked. She has been exposed to tobacco smoke. She has never used smokeless tobacco. She reports current alcohol use. She reports that she does not use drugs. ? ?ROS: ?  ? ?  ? ?  ? ?  ? ?  ? ?  ? ?  ? ?  ? ?  ? ?  ? ?  ? ?  ? ?  ? ?Physical Exam: ?BP 110/73   Pulse 93   Ht '4\' 11"'$  (1.499 m)   Wt 66.7 kg   LMP 01/28/1997   BMI 29.69 kg/m?   ?Constitutional:  Alert and oriented, No acute distress. ?HEENT: Chunky AT, moist mucus membranes.  Trachea midline, no masses. ?Cardiovascular: No clubbing, cyanosis, or edema. ?Respiratory: Normal respiratory effort, no increased work of breathing. ?GI: Abdomen is soft, nontender, nondistended, no abdominal masses ?GU: Well supported bladder neck.  Mild suburethral swelling but no diverticulum in my opinion.  Could not feel or see mesh.  Had no prolapse with a very high small grade 1 cystocele.  Moderate atrophy with mild tenderness ?Skin: No rashes, bruises or suspicious lesions. ?Lymph: No cervical or inguinal adenopathy. ?Neurologic: Grossly intact, no focal deficits, moving all 4 extremities. ?Psychiatric: Normal mood and affect. ? ?Laboratory Data: ?Lab Results  ?Component Value Date  ? WBC 7.3 02/14/2021  ? HGB 13.2 02/14/2021  ? HCT 38.9 02/14/2021  ? MCV 88 02/14/2021  ? PLT 302 02/14/2021  ? ? ?Lab Results  ?Component Value Date  ? CREATININE 0.80 02/14/2021  ? ? ?No results found for: PSA ? ?No results found for: TESTOSTERONE ? ?Lab Results  ?Component Value Date  ? HGBA1C (H)  11/27/2010  ?  5.9 ?(NOTE)  According to the ADA Clinical Practice Recommendations for 2011, when HbA1c is used as a screening test:   >=6.5%   Diagnostic of Diabetes Mellitus           (if abnormal result ? is confirmed)  5.7-6.4%   Increased risk of developing Diabetes Mellitus  References:Diagnosis and Classification of Diabetes Mellitus,Diabetes NATF,5732,20(URKYH 1):S62-S69 and Standards of Medical Care in         Diabetes - 2011,Diabetes CWCB,7628,31  ?(Suppl 1):S11-S61.  ? ? ?Urinalysis ?   ?Component Value Date/Time  ? [place] YELLOW 11/27/2010 1230  ? APPEARANCEUR CLEAR 11/27/2010 1230  ? LABSPEC 1.013 11/27/2010 1230  ? PHURINE 7.0 11/27/2010 1230  ? [place] NEGATIVE 11/27/2010 1230  ? BILIRUBINUR negative 11/23/2021 1332  ? [place] NEGATIVE 11/27/2010 1230  ? PROTEINUR Negative 11/23/2021 1332  ? [place] NEGATIVE 11/27/2010 1230  ? UROBILINOGEN 0.2 11/23/2021 1332  ? UROBILINOGEN 0.2 11/27/2010 1230  ? NITRITE negative 11/23/2021 1332  ? NITRITE NEGATIVE 11/27/2010 1230  ? LEUKOCYTESUR Negative 11/23/2021 1332  ? ? ?Pertinent Imaging: ?Urine negative.  Urine sent for culture.  Chart reviewed ? ?Assessment & Plan: Patient has mild mixed incontinence.  She has had previous surgery.  She has frequency especially in the morning.  She has mild nocturia.  Role of treatment versus watchful waiting discussed ? ?Patient shows watchful waiting.  I will see her as needed ? ?1. Urinary frequency ? ?- Urinalysis, Complete ?- CULTURE, URINE COMPREHENSIVE ? ? ?No follow-ups on file. ? ?Reece Packer, MD ? ?[facility] ?[address], [address] ?[place], [place] [address] ?[phone] ?  ?

## 2022-02-14 LAB — CULTURE, URINE COMPREHENSIVE

## 2022-02-19 DIAGNOSIS — E7849 Other hyperlipidemia: Secondary | ICD-10-CM | POA: Diagnosis not present

## 2022-02-19 DIAGNOSIS — M797 Fibromyalgia: Secondary | ICD-10-CM | POA: Diagnosis not present

## 2022-02-19 DIAGNOSIS — F325 Major depressive disorder, single episode, in full remission: Secondary | ICD-10-CM | POA: Diagnosis not present

## 2022-02-19 DIAGNOSIS — E039 Hypothyroidism, unspecified: Secondary | ICD-10-CM | POA: Diagnosis not present

## 2022-02-20 DIAGNOSIS — D2272 Melanocytic nevi of left lower limb, including hip: Secondary | ICD-10-CM | POA: Diagnosis not present

## 2022-02-20 DIAGNOSIS — Z85828 Personal history of other malignant neoplasm of skin: Secondary | ICD-10-CM | POA: Diagnosis not present

## 2022-02-20 DIAGNOSIS — D2261 Melanocytic nevi of right upper limb, including shoulder: Secondary | ICD-10-CM | POA: Diagnosis not present

## 2022-02-20 DIAGNOSIS — B001 Herpesviral vesicular dermatitis: Secondary | ICD-10-CM | POA: Diagnosis not present

## 2022-02-20 DIAGNOSIS — D2262 Melanocytic nevi of left upper limb, including shoulder: Secondary | ICD-10-CM | POA: Diagnosis not present

## 2022-02-20 DIAGNOSIS — L821 Other seborrheic keratosis: Secondary | ICD-10-CM | POA: Diagnosis not present

## 2022-02-20 DIAGNOSIS — L718 Other rosacea: Secondary | ICD-10-CM | POA: Diagnosis not present

## 2022-02-20 LAB — CBC WITH DIFFERENTIAL/PLATELET
Basophils Absolute: 0.1 10*3/uL (ref 0.0–0.2)
Basos: 2 %
EOS (ABSOLUTE): 0.9 10*3/uL — ABNORMAL HIGH (ref 0.0–0.4)
Eos: 13 %
Hematocrit: 39.9 % (ref 34.0–46.6)
Hemoglobin: 13.5 g/dL (ref 11.1–15.9)
Immature Grans (Abs): 0 10*3/uL (ref 0.0–0.1)
Immature Granulocytes: 0 %
Lymphocytes Absolute: 1.9 10*3/uL (ref 0.7–3.1)
Lymphs: 26 %
MCH: 29.7 pg (ref 26.6–33.0)
MCHC: 33.8 g/dL (ref 31.5–35.7)
MCV: 88 fL (ref 79–97)
Monocytes Absolute: 0.5 10*3/uL (ref 0.1–0.9)
Monocytes: 7 %
Neutrophils Absolute: 3.7 10*3/uL (ref 1.4–7.0)
Neutrophils: 52 %
Platelets: 288 10*3/uL (ref 150–450)
RBC: 4.54 x10E6/uL (ref 3.77–5.28)
RDW: 13.2 % (ref 11.7–15.4)
WBC: 7.2 10*3/uL (ref 3.4–10.8)

## 2022-02-20 LAB — COMPREHENSIVE METABOLIC PANEL
ALT: 17 IU/L (ref 0–32)
AST: 18 IU/L (ref 0–40)
Albumin/Globulin Ratio: 1.7 (ref 1.2–2.2)
Albumin: 4.3 g/dL (ref 3.8–4.8)
Alkaline Phosphatase: 79 IU/L (ref 44–121)
BUN/Creatinine Ratio: 23 (ref 12–28)
BUN: 19 mg/dL (ref 8–27)
Bilirubin Total: 0.3 mg/dL (ref 0.0–1.2)
CO2: 25 mmol/L (ref 20–29)
Calcium: 9.4 mg/dL (ref 8.7–10.3)
Chloride: 102 mmol/L (ref 96–106)
Creatinine, Ser: 0.82 mg/dL (ref 0.57–1.00)
Globulin, Total: 2.6 g/dL (ref 1.5–4.5)
Glucose: 99 mg/dL (ref 70–99)
Potassium: 4.5 mmol/L (ref 3.5–5.2)
Sodium: 140 mmol/L (ref 134–144)
Total Protein: 6.9 g/dL (ref 6.0–8.5)
eGFR: 79 mL/min/{1.73_m2} (ref >59)

## 2022-02-20 LAB — TSH: TSH: 4.23 u[IU]/mL (ref 0.450–4.500)

## 2022-02-20 LAB — LIPID PANEL
Chol/HDL Ratio: 3.4 ratio (ref 0.0–4.4)
Cholesterol, Total: 225 mg/dL — ABNORMAL HIGH (ref 100–199)
HDL: 66 mg/dL (ref >39)
LDL Chol Calc (NIH): 142 mg/dL — ABNORMAL HIGH (ref 0–99)
Triglycerides: 96 mg/dL (ref 0–149)
VLDL Cholesterol Cal: 17 mg/dL (ref 5–40)

## 2022-02-23 ENCOUNTER — Other Ambulatory Visit: Payer: Self-pay | Admitting: Family Medicine

## 2022-02-23 DIAGNOSIS — M797 Fibromyalgia: Secondary | ICD-10-CM

## 2022-02-23 DIAGNOSIS — R52 Pain, unspecified: Secondary | ICD-10-CM

## 2022-02-23 DIAGNOSIS — R519 Headache, unspecified: Secondary | ICD-10-CM

## 2022-03-05 ENCOUNTER — Telehealth: Payer: Self-pay | Admitting: Pulmonary Disease

## 2022-03-05 NOTE — Telephone Encounter (Signed)
Spoke to patient and relayed message. She voiced her understanding.  She would like to discuss further at upcoming appt. Nothing further needed.

## 2022-03-05 NOTE — Telephone Encounter (Signed)
[name] Brown when I called Mrs. [name] Brown about scheduling her PFT before her appt with Dr. Patsey Berthold on 03/11/22 she stated she didn't understand why she was ordering the PFT and they can talk about it when she comes in on 03/11/22

## 2022-03-05 NOTE — Telephone Encounter (Signed)
At her initial visit I specified that I was going to get breathing tests so that we could delve into the issues with her cough and potential cough variant asthma.

## 2022-03-05 NOTE — Telephone Encounter (Signed)
Noted.   Routing to Dr. Gonzalez as an FYI.  

## 2022-03-11 ENCOUNTER — Ambulatory Visit: Payer: PPO | Admitting: Pulmonary Disease

## 2022-03-11 ENCOUNTER — Encounter: Payer: Self-pay | Admitting: Pulmonary Disease

## 2022-03-11 VITALS — BP 118/60 | HR 98 | Temp 97.8°F | Ht 59.0 in | Wt 147.0 lb

## 2022-03-11 DIAGNOSIS — D7218 Eosinophilia in diseases classified elsewhere: Secondary | ICD-10-CM

## 2022-03-11 DIAGNOSIS — J302 Other seasonal allergic rhinitis: Secondary | ICD-10-CM | POA: Diagnosis not present

## 2022-03-11 DIAGNOSIS — J45991 Cough variant asthma: Secondary | ICD-10-CM | POA: Diagnosis not present

## 2022-03-11 DIAGNOSIS — J40 Bronchitis, not specified as acute or chronic: Secondary | ICD-10-CM

## 2022-03-11 DIAGNOSIS — J3089 Other allergic rhinitis: Secondary | ICD-10-CM

## 2022-03-11 NOTE — Patient Instructions (Signed)
Continue your inhalers as you are doing.  Zyrtec 10 mg as needed for allergies.  Certainly take it when you are out of town so if you get exposed to something it can help control your symptoms  We will see you in follow-up in 4 months time call sooner should any new problems arise.

## 2022-03-11 NOTE — Progress Notes (Signed)
Subjective:    Patient ID: [name] Brown, female    DOB: 16-Sep-1956, 66 y.o.   MRN: [id number] Patient Care Team: Jerrol Banana., MD as PCP - General [facility])  Chief Complaint  Patient presents with   Follow-up    Breathing is doing well. No current sx.     HPI 66 year old lifelong never smoker who follows here for the issue of cough variant asthma.  06 December 2021 at that time we had requested PFTs, refilled her medications and asked that she follow-up in 3 months.  This is a scheduled visit.  She did not get her PFTs done because she did not understand what these were for though this was explained to her at her prior visit.  Since 07 December 2020 she has been on Advair 250/51 puff twice a day and notes that this has been helpful.  She has not had a recent exacerbation and feels fine according to her.  No fevers, chills or sweats.  No cough or sputum production.  No chest tightness.  No orthopnea or paroxysmal nocturnal dyspnea.  No lower extremity edema or calf tenderness.  Overall she feels well and looks well.   Review of Systems A 10 point review of systems was performed and it is as noted above otherwise negative.  Patient Active Problem List   Diagnosis Date Noted   Urinary frequency 11/23/2021   DDD (degenerative disc disease), cervical 10/09/2018   Depression, major, in remission ([place]) 01/06/2018   Hyperlipidemia 06/07/2015   IBS (irritable bowel syndrome) 03/01/2015   Fibromyalgia 03/01/2015   Hypothyroid 03/01/2015   Pain in joint, shoulder region 01/11/2014   Cervicalgia 01/11/2014   History of fusion of cervical spine 01/11/2014   Headache(784.0) 03/29/2013   Chronic interstitial cystitis 11/05/2012   Social History   Tobacco Use   Smoking status: Never    Passive exposure: Yes   Smokeless tobacco: Never  Substance Use Topics   Alcohol use: Yes    Alcohol/week: 0.0 - 1.0 standard drinks of alcohol   Allergies  Allergen Reactions   Codeine      hallucination   Hydrocodone    Levofloxacin Other (See Comments)   Minocin [Minocycline]    Nsaids Other (See Comments)    Other reaction(s): OTHER Other reaction(s): OTHER   Current Meds  Medication Sig   albuterol (VENTOLIN HFA) 108 (90 Base) MCG/ACT inhaler Inhale 2 puffs into the lungs every 6 (six) hours as needed for wheezing or shortness of breath.   amitriptyline (ELAVIL) 25 MG tablet TAKE 1 TABLET BY MOUTH AT BEDTIME   cyclobenzaprine (FLEXERIL) 10 MG tablet Take 1 tablet (10 mg total) by mouth at bedtime.   DULoxetine (CYMBALTA) 30 MG capsule Take 1 capsule (30 mg total) by mouth 3 (three) times daily.   ezetimibe (ZETIA) 10 MG tablet Take 1 tablet by mouth once daily   fluticasone (FLONASE) 50 MCG/ACT nasal spray Place into both nostrils daily.   fluticasone-salmeterol (ADVAIR) 250-50 MCG/ACT AEPB Inhale 1 puff into the lungs in the morning and at bedtime.   levothyroxine (SYNTHROID) 25 MCG tablet TAKE 1 TABLET BY MOUTH ONCE DAILY BEFORE BREAKFAST   Magnesium 500 MG CAPS Take 1 capsule by mouth at bedtime.   Multiple Vitamin (MULTI-VITAMIN) tablet Take 1 tablet by mouth daily.   omeprazole (PRILOSEC) 40 MG capsule Take 40 mg by mouth daily.   polyethylene glycol (MIRALAX / GLYCOLAX) packet Take 17 g by mouth daily.   psyllium (REGULOID) 0.52  g capsule Take 0.52 g by mouth daily.   UNABLE TO FIND Metronidazole 1% + Ivermectin 1% Apply to face nightly   [DISCONTINUED] valACYclovir (VALTREX) 1000 MG tablet SMARTSIG:2 Tablet(s) By Mouth Every 12 Hours   Immunization History  Administered Date(s) Administered   Influenza,inj,Quad PF,6+ Mos 06/07/2015, 06/18/2016, 07/01/2017, 08/03/2018, 07/12/2020   Influenza-Unspecified 07/19/2014, 08/03/2018, 07/30/2021   Moderna Sars-Covid-2 Vaccination 12/22/2019, 01/19/2020, 08/23/2020   Tdap 01/29/2007, 10/14/2016   Zoster Recombinat (Shingrix) 05/04/2020, 10/09/2020       Objective:   Physical Exam BP 118/60 (BP Location: Left Arm,  Cuff Size: Normal)   Pulse 98   Temp 97.8 F (36.6 C) (Temporal)   Ht '4\' 11"'$  (1.499 m)   Wt 147 lb (66.7 kg)   LMP 01/28/1997   SpO2 96%   BMI 29.69 kg/m  GENERAL: Awake and alert, no focal deficit.  Fully ambulatory. HEAD: Normocephalic, atraumatic.  EYES: Pupils equal, round, reactive to light. No scleral icterus.  MOUTH: Oral mucosa moist, no thrush. NECK: Supple. No thyromegaly. No nodules. No JVD. Trachea midline. PULMONARY: Good air entry bilaterally.  No wheezes noted today.   CARDIOVASCULAR: S1 and S2. Regular rate and rhythm.  GASTROINTESTINAL: Nondistended. MUSCULOSKELETAL: No joint swelling, no clubbing, nor edema.  NEUROLOGIC: No focal deficits.  Awake and alert, fluent speech. SKIN: Intact,warm,dry.  No petechiae or rashes on limited exam. PSYCH: Mood and behavior appropriate.      Assessment & Plan:     ICD-10-CM   1. Cough variant asthma  J45.991 Pulmonary Function Test [facility] Only   Appears well controlled on Advair Continue as needed albuterol PFTs    2. Eosinophilic bronchitis  X10    D72.18    Well-controlled with Advair    3. Perennial allergic rhinitis with seasonal variation  J30.89    J30.2    Take Zyrtec as needed Did not tolerate Singulair     Orders Placed This Encounter  Procedures   Pulmonary Function Test [facility] Only    Standing Status:   Future    Standing Expiration Date:   03/12/2023    Scheduling Instructions:     Within 59mo    Order Specific Question:   Full PFT: includes the following: basic spirometry, spirometry pre & post bronchodilator, diffusion capacity (DLCO), lung volumes    Answer:   Full PFT   We will see the patient in follow-up in 4 months time, will obtain PFTs prior to follow-up.  He is to contact [facility] to that time should any new difficulties arise.   CRenold Don MD Advanced Bronchoscopy [facility]    *This note was dictated using voice recognition software/Dragon.  Despite best  efforts to proofread, errors can occur which can change the meaning. Any transcriptional errors that result from this process are unintentional and may not be fully corrected at the time of dictation.

## 2022-05-20 ENCOUNTER — Other Ambulatory Visit: Payer: Self-pay | Admitting: Family Medicine

## 2022-05-20 DIAGNOSIS — M797 Fibromyalgia: Secondary | ICD-10-CM

## 2022-05-20 DIAGNOSIS — R52 Pain, unspecified: Secondary | ICD-10-CM

## 2022-05-20 DIAGNOSIS — R519 Headache, unspecified: Secondary | ICD-10-CM

## 2022-06-18 ENCOUNTER — Ambulatory Visit (INDEPENDENT_AMBULATORY_CARE_PROVIDER_SITE_OTHER): Payer: PPO | Admitting: Family Medicine

## 2022-06-18 ENCOUNTER — Encounter: Payer: Self-pay | Admitting: Family Medicine

## 2022-06-18 VITALS — BP 121/78 | HR 91 | Resp 14 | Wt 149.0 lb

## 2022-06-18 DIAGNOSIS — M797 Fibromyalgia: Secondary | ICD-10-CM

## 2022-06-18 DIAGNOSIS — F325 Major depressive disorder, single episode, in full remission: Secondary | ICD-10-CM

## 2022-06-18 DIAGNOSIS — H0288A Meibomian gland dysfunction right eye, upper and lower eyelids: Secondary | ICD-10-CM | POA: Diagnosis not present

## 2022-06-18 DIAGNOSIS — H0288B Meibomian gland dysfunction left eye, upper and lower eyelids: Secondary | ICD-10-CM | POA: Diagnosis not present

## 2022-06-18 DIAGNOSIS — E7849 Other hyperlipidemia: Secondary | ICD-10-CM | POA: Diagnosis not present

## 2022-06-18 DIAGNOSIS — M5030 Other cervical disc degeneration, unspecified cervical region: Secondary | ICD-10-CM | POA: Diagnosis not present

## 2022-06-18 DIAGNOSIS — H16143 Punctate keratitis, bilateral: Secondary | ICD-10-CM | POA: Diagnosis not present

## 2022-06-18 NOTE — Progress Notes (Signed)
Established patient visit  I,April Miller,acting as a scribe for Wilhemena Durie, MD.,have documented all relevant documentation on the behalf of Wilhemena Durie, MD,as directed by  Wilhemena Durie, MD while in the presence of Wilhemena Durie, MD.   Patient: [name] Brown   DOB: Oct 07, 1955   66 y.o. Female  MRN: [id number] Visit Date: 06/18/2022  Today's healthcare provider: Wilhemena Durie, MD   Chief Complaint  Patient presents with   Follow-up   Depression   Subjective    HPI  Patient is married and has 2 sons and 8 grandchildren including step grandchildren.  Depression, Follow-up  She  was last seen for this 4 months ago. Changes made at last visit include; Situational depression due to her mother.  Increased Cymbalta from 60 to 90 mg daily.  Follow-up 3 months.      06/18/2022   11:10 AM 11/23/2021    1:15 PM 08/01/2021    1:17 PM  Depression screen PHQ 2/9  Decreased Interest 0 0 0  Down, Depressed, Hopeless 1 0 0  PHQ - 2 Score 1 0 0  Altered sleeping 1 1   Tired, decreased energy 1 1   Change in appetite 1 1   Feeling bad or failure about yourself  0 0   Trouble concentrating 0 0   Moving slowly or fidgety/restless 0 0   Suicidal thoughts 0 0   PHQ-9 Score 4 3   Difficult doing work/chores Not difficult at all Not difficult at all     -----------------------------------------------------------------------------------------   Medications: Outpatient Medications Prior to Visit  Medication Sig   albuterol (VENTOLIN HFA) 108 (90 Base) MCG/ACT inhaler Inhale 2 puffs into the lungs every 6 (six) hours as needed for wheezing or shortness of breath.   amitriptyline (ELAVIL) 25 MG tablet TAKE 1 TABLET BY MOUTH AT BEDTIME   cyclobenzaprine (FLEXERIL) 10 MG tablet Take 1 tablet (10 mg total) by mouth at bedtime.   DULoxetine (CYMBALTA) 30 MG capsule Take 1 capsule (30 mg total) by mouth 3 (three) times daily.   ezetimibe (ZETIA) 10 MG tablet  Take 1 tablet by mouth once daily   fluticasone (FLONASE) 50 MCG/ACT nasal spray Place into both nostrils daily.   fluticasone-salmeterol (ADVAIR) 250-50 MCG/ACT AEPB Inhale 1 puff into the lungs in the morning and at bedtime.   levothyroxine (SYNTHROID) 25 MCG tablet TAKE 1 TABLET BY MOUTH ONCE DAILY BEFORE BREAKFAST   Magnesium 500 MG CAPS Take 1 capsule by mouth at bedtime.   Multiple Vitamin (MULTI-VITAMIN) tablet Take 1 tablet by mouth daily.   omeprazole (PRILOSEC) 40 MG capsule Take 40 mg by mouth daily.   polyethylene glycol (MIRALAX / GLYCOLAX) packet Take 17 g by mouth daily.   psyllium (REGULOID) 0.52 g capsule Take 0.52 g by mouth daily.   UNABLE TO FIND Metronidazole 1% + Ivermectin 1% Apply to face nightly   No facility-administered medications prior to visit.    Review of Systems  Constitutional:  Negative for appetite change, chills, fatigue and fever.  Respiratory:  Negative for chest tightness and shortness of breath.   Cardiovascular:  Negative for chest pain and palpitations.  Gastrointestinal:  Negative for abdominal pain, nausea and vomiting.  Neurological:  Negative for dizziness and weakness.    Last lipids Lab Results  Component Value Date   CHOL 225 (H) 02/19/2022   HDL 66 02/19/2022   LDLCALC 142 (H) 02/19/2022   TRIG 96 02/19/2022  CHOLHDL 3.4 02/19/2022       Objective    BP 121/78 (BP Location: Right Arm, Patient Position: Sitting, Cuff Size: Normal)   Pulse 91   Resp 14   Wt 149 lb (67.6 kg)   LMP 01/28/1997   SpO2 98%   BMI 30.09 kg/m  BP Readings from Last 3 Encounters:  06/18/22 121/78  03/11/22 118/60  02/11/22 110/73   Wt Readings from Last 3 Encounters:  06/18/22 149 lb (67.6 kg)  03/11/22 147 lb (66.7 kg)  02/11/22 147 lb (66.7 kg)      Physical Exam Vitals reviewed.  Constitutional:      General: She is not in acute distress.    Appearance: She is well-developed.  HENT:     Head: Normocephalic and atraumatic.      Right Ear: Hearing normal.     Left Ear: Hearing normal.     Nose: Nose normal.  Eyes:     General: Lids are normal. No scleral icterus.       Right eye: No discharge.        Left eye: No discharge.     Conjunctiva/sclera: Conjunctivae normal.  Cardiovascular:     Rate and Rhythm: Normal rate and regular rhythm.     Heart sounds: Normal heart sounds.  Pulmonary:     Effort: Pulmonary effort is normal. No respiratory distress.  Skin:    Findings: No lesion or rash.  Neurological:     General: No focal deficit present.     Mental Status: She is alert and oriented to person, place, and time.  Psychiatric:        Mood and Affect: Mood normal.        Speech: Speech normal.        Behavior: Behavior normal.        Thought Content: Thought content normal.        Judgment: Judgment normal.       No results found for any visits on 06/18/22.  Assessment & Plan     1. Depression, major, in remission ([place]) Duloxetine and amitriptyline  2. Fibromyalgia muscle pain Continue present meds - cyclobenzaprine (FLEXERIL) 10 MG tablet; Take 1 tablet (10 mg total) by mouth at bedtime.  Dispense: 30 tablet; Refill: 11  3. Other hyperlipidemia  - ezetimibe (ZETIA) 10 MG tablet; Take 1 tablet (10 mg total) by mouth daily.  Dispense: 90 tablet; Refill: 3  4. DDD (degenerative disc disease), cervical   5. Fibromyalgia    No follow-ups on file.      I, Wilhemena Durie, MD, have reviewed all documentation for this visit. The documentation on 06/22/22 for the exam, diagnosis, procedures, and orders are all accurate and complete.    Ziah Leandro Cranford Mon, MD  [facility] [phone] (phone) [phone] (fax)  [place]

## 2022-06-22 MED ORDER — CYCLOBENZAPRINE HCL 10 MG PO TABS: 10.0000 mg | ORAL_TABLET | Freq: Every day | ORAL | 11 refills | Status: AC

## 2022-06-22 MED ORDER — EZETIMIBE 10 MG PO TABS: 10.0000 mg | ORAL_TABLET | Freq: Every day | ORAL | 3 refills | Status: AC

## 2022-07-09 ENCOUNTER — Ambulatory Visit: Payer: PPO | Attending: Pulmonary Disease

## 2022-07-09 DIAGNOSIS — J45991 Cough variant asthma: Secondary | ICD-10-CM | POA: Diagnosis not present

## 2022-07-09 DIAGNOSIS — Z79899 Other long term (current) drug therapy: Secondary | ICD-10-CM | POA: Insufficient documentation

## 2022-07-09 MED ORDER — ALBUTEROL SULFATE (2.5 MG/3ML) 0.083% IN NEBU: 2.5000 mg | INHALATION_SOLUTION | Freq: Once | RESPIRATORY_TRACT | Status: AC

## 2022-07-11 ENCOUNTER — Ambulatory Visit: Payer: PPO | Admitting: Pulmonary Disease

## 2022-07-11 ENCOUNTER — Encounter: Payer: Self-pay | Admitting: Pulmonary Disease

## 2022-07-11 VITALS — BP 110/62 | HR 104 | Temp 98.3°F | Ht 59.0 in | Wt 149.2 lb

## 2022-07-11 DIAGNOSIS — J45991 Cough variant asthma: Secondary | ICD-10-CM

## 2022-07-11 DIAGNOSIS — Z23 Encounter for immunization: Secondary | ICD-10-CM | POA: Diagnosis not present

## 2022-07-11 DIAGNOSIS — D7218 Eosinophilia in diseases classified elsewhere: Secondary | ICD-10-CM

## 2022-07-11 DIAGNOSIS — J3089 Other allergic rhinitis: Secondary | ICD-10-CM

## 2022-07-11 DIAGNOSIS — J302 Other seasonal allergic rhinitis: Secondary | ICD-10-CM

## 2022-07-11 NOTE — Patient Instructions (Signed)
Continue taking your Advair as you are doing.  You received your flu vaccine today.  We will see him in follow-up in 3 months time call sooner should any new problems arise.

## 2022-07-11 NOTE — Progress Notes (Signed)
Subjective:    Patient ID: [name] Brown, female    DOB: Feb 16, 1956, 66 y.o.   MRN: [id number] Patient Care Team: Maple Hudson., MD as PCP - [facility])  Chief Complaint  Patient presents with   Follow-up    No SOB or cough. Occasional wheezing. PFT review.    HPI [name] Brown is a 66 year old lifelong never smoker who follows here for the issue of cough variant asthma.  We last saw her on 11 March 2022 that time she had not followed through with getting PFTs done.  We reordered the PFTs and these were performed 09 July 2022.  The FEV1 was 1.67 L or 86% predicted, FVC 2.03 L or 79% predicted, FEV1/FVC 82%.  There was no postbronchodilator study performed.  Lung volumes were normal, diffusion capacity was normal.  Was indication of small airways component with minimal airway obstruction.  The patient today presents having no issues with shortness of breath or cough.  Notes very rare wheezing on occasion.  She does have seasonal variation with her symptoms.  Has issues with seasonal allergic rhinitis but these have been controlled of late.  Has not required use of rescue inhaler.  She is maintained on Advair 250/50, 1 inhalation twice a day.  She is doing well with this medication.  She does not voice any other complaints today.  No orthopnea or paroxysmal nocturnal dyspnea.  No lower extremity edema or calf tenderness.  No fevers, chills or sweats.  No sputum production or hemoptysis.  She requests flu vaccine today.   Review of Systems A 10 point review of systems was performed and it is as noted above otherwise negative.  Patient Active Problem List   Diagnosis Date Noted   Urinary frequency 11/23/2021   DDD (degenerative disc disease), cervical 10/09/2018   Depression, major, in remission (HCC) 01/06/2018   Hyperlipidemia 06/07/2015   IBS (irritable bowel syndrome) 03/01/2015   Fibromyalgia 03/01/2015   Hypothyroid 03/01/2015   Pain in joint, shoulder region  01/11/2014   Cervicalgia 01/11/2014   History of fusion of cervical spine 01/11/2014   Headache(784.0) 03/29/2013   Chronic interstitial cystitis 11/05/2012   Social History   Tobacco Use   Smoking status: Never    Passive exposure: Yes   Smokeless tobacco: Never  Substance Use Topics   Alcohol use: Yes    Alcohol/week: 0.0 - 1.0 standard drinks of alcohol   Allergies  Allergen Reactions   Codeine     hallucination   Hydrocodone    Levofloxacin Other (See Comments)   Minocin [Minocycline]    Nsaids Other (See Comments)    Other reaction(s): OTHER Other reaction(s): OTHER   Current Meds  Medication Sig   albuterol (VENTOLIN HFA) 108 (90 Base) MCG/ACT inhaler Inhale 2 puffs into the lungs every 6 (six) hours as needed for wheezing or shortness of breath.   amitriptyline (ELAVIL) 25 MG tablet TAKE 1 TABLET BY MOUTH AT BEDTIME   cyclobenzaprine (FLEXERIL) 10 MG tablet Take 1 tablet (10 mg total) by mouth at bedtime.   DULoxetine (CYMBALTA) 30 MG capsule Take 1 capsule (30 mg total) by mouth 3 (three) times daily.   DULoxetine (CYMBALTA) 60 MG capsule Take 1 capsule by mouth daily.   ezetimibe (ZETIA) 10 MG tablet Take 1 tablet (10 mg total) by mouth daily.   fluticasone (FLONASE) 50 MCG/ACT nasal spray Place into both nostrils daily.   fluticasone-salmeterol (ADVAIR) 250-50 MCG/ACT AEPB Inhale 1 puff into the lungs in  the morning and at bedtime.   levothyroxine (SYNTHROID) 25 MCG tablet TAKE 1 TABLET BY MOUTH ONCE DAILY BEFORE BREAKFAST   Magnesium 500 MG CAPS Take 1 capsule by mouth at bedtime.   Multiple Vitamin (MULTI-VITAMIN) tablet Take 1 tablet by mouth daily.   omeprazole (PRILOSEC) 40 MG capsule Take 40 mg by mouth daily.   polyethylene glycol (MIRALAX / GLYCOLAX) packet Take 17 g by mouth daily.   psyllium (REGULOID) 0.52 g capsule Take 0.52 g by mouth daily.   UNABLE TO FIND Metronidazole 1% + Ivermectin 1% Apply to face nightly   Immunization History  Administered  Date(s) Administered   Influenza,inj,Quad PF,6+ Mos 06/07/2015, 06/18/2016, 07/01/2017, 08/03/2018, 07/12/2020   Influenza-Unspecified 07/19/2014, 08/03/2018, 07/30/2021   Moderna Sars-Covid-2 Vaccination 12/22/2019, 01/19/2020, 08/23/2020   Tdap 01/29/2007, 10/14/2016   Zoster Recombinat (Shingrix) 05/04/2020, 10/09/2020       Objective:   Physical Exam BP 110/62 (BP Location: Left Arm, Cuff Size: Normal)   Pulse (!) 104   Temp 98.3 F (36.8 C)   Ht  (1.499 m)   Wt 149 lb 3.2 oz (67.7 kg)   LMP 01/28/1997   SpO2 94%   BMI 30.13 kg/m  GENERAL: Awake and alert, no focal deficit.  Fully ambulatory. HEAD: Normocephalic, atraumatic.  EYES: Pupils equal, round, reactive to light. No scleral icterus.  MOUTH: Oral mucosa moist, no thrush. NECK: Supple. No thyromegaly. No nodules. No JVD. Trachea midline. PULMONARY: Good air entry bilaterally.  No wheezes noted today.   CARDIOVASCULAR: S1 and S2. Regular rate and rhythm.  GASTROINTESTINAL: Nondistended. MUSCULOSKELETAL: No joint swelling, no clubbing, nor edema.  NEUROLOGIC: No focal deficits.  Awake and alert, fluent speech. SKIN: Intact,warm,dry.  No petechiae or rashes on limited exam. PSYCH: Mood and behavior appropriate.      Assessment & Plan:     ICD-10-CM   1. Cough variant asthma  J45.991    Continue Advair as she is doing Continue as needed albuterol    2. Eosinophilic bronchitis  J40    D72.18    Quiescent with Advair    3. Perennial allergic rhinitis with seasonal variation  J30.89    J30.2    Continue Flonase Continue nasal hygiene    4. Need for immunization against influenza  Z23 Flu Vaccine QUAD High Dose(Fluad)   Patient received flu vaccine today     Orders Placed This Encounter  Procedures   Flu Vaccine QUAD High Dose(Fluad)   Will see the patient in follow-up in 3 months time she is to contact us prior to that time should any new difficulties arise.  Gailen Shelter, MD Advanced  Bronchoscopy P[facility]    *This note was dictated using voice recognition software/Dragon.  Despite best efforts to proofread, errors can occur which can change the meaning. Any transcriptional errors that result from this process are unintentional and may not be fully corrected at the time of dictation.

## 2022-08-05 ENCOUNTER — Encounter (HOSPITAL_BASED_OUTPATIENT_CLINIC_OR_DEPARTMENT_OTHER): Payer: Self-pay | Admitting: Obstetrics & Gynecology

## 2022-08-05 ENCOUNTER — Ambulatory Visit (HOSPITAL_BASED_OUTPATIENT_CLINIC_OR_DEPARTMENT_OTHER): Payer: PPO | Admitting: Obstetrics & Gynecology

## 2022-08-05 VITALS — BP 106/60 | HR 94 | Ht 59.5 in | Wt 148.8 lb

## 2022-08-05 DIAGNOSIS — N644 Mastodynia: Secondary | ICD-10-CM | POA: Diagnosis not present

## 2022-08-05 DIAGNOSIS — Z803 Family history of malignant neoplasm of breast: Secondary | ICD-10-CM | POA: Diagnosis not present

## 2022-08-05 NOTE — Progress Notes (Signed)
GYNECOLOGY  VISIT  CC:   discuss breast pain, family history of breast cancer  HPI: 66 y.o. G98P2012 Married White or Caucasian female here for discussion of breast pain that has been present for several months.  It is diffuse.  No recent breast trauma.  No bruising.  No changes in exercising.  Breasts feel heavy to her.  She has gained some weight in the past few years and knows this can contribute to her symptoms.  There is breast cancer in her family and possibly a cousin who had positive genetic testing.  Pt has called family members before coming to the appointment today but she is not 100% sure about this.    Mother had hx of nipple sparing mastectomy in her 73's.  She is now in her 62's.  Saline implants were placed with the original surgery but ultimately removed due to leaking.  She did not have an official diagnosis of breast cancer.    Tyrer Cusick model done today with 10.0% lifetime risk, compared to 5.9% lifetime risk for aged matched women.  She is interested in pursuing genetic testing.   Past Medical History:  Diagnosis Date   Anxiety    Cervical spondylosis without myelopathy    Cervicalgia 01/11/2014   Degeneration of cervical intervertebral disc    Endometriosis    Eosinophilic asthma    Fibromyalgia    Headache(784.0)    High cholesterol    History of fusion of cervical spine 01/11/2014   IC (interstitial cystitis)    Myalgia and myositis, unspecified    Osteoarthrosis, unspecified whether generalized or localized, unspecified site    Osteopenia    Other and unspecified hyperlipidemia    Pain in joint, shoulder region 01/11/2014   Squamous cell carcinoma    [facility]- place on chest    MEDS:   Current Outpatient Medications on File Prior to Visit  Medication Sig Dispense Refill   albuterol (VENTOLIN HFA) 108 (90 Base) MCG/ACT inhaler Inhale 2 puffs into the lungs every 6 (six) hours as needed for wheezing or shortness of breath. 8 g 2   amitriptyline  (ELAVIL) 25 MG tablet TAKE 1 TABLET BY MOUTH AT BEDTIME 90 tablet 0   cyclobenzaprine (FLEXERIL) 10 MG tablet Take 1 tablet (10 mg total) by mouth at bedtime. 30 tablet 11   DULoxetine (CYMBALTA) 30 MG capsule Take 1 capsule (30 mg total) by mouth 3 (three) times daily. 270 capsule 3   DULoxetine (CYMBALTA) 60 MG capsule Take 1 capsule by mouth daily.     ezetimibe (ZETIA) 10 MG tablet Take 1 tablet (10 mg total) by mouth daily. 90 tablet 3   fluticasone (FLONASE) 50 MCG/ACT nasal spray Place into both nostrils daily.     fluticasone-salmeterol (ADVAIR) 250-50 MCG/ACT AEPB Inhale 1 puff into the lungs in the morning and at bedtime. 60 each 6   levothyroxine (SYNTHROID) 25 MCG tablet TAKE 1 TABLET BY MOUTH ONCE DAILY BEFORE BREAKFAST 90 tablet 1   Magnesium 500 MG CAPS Take 1 capsule by mouth at bedtime.     Multiple Vitamin (MULTI-VITAMIN) tablet Take 1 tablet by mouth daily.     omeprazole (PRILOSEC) 40 MG capsule Take 40 mg by mouth daily.     polyethylene glycol (MIRALAX / GLYCOLAX) packet Take 17 g by mouth daily.     psyllium (REGULOID) 0.52 g capsule Take 0.52 g by mouth daily.     UNABLE TO FIND Metronidazole 1% + Ivermectin 1% Apply to face nightly  Current Facility-Administered Medications on File Prior to Visit  Medication Dose Route Frequency Provider Last Rate Last Admin   albuterol (PROVENTIL) (2.5 MG/3ML) 0.083% nebulizer solution 2.5 mg  2.5 mg Nebulization Once Tyler Pita, MD        ALLERGIES: Codeine, Hydrocodone, Levofloxacin, Minocin [minocycline], and Nsaids  SH:  married, non smoker  Review of Systems  Constitutional: Negative.     PHYSICAL EXAMINATION:    BP 106/60 (BP Location: Left Arm, Patient Position: Sitting, Cuff Size: Large)   Pulse 94   Ht 4' 11.5" (1.511 m) Comment: reported  Wt 148 lb 12.8 oz (67.5 kg)   LMP 01/28/1997   BMI 29.55 kg/m     General appearance: alert, cooperative and appears stated age Neck: no adenopathy, supple,  symmetrical, trachea midline and thyroid normal to inspection and palpation CV:  Regular rate and rhythm Lungs:  clear to auscultation, no wheezes, rales or rhonchi, symmetric air entry Breasts: normal appearance, no masses or tenderness Lymph:  no axillary LAD   Chaperone, Octaviano Batty, CMA, was present for exam.  Assessment/Plan: 1. Breast pain - will proceed with diagnostic imaging - US BREAST LTD UNI LEFT INC AXILLA; Future - MM DIAG BREAST TOMO BILATERAL; Future - US BREAST LTD UNI RIGHT INC AXILLA; Future  2. Family history of breast cancer - will refer for consideration of genetic testing - Ambulatory referral to [facility]

## 2022-08-06 ENCOUNTER — Telehealth: Payer: Self-pay | Admitting: Genetic Counselor

## 2022-08-06 NOTE — Telephone Encounter (Signed)
Scheduled appointment per 11/07 referral. Patient is aware of appointment date and time. Patient is aware to arrive 15 mins prior to appointment time and to bring updated insurance cards. Patient is aware of location.   

## 2022-09-02 ENCOUNTER — Other Ambulatory Visit: Payer: Self-pay | Admitting: Genetic Counselor

## 2022-09-02 DIAGNOSIS — Z803 Family history of malignant neoplasm of breast: Secondary | ICD-10-CM

## 2022-09-03 ENCOUNTER — Inpatient Hospital Stay: Payer: PPO | Attending: Genetic Counselor | Admitting: Genetic Counselor

## 2022-09-03 ENCOUNTER — Inpatient Hospital Stay: Payer: PPO

## 2022-09-03 ENCOUNTER — Other Ambulatory Visit: Payer: Self-pay

## 2022-09-03 DIAGNOSIS — Z8049 Family history of malignant neoplasm of other genital organs: Secondary | ICD-10-CM | POA: Diagnosis not present

## 2022-09-03 DIAGNOSIS — Z803 Family history of malignant neoplasm of breast: Secondary | ICD-10-CM | POA: Diagnosis not present

## 2022-09-03 DIAGNOSIS — Z1509 Genetic susceptibility to other malignant neoplasm: Secondary | ICD-10-CM

## 2022-09-03 DIAGNOSIS — Z1501 Genetic susceptibility to malignant neoplasm of breast: Secondary | ICD-10-CM

## 2022-09-03 DIAGNOSIS — Z800 Family history of malignant neoplasm of digestive organs: Secondary | ICD-10-CM

## 2022-09-03 LAB — GENETIC SCREENING ORDER

## 2022-09-04 ENCOUNTER — Encounter: Payer: Self-pay | Admitting: Genetic Counselor

## 2022-09-04 DIAGNOSIS — Z803 Family history of malignant neoplasm of breast: Secondary | ICD-10-CM | POA: Insufficient documentation

## 2022-09-04 DIAGNOSIS — Z8049 Family history of malignant neoplasm of other genital organs: Secondary | ICD-10-CM | POA: Insufficient documentation

## 2022-09-04 DIAGNOSIS — Z800 Family history of malignant neoplasm of digestive organs: Secondary | ICD-10-CM | POA: Insufficient documentation

## 2022-09-04 NOTE — Progress Notes (Signed)
REFERRING PROVIDER: Megan Salon, MD [address] [place],  [place] [address]  PRIMARY PROVIDER:  Jerrol Brown., MD  PRIMARY REASON FOR VISIT:  1. Family history of breast cancer   2. Family history of uterine cancer   3. Family history of colon cancer      HISTORY OF PRESENT ILLNESS:   [name] Brown, a 66 y.o. female, was seen for a [name] Brown cancer genetics consultation at the request of Dr. Sabra Brown due to a family history of cancer.  [name] Brown presents to clinic today to discuss the possibility of a hereditary predisposition to cancer, genetic testing, and to further clarify her future cancer risks, as well as potential cancer risks for family members.    [name] Brown is a 66 y.o. female with no personal history of cancer.    CANCER HISTORY:  Oncology History   No history exists.     RISK FACTORS:  Menarche was at age 34.  First live birth at age 86.  OCP use for approximately 0 years.  Ovaries intact: no.  Hysterectomy: yes.  Menopausal status: postmenopausal.  HRT use: 10 years. Colonoscopy: yes; normal. Mammogram within the last year: yes. Number of breast biopsies: 0. Up to date with pelvic exams: yes. Any excessive radiation exposure in the past: no  Past Medical History:  Diagnosis Date   Anxiety    Cervical spondylosis without myelopathy    Cervicalgia 01/11/2014   Degeneration of cervical intervertebral disc    Endometriosis    Eosinophilic asthma    Family history of breast cancer    Family history of colon cancer    Family history of uterine cancer    Fibromyalgia    Headache(784.0)    High cholesterol    History of fusion of cervical spine 01/11/2014   IC (interstitial cystitis)    Myalgia and myositis, unspecified    Osteoarthrosis, unspecified whether generalized or localized, unspecified site    Osteopenia    Other and unspecified hyperlipidemia    Pain in joint, shoulder region 01/11/2014   Squamous cell carcinoma     [facility]- place on chest    Past Surgical History:  Procedure Laterality Date   BLADDER SUSPENSION  07/2010   With cystocele repair, Dr. Amalia Brown   ELBOW SURGERY Right 08/2011   LAPAROSCOPIC ASSISTED VAGINAL HYSTERECTOMY  01/1996   wtih BSO   SPINAL FUSION  10/2010   C5-C6, C6-C7, Dr. Ellene Brown   TONSILLECTOMY  1969    Social History   Socioeconomic History   Marital status: Married    Spouse name: [name] Brown   Number of children: 2   Years of education: [employer] education level: Not on file  Occupational History   Not on file  Tobacco Use   Smoking status: Never    Passive exposure: Yes   Smokeless tobacco: Never  Vaping Use   Vaping Use: Never used  Substance and Sexual Activity   Alcohol use: Yes    Alcohol/week: 0.0 - 1.0 standard drinks of alcohol   Drug use: No   Sexual activity: Not Currently    Partners: Male    Birth control/protection: Surgical    Comment: LAVH  Other Topics Concern   Not on file  Social History Narrative   Patient is married IT sales professional) and lives at home with her husband.   Patient has a Transport planner.   Patient drinks one can of Diet Dr. Malachi Brown daily.   Patient has  a college education.   Patient is right- handed.   Patient has two children and two step-children.   Social Determinants of Health   Financial Resource Strain: Not on file  Food Insecurity: Not on file  Transportation Needs: Not on file  Physical Activity: Not on file  Stress: Not on file  Social Connections: Not on file     FAMILY HISTORY:  We obtained a detailed, 4-generation family history.  Significant diagnoses are listed below: Family History  Problem Relation Age of Onset   Breast cancer Mother 17       double mast   Osteoarthritis Mother    Other Mother        has nipple sparing mastectomies due to "abnormal breast finding"   Heart disease Father    Breast cancer Maternal Aunt 68   Melanoma Maternal Aunt 69   Breast cancer Maternal Aunt  58   Liver cancer Maternal Uncle    Stomach cancer Maternal Grandmother    Osteoarthritis Maternal Grandfather        Rheumatoid   Colon cancer Maternal Grandfather    Cancer Paternal Grandmother        tongue & throat   Uterine cancer Cousin        The patient has two sons who are cancer free.  She has one brother who is cancer free.  Her father is deceased and her mother is living.  The patient's father died at 54.  He is an only child.  His parents are deceased.  His father died at 37 and his mother died of a head/neck cancer.  The patient's mother had breast cancer at 77 and had a double mastectomy. She had four brothers and three sisters.  One brother died of COPD, one died of liver cancer, another from alcohol problems and the fourth from polio as a child.  One sister had breast cancer at 5 and melanoma at 48.  She had a daughter with uterine cancer at 83.  Another sister had breast cancer at 14 and again at 98.  The maternal grandparents died of cancer,  The grandmother had stomach cancer and the grandfather had colon cancer.  [name] Brown is unaware of previous family history of genetic testing for hereditary cancer risks. Patient's maternal ancestors are of [place] descent, and paternal ancestors are of [place] and [place] descent. There is no reported Ashkenazi Jewish ancestry. There is no known consanguinity.  GENETIC COUNSELING ASSESSMENT: [name] Brown is a 66 y.o. female with a family history of cancer which is somewhat suggestive of a hereditary cancer syndrome and predisposition to cancer given the young ages of onset of breast cancer in her mother and the number of family members with related cancers. We, therefore, discussed and recommended the following at today's visit.   DISCUSSION: We discussed that, in general, most cancer is not inherited in families, but instead is sporadic or familial. Sporadic cancers occur by chance and typically happen at older ages (>50  years) as this type of cancer is caused by genetic changes acquired during an individual's lifetime. Some families have more cancers than would be expected by chance; however, the ages or types of cancer are not consistent with a known genetic mutation or known genetic mutations have been ruled out. This type of familial cancer is thought to be due to a combination of multiple genetic, environmental, hormonal, and lifestyle factors. While this combination of factors likely increases the risk of cancer, the exact source of  this risk is not currently identifiable or testable.  We discussed that 5 - 10% of breast cancer is hereditary, with most cases associated with BRCA mutations.  There are other genes that can be associated with hereditary breast cancer syndromes.  These include ATM, CHEK2 and PALB2.  We discussed that testing is beneficial for several reasons including knowing how to follow individuals after completing their treatment, identifying whether potential treatment options such as PARP inhibitors would be beneficial, and understand if other family members could be at risk for cancer and allow them to undergo genetic testing.   We reviewed the characteristics, features and inheritance patterns of hereditary cancer syndromes. We also discussed genetic testing, including the appropriate family members to test, the process of testing, insurance coverage and turn-around-time for results. We discussed the implications of a negative, positive, carrier and/or variant of uncertain significant result. [name] Brown  was offered a common hereditary cancer panel (47 genes) and an expanded pan-cancer panel (77 genes). [name] Brown was informed of the benefits and limitations of each panel, including that expanded pan-cancer panels contain genes that do not have clear management guidelines at this point in time.  We also discussed that as the number of genes included on a panel increases, the chances of variants of  uncertain significance increases. [name] Brown decided to pursue genetic testing for the  gene panel.   Based on [name] Brown's family history of cancer, she meets medical criteria for genetic testing. Despite that she meets criteria, she may still have an out of pocket cost. We discussed that if her out of pocket cost for testing is over $100, the laboratory will call and confirm whether she wants to proceed with testing.  If the out of pocket cost of testing is less than $100 she will be billed by the genetic testing laboratory.   Based on the patient's family history, a statistical model (Tyrer Cusik) was used to estimate her risk of developing breast cancer. This estimates her lifetime risk of developing breast cancer to be approximately 12.9%. This estimation does not consider any genetic testing results.  The patient's lifetime breast cancer risk is a preliminary estimate based on available information using one of several models endorsed by the [place] (ACS). The ACS recommends consideration of breast MRI screening as an adjunct to mammography for patients at high risk (defined as 20% or greater lifetime risk). Please note that a woman's breast cancer risk changes over time. It may increase or decrease based on age and any changes to the personal and/or family medical history. The risks and recommendations listed above apply to this patient at this point in time. In the future, she may or may not be eligible for the same medical management strategies and, in some cases, other medical management strategies may become available to her. If she is interested in an updated breast cancer risk assessment at a later date, she can contact us.    PLAN: After considering the risks, benefits, and limitations, [name] Brown provided informed consent to pursue genetic testing and the blood sample was sent to [facility] for analysis of the CancerNext-Expanded+RNAinsight. Results should be  available within approximately 2-3 weeks' time, at which point they will be disclosed by telephone to [name] Brown, as will any additional recommendations warranted by these results. [name] Brown will receive a summary of her genetic counseling visit and a copy of her results once available. This information will also be available in Epic.  Lastly, we encouraged [name] Brown to remain in contact with cancer genetics annually so that we can continuously update the family history and inform her of any changes in cancer genetics and testing that may be of benefit for this family.   [name] Brown questions were answered to her satisfaction today. Our contact information was provided should additional questions or concerns arise. Thank you for the referral and allowing [place] to share in the care of your patient.   [name] Brown P. Florene Glen, [place], [facility] Licensed, Insurance risk surveyor [email or web address] phone: [phone]  The patient was seen for a total of 40 minutes in face-to-face genetic counseling.  The patient was seen alone.  Drs. Michell Heinrich, and/or [place] were available for questions, if needed..    _______________________________________________________________________ For Office Staff:  Number of people involved in session: 1 Was an Intern/ student involved with case: no

## 2022-09-17 ENCOUNTER — Telehealth: Payer: Self-pay | Admitting: Pulmonary Disease

## 2022-09-17 ENCOUNTER — Other Ambulatory Visit: Payer: Self-pay

## 2022-09-17 MED ORDER — FLUTICASONE-SALMETEROL 250-50 MCG/ACT IN AEPB: 1.0000 | INHALATION_SPRAY | Freq: Two times a day (BID) | RESPIRATORY_TRACT | 6 refills | Status: DC

## 2022-09-18 MED ORDER — FLUTICASONE-SALMETEROL 250-50 MCG/ACT IN AEPB: 1.0000 | INHALATION_SPRAY | Freq: Two times a day (BID) | RESPIRATORY_TRACT | 6 refills | Status: DC

## 2022-09-18 MED ORDER — FLUTICASONE PROPIONATE 50 MCG/ACT NA SUSP: 2.0000 | Freq: Every day | NASAL | 3 refills | Status: DC

## 2022-09-18 NOTE — Telephone Encounter (Signed)
Called and left voicemail for patient that I was refilling medications as requested and the pharmacy I was sending them to. Nothing further needed

## 2022-10-01 ENCOUNTER — Ambulatory Visit: Payer: Self-pay | Admitting: Genetic Counselor

## 2022-10-01 ENCOUNTER — Telehealth: Payer: Self-pay | Admitting: Genetic Counselor

## 2022-10-01 ENCOUNTER — Encounter: Payer: Self-pay | Admitting: Genetic Counselor

## 2022-10-01 DIAGNOSIS — Z1379 Encounter for other screening for genetic and chromosomal anomalies: Secondary | ICD-10-CM

## 2022-10-01 NOTE — Telephone Encounter (Signed)
Left message on VM that results were back and to please call.  Left CB instructions.

## 2022-10-01 NOTE — Progress Notes (Signed)
HPI:  [name] Brown was previously seen in the [facility] [place] clinic due to a family history of breast and other cancers and concerns regarding a hereditary predisposition to cancer. Please refer to our prior cancer genetics clinic note for more information regarding our discussion, assessment and recommendations, at the time. [name] Brown recent genetic test results were disclosed to her, as were recommendations warranted by these results. These results and recommendations are discussed in more detail below.  CANCER HISTORY:  Oncology History   No history exists.    FAMILY HISTORY:  We obtained a detailed, 4-generation family history.  Significant diagnoses are listed below: Family History  Problem Relation Age of Onset   Breast cancer Mother 65       double mast   Osteoarthritis Mother    Other Mother        has nipple sparing mastectomies due to "abnormal breast finding"   Heart disease Father    Breast cancer Maternal Aunt 68   Melanoma Maternal Aunt 69   Breast cancer Maternal Aunt 58   Liver cancer Maternal Uncle    Stomach cancer Maternal Grandmother    Osteoarthritis Maternal Grandfather        Rheumatoid   Colon cancer Maternal Grandfather    Cancer Paternal Grandmother        tongue & throat   Uterine cancer Cousin          The patient has two sons who are cancer free.  She has one brother who is cancer free.  Her father is deceased and her mother is living.   The patient's father died at 38.  He is an only child.  His parents are deceased.  His father died at 58 and his mother died of a head/neck cancer.   The patient's mother had breast cancer at 73 and had a double mastectomy. She had four brothers and three sisters.  One brother died of COPD, one died of liver cancer, another from alcohol problems and the fourth from polio as a child.  One sister had breast cancer at 37 and melanoma at 90.  She had a daughter with uterine cancer at 38.  Another sister had  breast cancer at 67 and again at 60.  The maternal grandparents died of cancer,  The grandmother had stomach cancer and the grandfather had colon cancer.   [name] Brown is unaware of previous family history of genetic testing for hereditary cancer risks. Patient's maternal ancestors are of [place] descent, and paternal ancestors are of [place] and [place] descent. There is no reported Ashkenazi Jewish ancestry. There is no known consanguinity  GENETIC TEST RESULTS: Genetic testing reported out on September 27, 2022 through the CancerNext-Expanded+RNAinsight cancer panel found no pathogenic mutations. The CancerNext-Expanded gene panel offered by [facility] and includes sequencing and rearrangement analysis for the following 77 genes: AIP, ALK, APC*, ATM*, AXIN2, BAP1, BARD1, BLM, BMPR1A, BRCA1*, BRCA2*, BRIP1*, CDC73, CDH1*, CDK4, CDKN1B, CDKN2A, CHEK2*, CTNNA1, DICER1, FANCC, FH, FLCN, GALNT12, KIF1B, LZTR1, MAX, MEN1, MET, MLH1*, MSH2*, MSH3, MSH6*, MUTYH*, NBN, NF1*, NF2, NTHL1, PALB2*, PHOX2B, PMS2*, POT1, PRKAR1A, PTCH1, PTEN*, RAD51C*, RAD51D*, RB1, RECQL, RET, SDHA, SDHAF2, SDHB, SDHC, SDHD, SMAD4, SMARCA4, SMARCB1, SMARCE1, STK11, SUFU, TMEM127, TP53*, TSC1, TSC2, VHL and XRCC2 (sequencing and deletion/duplication); EGFR, EGLN1, HOXB13, KIT, MITF, PDGFRA, POLD1, and POLE (sequencing only); EPCAM and GREM1 (deletion/duplication only). DNA and RNA analyses performed for * genes. The test report has been scanned into EPIC and is located under  the Molecular Pathology section of the Results Review tab.  A portion of the result report is included below for reference.     We discussed with [name] Brown that because current genetic testing is not perfect, it is possible there may be a gene mutation in one of these genes that current testing cannot detect, but that chance is small.  We also discussed, that there could be another gene that has not yet been discovered, or that we have not yet tested,  that is responsible for the cancer diagnoses in the family. It is also possible there is a hereditary cause for the cancer in the family that [name] Brown did not inherit and therefore was not identified in her testing.  Therefore, it is important to remain in touch with cancer genetics in the future so that we can continue to offer [name] Brown the most up to date genetic testing.   ADDITIONAL GENETIC TESTING: We discussed with [name] Brown that her genetic testing was fairly extensive.  If there are genes identified to increase cancer risk that can be analyzed in the future, we would be happy to discuss and coordinate this testing at that time.   Reviewing her current testing, the RNA portion of testing was not able to be completed due to insufficient sample quality.  An additional blood sample will be drawn on Wednesday, October 02, 2022 so that the testing can be completed.   CANCER SCREENING RECOMMENDATIONS: [name] Brown test result is considered negative (normal).  This means that we have not identified a hereditary cause for her family history of cancer at this time. Most cancers happen by chance and this negative test suggests that her cancer may fall into this category.    While reassuring, this does not definitively rule out a hereditary predisposition to cancer. It is still possible that there could be genetic mutations that are undetectable by current technology. There could be genetic mutations in genes that have not been tested or identified to increase cancer risk.  Therefore, it is recommended she continue to follow the cancer management and screening guidelines provided by her primary healthcare provider.   An individual's cancer risk and medical management are not determined by genetic test results alone. Overall cancer risk assessment incorporates additional factors, including personal medical history, family history, and any available genetic information that may result in a personalized plan  for cancer prevention and surveillance  RECOMMENDATIONS FOR FAMILY MEMBERS:  Individuals in this family might be at some increased risk of developing cancer, over the general population risk, simply due to the family history of cancer.  We recommended women in this family have a yearly mammogram beginning at age 30, or 43 years younger than the earliest onset of cancer, an annual clinical breast exam, and perform monthly breast self-exams. Women in this family should also have a gynecological exam as recommended by their primary provider. All family members should be referred for colonoscopy starting at age 26.  FOLLOW-UP: Lastly, we discussed with [name] Brown that cancer genetics is a rapidly advancing field and it is possible that new genetic tests will be appropriate for her and/or her family members in the future. We encouraged her to remain in contact with cancer genetics on an annual basis so we can update her personal and family histories and let her know of advances in cancer genetics that may benefit this family.   Our contact number was provided. [name] Brown questions were answered to her satisfaction, and she  knows she is welcome to call us at anytime with additional questions or concerns.   Roma Kayser, [place], [facility] Licensed, Certified Genetic Counselor [email or web address]

## 2022-10-01 NOTE — Telephone Encounter (Signed)
Revealed negative genetic testing.  Discussed that we do not know why there is cancer in the family. It could be due to a different gene that we are not testing, or maybe our current technology may not be able to pick something up.  It will be important for her to keep in contact with genetics to keep up with whether additional testing may be needed.   Revealed that RNA was not able to be performed due to insufficient quality.  Patient will have an additional blood draw in order to complete the testing.  This will be called out once it is back.

## 2022-10-02 ENCOUNTER — Inpatient Hospital Stay: Payer: PPO | Attending: Genetic Counselor

## 2022-10-04 ENCOUNTER — Ambulatory Visit: Payer: PPO

## 2022-10-04 ENCOUNTER — Ambulatory Visit
Admission: RE | Admit: 2022-10-04 | Discharge: 2022-10-04 | Disposition: A | Discharge status: Home / Self Care | Payer: PPO | Source: Ambulatory Visit | Attending: Obstetrics & Gynecology | Admitting: Obstetrics & Gynecology

## 2022-10-04 ENCOUNTER — Ambulatory Visit: Admission: RE | Admit: 2022-10-04 | Payer: PPO | Source: Ambulatory Visit

## 2022-10-04 DIAGNOSIS — N644 Mastodynia: Secondary | ICD-10-CM

## 2022-10-04 DIAGNOSIS — R922 Inconclusive mammogram: Secondary | ICD-10-CM | POA: Diagnosis not present

## 2022-10-11 ENCOUNTER — Telehealth: Payer: Self-pay | Admitting: Genetic Counselor

## 2022-10-11 NOTE — Telephone Encounter (Signed)
Revealed that the RNA portion of her testing was negative.  I will upload this to EPIC so she has access to the report.

## 2022-10-11 NOTE — Progress Notes (Signed)
RNA was completed on October 10, 2022.  This was negative.

## 2022-10-28 ENCOUNTER — Ambulatory Visit: Payer: PPO | Admitting: Pulmonary Disease

## 2022-12-04 ENCOUNTER — Ambulatory Visit (INDEPENDENT_AMBULATORY_CARE_PROVIDER_SITE_OTHER): Payer: PPO | Admitting: Pulmonary Disease

## 2022-12-04 ENCOUNTER — Encounter: Payer: Self-pay | Admitting: Pulmonary Disease

## 2022-12-04 VITALS — BP 120/82 | HR 100 | Temp 97.9°F | Ht 59.5 in | Wt 151.6 lb

## 2022-12-04 DIAGNOSIS — R093 Abnormal sputum: Secondary | ICD-10-CM | POA: Diagnosis not present

## 2022-12-04 DIAGNOSIS — J45991 Cough variant asthma: Secondary | ICD-10-CM

## 2022-12-04 DIAGNOSIS — J329 Chronic sinusitis, unspecified: Secondary | ICD-10-CM | POA: Diagnosis not present

## 2022-12-04 NOTE — Patient Instructions (Signed)
Your lungs sounded clear today.  Continue using your Advair as you are doing.  You may use plain Mucinex over-the-counter, for when you are phlegm gets too thick.  Make sure you drink plenty of water during the day.  This will also help.  Will see you in follow-up in 6 months time call sooner should any new problems arise.

## 2022-12-04 NOTE — Progress Notes (Signed)
Subjective:    Patient ID: [name] Brown, female    DOB: 1956-04-10, 67 y.o.   MRN: [id number] Patient Care Team: Eulas Post, MD as PCP - General [facility])  Chief Complaint  Patient presents with   Follow-up    No SOB, wheezing, or cough.  Did having wheezing and a cough 3 weeks ago when she felt time she had some sinus issues.    HPI 67 year old lifelong never smoker who follows here for the issue of cough variant asthma.  We last saw her on 11 July 2022 at that time she was instructed to continue Advair 250/50.  This has been good controlling her cough.  She had an upper respiratory illness approximately 3 weeks ago but has recovered from that.  Did not require prescribed medications was able to control symptoms with over-the-counter medications.  Since then she feels better except she feels that she has thicker phlegm this is difficult to expectorate. No fevers, chills or sweats. No cough or sputum production. No chest tightness. No orthopnea or paroxysmal nocturnal dyspnea. No lower extremity edema or calf tenderness.  Uses albuterol only rarely.    Overall she feels well and looks well.   She had PFTs performed 09 July 2022 that showed FEV1 of 1.67 L or 86% predicted, FVC of 2.03 L or 79% of predicted, FEV1/FVC 82%.  Lung volumes were normal.  Diffusion capacity normal.  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   Review of Systems A 10 point review of systems was performed and it is as noted above otherwise negative.  Patient Active Problem List   Diagnosis Date Noted   Genetic testing 10/01/2022   Family history of breast cancer 09/04/2022   Family history of uterine cancer 09/04/2022   Family history of colon cancer 09/04/2022   Urinary frequency 11/23/2021   DDD (degenerative disc disease), cervical 10/09/2018   Depression, major, in remission ([place]) 01/06/2018   Hyperlipidemia 06/07/2015   IBS (irritable bowel syndrome) 03/01/2015    Fibromyalgia 03/01/2015   Hypothyroid 03/01/2015   Pain in joint, shoulder region 01/11/2014   Cervicalgia 01/11/2014   History of fusion of cervical spine 01/11/2014   Headache(784.0) 03/29/2013   Chronic interstitial cystitis 11/05/2012   Social History   Tobacco Use   Smoking status: Never    Passive exposure: Yes   Smokeless tobacco: Never  Substance Use Topics   Alcohol use: Yes    Alcohol/week: 0.0 - 1.0 standard drinks of alcohol   Allergies  Allergen Reactions   Codeine     hallucination   Hydrocodone    Levofloxacin Other (See Comments)   Minocin [Minocycline]    Nsaids Other (See Comments)    Other reaction(s): OTHER Other reaction(s): OTHER   Current Meds  Medication Sig   albuterol (VENTOLIN HFA) 108 (90 Base) MCG/ACT inhaler Inhale 2 puffs into the lungs every 6 (six) hours as needed for wheezing or shortness of breath.   amitriptyline (ELAVIL) 25 MG tablet TAKE 1 TABLET BY MOUTH AT BEDTIME   cyclobenzaprine (FLEXERIL) 10 MG tablet Take 1 tablet (10 mg total) by mouth at bedtime.   DULoxetine (CYMBALTA) 30 MG capsule Take 1 capsule (30 mg total) by mouth 3 (three) times daily.   ezetimibe (ZETIA) 10 MG tablet Take 1 tablet (10 mg total) by mouth daily.   fluticasone (FLONASE) 50 MCG/ACT nasal spray Place 2 sprays into both nostrils daily.   fluticasone-salmeterol (ADVAIR) 250-50 MCG/ACT AEPB Inhale 1 puff into  the lungs in the morning and at bedtime.   levothyroxine (SYNTHROID) 25 MCG tablet TAKE 1 TABLET BY MOUTH ONCE DAILY BEFORE BREAKFAST   Magnesium 500 MG CAPS Take 1 capsule by mouth at bedtime.   Multiple Vitamin (MULTI-VITAMIN) tablet Take 1 tablet by mouth daily.   omeprazole (PRILOSEC) 40 MG capsule Take 40 mg by mouth daily.   polyethylene glycol (MIRALAX / GLYCOLAX) packet Take 17 g by mouth daily.   psyllium (REGULOID) 0.52 g capsule Take 0.52 g by mouth daily.   UNABLE TO FIND Metronidazole 1% + Ivermectin 1% Apply to face nightly    Immunization History  Administered Date(s) Administered   Fluad Quad(high Dose 65+) 07/11/2022   Influenza,inj,Quad PF,6+ Mos 06/07/2015, 06/18/2016, 07/01/2017, 08/03/2018, 07/12/2020   Influenza-Unspecified 07/19/2014, 08/03/2018, 07/30/2021   Moderna Sars-Covid-2 Vaccination 12/22/2019, 01/19/2020, 08/23/2020   Tdap 01/29/2007, 10/14/2016   Zoster Recombinat (Shingrix) 05/04/2020, 10/09/2020       Objective:   Physical Exam BP 120/82 (BP Location: Left Arm, Cuff Size: Normal)   Pulse 100   Temp 97.9 F (36.6 C)   Ht 4' 11.5" (1.511 m)   Wt 151 lb 9.6 oz (68.8 kg)   LMP 01/28/1997   SpO2 95%   BMI 30.11 kg/m   SpO2: 95 % O2 Device: None (Room air)  GENERAL: Awake and alert, no focal deficit.  Fully ambulatory.  No conversational dyspnea. HEAD: Normocephalic, atraumatic.  EYES: Pupils equal, round, reactive to light. No scleral icterus.  MOUTH: Oral mucosa moist, no thrush. NECK: Supple. No thyromegaly. No nodules. No JVD. Trachea midline. PULMONARY: Good air entry bilaterally.  No adventitious sounds.   CARDIOVASCULAR: S1 and S2. Regular rate and rhythm.  GASTROINTESTINAL: Nondistended. MUSCULOSKELETAL: No joint swelling, no clubbing, nor edema.  NEUROLOGIC: No focal deficits.  Awake and alert, fluent speech. SKIN: Intact,warm,dry.  No petechiae or rashes on limited exam. PSYCH: Mood and behavior appropriate.     Assessment & Plan:     ICD-10-CM   1. Cough variant asthma  J45.991    She appears to be well compensated currently Continue Advair 250/50, 1 inhalation twice a day Continue as needed albuterol    2. 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 Thick sputum  R09.3    Recommend guaifenesin over-the-counter (Mucinex) Stay well-hydrated     Will see the patient in follow-up in 6 months time she is to contact us prior to that time should any new difficulties arise.  Renold Don,  MD Advanced Bronchoscopy PCCM [facility]    *This note was dictated using voice recognition software/Dragon.  Despite best efforts to proofread, errors can occur which can change the meaning. Any transcriptional errors that result from this process are unintentional and may not be fully corrected at the time of dictation.

## 2022-12-17 DIAGNOSIS — E7800 Pure hypercholesterolemia, unspecified: Secondary | ICD-10-CM | POA: Diagnosis not present

## 2022-12-17 DIAGNOSIS — E039 Hypothyroidism, unspecified: Secondary | ICD-10-CM | POA: Diagnosis not present

## 2022-12-17 DIAGNOSIS — F325 Major depressive disorder, single episode, in full remission: Secondary | ICD-10-CM | POA: Diagnosis not present

## 2022-12-17 DIAGNOSIS — M797 Fibromyalgia: Secondary | ICD-10-CM | POA: Diagnosis not present

## 2023-01-21 ENCOUNTER — Encounter: Payer: Self-pay | Admitting: Pulmonary Disease

## 2023-02-26 DIAGNOSIS — D2261 Melanocytic nevi of right upper limb, including shoulder: Secondary | ICD-10-CM | POA: Diagnosis not present

## 2023-02-26 DIAGNOSIS — L570 Actinic keratosis: Secondary | ICD-10-CM | POA: Diagnosis not present

## 2023-02-26 DIAGNOSIS — D2271 Melanocytic nevi of right lower limb, including hip: Secondary | ICD-10-CM | POA: Diagnosis not present

## 2023-02-26 DIAGNOSIS — D2262 Melanocytic nevi of left upper limb, including shoulder: Secondary | ICD-10-CM | POA: Diagnosis not present

## 2023-02-26 DIAGNOSIS — Z85828 Personal history of other malignant neoplasm of skin: Secondary | ICD-10-CM | POA: Diagnosis not present

## 2023-02-26 DIAGNOSIS — D225 Melanocytic nevi of trunk: Secondary | ICD-10-CM | POA: Diagnosis not present

## 2023-03-26 DIAGNOSIS — E039 Hypothyroidism, unspecified: Secondary | ICD-10-CM | POA: Diagnosis not present

## 2023-03-26 DIAGNOSIS — Z0000 Encounter for general adult medical examination without abnormal findings: Secondary | ICD-10-CM | POA: Diagnosis not present

## 2023-03-26 DIAGNOSIS — F325 Major depressive disorder, single episode, in full remission: Secondary | ICD-10-CM | POA: Diagnosis not present

## 2023-03-26 DIAGNOSIS — M797 Fibromyalgia: Secondary | ICD-10-CM | POA: Diagnosis not present

## 2023-08-21 ENCOUNTER — Ambulatory Visit: Payer: PPO | Admitting: Pulmonary Disease

## 2023-08-21 ENCOUNTER — Encounter: Payer: Self-pay | Admitting: Pulmonary Disease

## 2023-08-21 VITALS — BP 100/60 | HR 89 | Temp 98.1°F | Ht 59.5 in | Wt 128.8 lb

## 2023-08-21 DIAGNOSIS — J45991 Cough variant asthma: Secondary | ICD-10-CM

## 2023-08-21 DIAGNOSIS — Z23 Encounter for immunization: Secondary | ICD-10-CM

## 2023-08-21 DIAGNOSIS — J329 Chronic sinusitis, unspecified: Secondary | ICD-10-CM

## 2023-08-21 NOTE — Progress Notes (Unsigned)
Subjective:    Patient ID: [name] Brown, female    DOB: 02-Aug-1956, 67 y.o.   MRN: [id number]  Patient Care Team: Bosie Clos, MD as PCP - General [facility])  Chief Complaint  Patient presents with   Follow-up    No cough,shortness of breath or wheezing.     BACKGROUND/INTERVAL: Patient is a 67 year old lifelong never smoker follows here for the issue of cough variant asthma.  Last seen on 01 December 2022.  No exacerbations or hospitalizations since that admission.  She is doing well with Advair 250/50 1 inhalation twice a day and as needed albuterol.  HPI Discussed the use of AI scribe software for clinical note transcription with the patient, who gave verbal consent to proceed.  History of Present Illness   The patient, with a known history of cough variant asthma, presented for a follow-up visit. She reported that her cough is well controlled, indicating effective management with her current inhaler, Advair. She did not report any new or worsening symptoms related to her asthma.  In addition, the patient reported no issues with her current medications and did not request any refills at the time of the visit. She also mentioned that she has not yet received her flu shot for the season.  Lastly, the patient did not report any symptoms suggestive of gastrointestinal issues. The patient's overall health status appears stable with her current treatment regimen.      DATA 07/09/2022 PFTs:FEV1 of 1.67 L or 86% predicted, FVC of 2.03 L or 79% of predicted, FEV1/FVC 82%. Lung volumes were normal. Diffusion capacity normal. 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   Review of Systems A 10 point review of systems was performed and it is as noted above otherwise negative.   Patient Active Problem List   Diagnosis Date Noted   Genetic testing 10/01/2022   Family history of breast cancer 09/04/2022   Family history of uterine cancer 09/04/2022   Family  history of colon cancer 09/04/2022   Urinary frequency 11/23/2021   DDD (degenerative disc disease), cervical 10/09/2018   Depression, major, in remission ([facility]) 01/06/2018   Hyperlipidemia 06/07/2015   IBS (irritable bowel syndrome) 03/01/2015   Fibromyalgia 03/01/2015   Hypothyroid 03/01/2015   Pain in joint, shoulder region 01/11/2014   Cervicalgia 01/11/2014   History of fusion of cervical spine 01/11/2014   Headache 03/29/2013   Chronic interstitial cystitis 11/05/2012    Social History   Tobacco Use   Smoking status: Never    Passive exposure: Yes   Smokeless tobacco: Never  Substance Use Topics   Alcohol use: Yes    Alcohol/week: 0.0 - 1.0 standard drinks of alcohol    Allergies  Allergen Reactions   Codeine     hallucination   Hydrocodone    Levofloxacin Other (See Comments)   Minocin [Minocycline]    Nsaids Other (See Comments)    Other reaction(s): OTHER Other reaction(s): OTHER    Current Meds  Medication Sig   albuterol (VENTOLIN HFA) 108 (90 Base) MCG/ACT inhaler Inhale 2 puffs into the lungs every 6 (six) hours as needed for wheezing or shortness of breath.   amitriptyline (ELAVIL) 25 MG tablet TAKE 1 TABLET BY MOUTH AT BEDTIME   cyclobenzaprine (FLEXERIL) 10 MG tablet Take 1 tablet (10 mg total) by mouth at bedtime.   DULoxetine (CYMBALTA) 30 MG capsule Take 1 capsule (30 mg total) by mouth 3 (three) times daily.   ezetimibe (ZETIA) 10 MG tablet  Take 1 tablet (10 mg total) by mouth daily.   fluticasone (FLONASE) 50 MCG/ACT nasal spray Place 2 sprays into both nostrils daily.   fluticasone-salmeterol (ADVAIR) 250-50 MCG/ACT AEPB Inhale 1 puff into the lungs in the morning and at bedtime.   levothyroxine (SYNTHROID) 25 MCG tablet TAKE 1 TABLET BY MOUTH ONCE DAILY BEFORE BREAKFAST   Magnesium 500 MG CAPS Take 1 capsule by mouth at bedtime.   Multiple Vitamin (MULTI-VITAMIN) tablet Take 1 tablet by mouth daily.   UNABLE TO FIND Metronidazole 1% + Ivermectin  1% Apply to face nightly    Immunization History  Administered Date(s) Administered   Fluad Quad(high Dose 65+) 07/11/2022   Influenza,inj,Quad PF,6+ Mos 06/07/2015, 06/18/2016, 07/01/2017, 08/03/2018, 07/12/2020   Influenza-Unspecified 07/19/2014, 08/03/2018, 07/30/2021   Moderna Sars-Covid-2 Vaccination 12/22/2019, 01/19/2020, 08/23/2020   Tdap 01/29/2007, 10/14/2016   Zoster Recombinant(Shingrix) 05/04/2020, 10/09/2020        Objective:     BP (!) 86/56 (BP Location: Left Arm, Patient Position: Sitting, Cuff Size: Normal)   Pulse 89   Temp 98.1 F (36.7 C) (Temporal)   Ht 4' 11.5" (1.511 m)   Wt 128 lb 12.8 oz (58.4 kg)   LMP 01/28/1997   SpO2 99%   BMI 25.58 kg/m   SpO2: 99 %  GENERAL: Awake and alert, no focal deficit.  Fully ambulatory.  No conversational dyspnea. HEAD: Normocephalic, atraumatic.  EYES: Pupils equal, round, reactive to light. No scleral icterus.  MOUTH: Oral mucosa moist, no thrush. NECK: Supple. No thyromegaly. No nodules. No JVD. Trachea midline. PULMONARY: Good air entry bilaterally.  No adventitious sounds.   CARDIOVASCULAR: S1 and S2. Regular rate and rhythm.  GASTROINTESTINAL: Nondistended. MUSCULOSKELETAL: No joint swelling, no clubbing, nor edema.  NEUROLOGIC: No focal deficits.  Awake and alert, fluent speech. SKIN: Intact,warm,dry.  No petechiae or rashes on limited exam. PSYCH: Mood and behavior appropriate.        Assessment & Plan:     ICD-10-CM   1. Cough variant asthma  J45.991     2. 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 Need for immunization against influenza  Z23 Flu Vaccine Trivalent High Dose (Fluad)      Orders Placed This Encounter  Procedures   Flu Vaccine Trivalent High Dose (Fluad)    No orders of the defined types were placed in this encounter.  Assessment and Plan    Cough Variant Asthma   Cough variant asthma is well controlled with Advair. The patient reports no issues with cough.   - Continue  Advair   - Follow-up in six months    General Health Maintenance   The patient has not received her flu shot for the current season due to caregiving responsibilities.   - Administer flu shot today.      Gailen Shelter, MD Advanced Bronchoscopy PCCM [facility]    *This note was generated using voice recognition software/Dragon and/or AI transcription program.  Despite best efforts to proofread, errors can occur which can change the meaning. Any transcriptional errors that result from this process are unintentional and may not be fully corrected at the time of dictation.

## 2023-08-21 NOTE — Patient Instructions (Signed)
VISIT SUMMARY:  During your follow-up visit, we discussed your current health status and management of your conditions. Your cough variant asthma is well controlled with your current medication, and you reported no new or worsening symptoms. Additionally, you mentioned that you have not yet received your flu shot for the season. Overall, your health appears stable with your current treatment regimen.  YOUR PLAN:  -COUGH VARIANT ASTHMA: Cough variant asthma is a type of asthma where the main symptom is a dry, non-productive cough. Your asthma is well controlled with Advair, and you should continue using it as prescribed. We will follow up in six months to monitor your condition.  -GENERAL HEALTH MAINTENANCE: You have not received your flu shot for the current season. It is important to get vaccinated to protect yourself from the flu. We will administer the flu shot today.  INSTRUCTIONS:  Please continue using Advair as prescribed for your asthma and monitor for any changes in your symptoms. We will follow up in six months to reassess your condition. Additionally, you will receive your flu shot today to protect against the flu.

## 2023-08-25 ENCOUNTER — Encounter: Payer: Self-pay | Admitting: Pulmonary Disease

## 2023-10-07 ENCOUNTER — Other Ambulatory Visit: Payer: Self-pay | Admitting: Obstetrics & Gynecology

## 2023-10-07 DIAGNOSIS — Z1231 Encounter for screening mammogram for malignant neoplasm of breast: Secondary | ICD-10-CM

## 2023-10-08 ENCOUNTER — Encounter (HOSPITAL_BASED_OUTPATIENT_CLINIC_OR_DEPARTMENT_OTHER): Payer: Self-pay | Admitting: Obstetrics & Gynecology

## 2023-10-08 ENCOUNTER — Ambulatory Visit (HOSPITAL_BASED_OUTPATIENT_CLINIC_OR_DEPARTMENT_OTHER): Payer: PPO | Admitting: Obstetrics & Gynecology

## 2023-10-08 VITALS — BP 108/71 | HR 84 | Ht 59.25 in | Wt 121.1 lb

## 2023-10-08 DIAGNOSIS — M85851 Other specified disorders of bone density and structure, right thigh: Secondary | ICD-10-CM

## 2023-10-08 DIAGNOSIS — Z01419 Encounter for gynecological examination (general) (routine) without abnormal findings: Secondary | ICD-10-CM

## 2023-10-08 DIAGNOSIS — Z90710 Acquired absence of both cervix and uterus: Secondary | ICD-10-CM

## 2023-10-08 DIAGNOSIS — Z780 Asymptomatic menopausal state: Secondary | ICD-10-CM

## 2023-10-08 DIAGNOSIS — M85852 Other specified disorders of bone density and structure, left thigh: Secondary | ICD-10-CM

## 2023-10-08 DIAGNOSIS — N3010 Interstitial cystitis (chronic) without hematuria: Secondary | ICD-10-CM

## 2023-10-08 NOTE — Progress Notes (Signed)
 68 y.o. [id number] Married White or Caucasian female here for breast and pelvic exam.  I am also following her for strong family hx of breast cancer.  She did go for genetic counseling last year and had genetic testing.  This was all negative.  She is having mammogram for breast cancer screening.  Breast MRI not recommended at this time.  Mammogram is scheduled for 10/28/2023.    Denies vaginal bleeding.   H/o ulcerative vulvar lesion noted in 2022.  HSV testing was negative.  Not present today but reports she does occasionally have something similar to occur.  We discussed obtaining a biopsy of a lesion if occurs again.    Patient's last menstrual period was 01/28/1997.          Sexually active: No.  H/O STD:  no  Health Maintenance: PCP:  Dr. Bertrum.  Last wellness appt was 11/2022.  Did blood work at that appt:  yes Vaccines are up to date:  has not done pneumonia vaccination Colonoscopy:  2018, follow up 10 years MMG:  10/04/2022 BMD:  11/27/2021 Last pap smear:  h/o LAVH/BSO.      reports that she has never smoked. She has been exposed to tobacco smoke. She has never used smokeless tobacco. She reports current alcohol use. She reports that she does not use drugs.  Past Medical History:  Diagnosis Date   Anxiety    Cervical spondylosis without myelopathy    Cervicalgia 01/11/2014   Degeneration of cervical intervertebral disc    Endometriosis    Eosinophilic asthma    Family history of breast cancer    Family history of colon cancer    Family history of uterine cancer    Fibromyalgia    Headache(784.0)    High cholesterol    History of fusion of cervical spine 01/11/2014   IC (interstitial cystitis)    Myalgia and myositis, unspecified    Osteoarthrosis, unspecified whether generalized or localized, unspecified site    Osteopenia    Other and unspecified hyperlipidemia    Pain in joint, shoulder region 01/11/2014   Squamous cell carcinoma    [facility]- place on  chest    Past Surgical History:  Procedure Laterality Date   BLADDER SUSPENSION  07/2010   With cystocele repair, Dr. Janit   ELBOW SURGERY Right 08/2011   LAPAROSCOPIC ASSISTED VAGINAL HYSTERECTOMY  01/1996   wtih BSO   SPINAL FUSION  10/2010   C5-C6, C6-C7, Dr. Colon   TONSILLECTOMY  1969    Current Outpatient Medications  Medication Sig Dispense Refill   albuterol  (VENTOLIN  HFA) 108 (90 Base) MCG/ACT inhaler Inhale 2 puffs into the lungs every 6 (six) hours as needed for wheezing or shortness of breath. 8 g 2   amitriptyline  (ELAVIL ) 25 MG tablet TAKE 1 TABLET BY MOUTH AT BEDTIME 90 tablet 0   cyclobenzaprine  (FLEXERIL ) 10 MG tablet Take 1 tablet (10 mg total) by mouth at bedtime. 30 tablet 11   DULoxetine  (CYMBALTA ) 30 MG capsule Take 1 capsule (30 mg total) by mouth 3 (three) times daily. 270 capsule 3   ezetimibe  (ZETIA ) 10 MG tablet Take 1 tablet (10 mg total) by mouth daily. 90 tablet 3   fluticasone  (FLONASE ) 50 MCG/ACT nasal spray Place 2 sprays into both nostrils daily. 16 g 3   fluticasone -salmeterol (ADVAIR ) 250-50 MCG/ACT AEPB Inhale 1 puff into the lungs in the morning and at bedtime. 60 each 6   levothyroxine  (SYNTHROID ) 25 MCG tablet TAKE 1 TABLET BY  MOUTH ONCE DAILY BEFORE BREAKFAST 90 tablet 1   Magnesium 500 MG CAPS Take 1 capsule by mouth at bedtime.     Multiple Vitamin (MULTI-VITAMIN) tablet Take 1 tablet by mouth daily.     UNABLE TO FIND Metronidazole 1% + Ivermectin 1% Apply to face nightly     No current facility-administered medications for this visit.   Facility-Administered Medications Ordered in Other Visits  Medication Dose Route Frequency Provider Last Rate Last Admin   albuterol  (PROVENTIL ) (2.5 MG/3ML) 0.083% nebulizer solution 2.5 mg  2.5 mg Nebulization Once Tamea Dedra CROME, MD        Family History  Problem Relation Age of Onset   Breast cancer Mother 19       double mast   Osteoarthritis Mother    Other Mother        has nipple  sparing mastectomies due to abnormal breast finding   Heart disease Father    Breast cancer Maternal Aunt 68   Melanoma Maternal Aunt 69   Breast cancer Maternal Aunt 58   Liver cancer Maternal Uncle    Stomach cancer Maternal Grandmother    Osteoarthritis Maternal Grandfather        Rheumatoid   Colon cancer Maternal Grandfather    Cancer Paternal Grandmother        tongue & throat   Uterine cancer Cousin     Review of Systems  Constitutional: Negative.   Genitourinary: Negative.     Exam:   BP 108/71 (BP Location: Left Arm, Patient Position: Sitting, Cuff Size: Normal)   Pulse 84   Ht 4' 11.25 (1.505 m)   Wt 121 lb 1.6 oz (54.9 kg)   LMP 01/28/1997   BMI 24.25 kg/m   Height: 4' 11.25 (150.5 cm)  General appearance: alert, cooperative and appears stated age Breasts: normal appearance, no masses or tenderness Abdomen: soft, non-tender; bowel sounds normal; no masses,  no organomegaly Lymph nodes: Cervical, supraclavicular, and axillary nodes normal.  No abnormal inguinal nodes palpated Neurologic: Grossly normal  Pelvic: External genitalia:  no lesions              Urethra:  normal appearing urethra with no masses, tenderness or lesions              Bartholins and Skenes: normal                 Vagina: normal appearing vagina with atrophic changes and no discharge, no lesions              Cervix: absent              Pap taken: No. Bimanual Exam:  Uterus:  uterus absent              Adnexa: no mass, fullness, tenderness               Rectovaginal: Confirms               Anus:  normal sphincter tone, no lesions  Chaperone, Bascom Kotyk, CMA, was present for exam.  Assessment/Plan: 1. Encntr for gyn exam (general) (routine) w/o abn findings (Primary) - Pap smear not indicated - Mammogram 10/04/2022.  Is scheduled for late January. - Colonoscopy 2018.  Follow up 10 years.  Will need new GI provider when due as her prior provider retired - Bone mineral density  2023 - lab work done with PCP, Dr. Bertrum - vaccines reviewed/updated.  She has not done pneumonia vaccinations.  Reviewed  Dr. Chaya notes and not documented there.  Advised to start with Prevnar 20.    2. Osteopenia of necks of both femurs - taking MVI with Vit D.  Discussed when to repeat.  3. Postmenopausal - not on HRT  4. History of LAVH - with BSO  5. Chronic interstitial cystitis

## 2023-10-28 ENCOUNTER — Ambulatory Visit
Admission: RE | Admit: 2023-10-28 | Discharge: 2023-10-28 | Disposition: A | Discharge status: Home / Self Care | Payer: PPO | Source: Ambulatory Visit | Attending: Obstetrics & Gynecology | Admitting: Obstetrics & Gynecology

## 2023-10-28 DIAGNOSIS — Z1231 Encounter for screening mammogram for malignant neoplasm of breast: Secondary | ICD-10-CM

## 2023-11-03 ENCOUNTER — Encounter (HOSPITAL_BASED_OUTPATIENT_CLINIC_OR_DEPARTMENT_OTHER): Payer: Self-pay | Admitting: Obstetrics & Gynecology

## 2023-11-04 ENCOUNTER — Other Ambulatory Visit (HOSPITAL_BASED_OUTPATIENT_CLINIC_OR_DEPARTMENT_OTHER): Payer: Self-pay | Admitting: Obstetrics & Gynecology

## 2023-11-04 DIAGNOSIS — B001 Herpesviral vesicular dermatitis: Secondary | ICD-10-CM

## 2023-11-04 MED ORDER — VALACYCLOVIR HCL 1 G PO TABS: ORAL_TABLET | ORAL | 0 refills | Status: AC

## 2023-12-08 ENCOUNTER — Other Ambulatory Visit: Payer: Self-pay | Admitting: Pulmonary Disease

## 2024-01-14 ENCOUNTER — Other Ambulatory Visit: Payer: Self-pay | Admitting: Pulmonary Disease

## 2024-02-18 ENCOUNTER — Encounter: Payer: Self-pay | Admitting: Pulmonary Disease

## 2024-02-18 ENCOUNTER — Ambulatory Visit: Payer: PPO | Admitting: Pulmonary Disease

## 2024-02-18 VITALS — BP 104/64 | HR 81 | Temp 97.6°F | Ht 59.5 in | Wt 118.8 lb

## 2024-02-18 DIAGNOSIS — J329 Chronic sinusitis, unspecified: Secondary | ICD-10-CM

## 2024-02-18 DIAGNOSIS — J45991 Cough variant asthma: Secondary | ICD-10-CM

## 2024-02-18 MED ORDER — FLUTICASONE-SALMETEROL 250-50 MCG/ACT IN AEPB: 1.0000 | INHALATION_SPRAY | Freq: Two times a day (BID) | RESPIRATORY_TRACT | 11 refills | Status: AC

## 2024-02-18 NOTE — Patient Instructions (Signed)
 VISIT SUMMARY:  Today, you came in for a follow-up appointment to discuss your coliflamine emphysema. You have been managing your condition well with your inhaler and have not experienced any coughing. You mentioned that you may have overexerted yourself with yard work recently, especially with the heavy pollen due to rain. You requested a refill for your Advair  inhaler as you have about nine days left on your current one. Additionally, you have successfully lost over thirty pounds and are focusing on staying hydrated.  YOUR PLAN:  -ASTHMA, COUGH VARIANT: Asthma is a condition where your airways narrow and swell, which can make breathing difficult. Your asthma is well-controlled with your current inhaler therapy, and you have not had any recent episodes of coughing or exacerbations. Increased activity with yard work may have contributed to mild symptoms. You are advised to avoid menthol-containing products as they may trigger symptoms. Staying hydrated and continuing your weight loss journey may also help control your symptoms. We will ensure that refills for your Advair  inhaler are sent to the pharmacy. You should use non-menthol cough drops, such as Lutein cherry drops, to keep your airways moist.  INSTRUCTIONS:  Please schedule a follow-up appointment in six months.  Call prior to follow-up should any new difficulties arise.

## 2024-02-18 NOTE — Progress Notes (Signed)
 Subjective:    Patient ID: [name] Brown, female    DOB: August 31, 1956, 68 y.o.   MRN: [id number]  Patient Care Team: Nikki Barters, MD as PCP - General [facility])  Chief Complaint  Patient presents with   Follow-up    No cough, shortness of breath on exertion or wheezing.    BACKGROUND/INTERVAL: Patient is a 68 year old lifelong never smoker follows here for the issue of cough variant asthma. Last seen on 21 August 2023. No exacerbations or hospitalizations since that admission. She is doing well with Advair  250/50 1 inhalation twice a day and as needed albuterol .   HPI Discussed the use of AI scribe software for clinical note transcription with the patient, who gave verbal consent to proceed.  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  She has been managing her coliflamine emphysema well with her inhaler and has not experienced any coughing. After doing yard work yesterday, she may have overexerted herself, especially with the recent heavy pollen due to rain. She usually does well with her inhaler and has not needed to use her emergency inhaler recently.  She is currently using Advair  and has about nine days left on her current inhaler. She has requested a refill as the pharmacy has not yet responded to her request. She has not had to use her emergency inhaler and has plenty of refills available for it.  In terms of weight management, she has been on a journey to lose weight and has successfully lost over thirty pounds. She emphasizes the importance of staying hydrated, mentioning that she drinks water frequently but did not bring her bottle to the appointment.      DATA 07/09/2022 PFTs:FEV1 of 1.67 L or 86% predicted, FVC of 2.03 L or 79% of predicted, FEV1/FVC 82%. Lung volumes were normal. Diffusion capacity normal. 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   Review of  Systems A 10 point review of systems was performed and it is as noted above otherwise negative.   Patient Active Problem List   Diagnosis Date Noted   Genetic testing 10/01/2022   Family history of breast cancer 09/04/2022   Family history of uterine cancer 09/04/2022   Family history of colon cancer 09/04/2022   Urinary frequency 11/23/2021   DDD (degenerative disc disease), cervical 10/09/2018   Depression, major, in remission (HCC) 01/06/2018   Hyperlipidemia 06/07/2015   IBS (irritable bowel syndrome) 03/01/2015   Fibromyalgia 03/01/2015   Hypothyroid 03/01/2015   Pain in joint, shoulder region 01/11/2014   Cervicalgia 01/11/2014   History of fusion of cervical spine 01/11/2014   Headache 03/29/2013   Chronic interstitial cystitis 11/05/2012    Social History   Tobacco Use   Smoking status: Never    Passive exposure: Yes   Smokeless tobacco: Never  Substance Use Topics   Alcohol use: Yes    Alcohol/week: 0.0 - 1.0 standard drinks of alcohol    Allergies  Allergen Reactions   Codeine     hallucination   Hydrocodone    Levofloxacin Other (See Comments)   Minocin [Minocycline]    Nsaids Other (See Comments)    Other reaction(s): OTHER Other reaction(s): OTHER    Current Meds  Medication Sig   albuterol  (VENTOLIN  HFA) 108 (90 Base) MCG/ACT inhaler Inhale 2 puffs into the lungs every 6 (six) hours as needed for wheezing or shortness of breath.   amitriptyline  (  ELAVIL ) 25 MG tablet TAKE 1 TABLET BY MOUTH AT BEDTIME   cyclobenzaprine  (FLEXERIL ) 10 MG tablet Take 1 tablet (10 mg total) by mouth at bedtime.   DULoxetine  (CYMBALTA ) 30 MG capsule Take 1 capsule (30 mg total) by mouth 3 (three) times daily.   ezetimibe  (ZETIA ) 10 MG tablet Take 1 tablet (10 mg total) by mouth daily.   fluticasone  (FLONASE ) 50 MCG/ACT nasal spray Place 2 sprays into both nostrils daily.   levothyroxine  (SYNTHROID ) 25 MCG tablet TAKE 1 TABLET BY MOUTH ONCE DAILY BEFORE BREAKFAST    Magnesium 500 MG CAPS Take 1 capsule by mouth at bedtime.   Multiple Vitamin (MULTI-VITAMIN) tablet Take 1 tablet by mouth daily.   UNABLE TO FIND Metronidazole 1% + Ivermectin 1% Apply to face nightly   valACYclovir  (VALTREX ) 1000 MG tablet Take 2 tablets and repeat in 12 hours as needed for fever blister treatment.   [DISCONTINUED] fluticasone -salmeterol (ADVAIR ) 250-50 MCG/ACT AEPB INHALE 1 DOSE BY MOUTH TWICE DAILY (IN  THE  MORNING  AND  AT  BEDTIME)    Immunization History  Administered Date(s) Administered   Fluad Quad(high Dose 65+) 07/11/2022   Fluad Trivalent(High Dose 65+) 08/21/2023   Influenza,inj,Quad PF,6+ Mos 06/07/2015, 06/18/2016, 07/01/2017, 08/03/2018, 07/12/2020   Influenza-Unspecified 07/19/2014, 08/03/2018, 07/30/2021   Moderna Sars-Covid-2 Vaccination 12/22/2019, 01/19/2020, 08/23/2020   Tdap 01/29/2007, 10/14/2016   Zoster Recombinant(Shingrix) 05/04/2020, 10/09/2020        Objective:     BP 104/64 (BP Location: Left Arm, Patient Position: Sitting, Cuff Size: Normal)   Pulse 81   Temp 97.6 F (36.4 C) (Temporal)   Ht 4' 11.5" (1.511 m)   Wt 118 lb 12.8 oz (53.9 kg)   LMP 01/28/1997   SpO2 97%   BMI 23.59 kg/m   SpO2: 97 %  GENERAL: Awake and alert, no focal deficit.  Fully ambulatory.  No conversational dyspnea. HEAD: Normocephalic, atraumatic.  EYES: Pupils equal, round, reactive to light. No scleral icterus.  MOUTH: Oral mucosa moist, no thrush. NECK: Supple. No thyromegaly. No nodules. No JVD. Trachea midline. PULMONARY: Good air entry bilaterally.  No adventitious sounds.   CARDIOVASCULAR: S1 and S2. Regular rate and rhythm.  GASTROINTESTINAL: Nondistended. MUSCULOSKELETAL: No joint swelling, no clubbing, nor edema.  NEUROLOGIC: No focal deficits.  Awake and alert, fluent speech. SKIN: Intact,warm,dry.  No petechiae or rashes on limited exam. PSYCH: Mood and behavior appropriate.        Assessment & Plan:     ICD-10-CM   1. Cough  variant asthma  J45.991     2. 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    Dispense:  60 each    Refill:  11   Discussion:    Asthma, unspecified Asthma is well-controlled with current inhaler therapy. No recent episodes of coughing or exacerbations. Increased activity with yard work may have contributed to mild symptoms. Advised to avoid menthol-containing cough drops as she may trigger symptoms. Adequate hydration and weight loss may contribute to symptom control. - Ensure refills for Advair  inhaler are sent to pharmacy. - Advise use of non-menthol cough drops, such as Ludens cherry drops, to keep airways moist. - Schedule follow-up appointment in six months.      Advised if symptoms do not improve or worsen, to please contact office for sooner follow up  or seek emergency care.    I spent 30 minutes of dedicated to the care of this patient on the date of this encounter to include pre-visit review of records, face-to-face time with the patient discussing conditions above, post visit ordering of testing, clinical documentation with the electronic health record, making appropriate referrals as documented, and communicating necessary findings to members of the patients care team.     C. Chloe Counter, MD Advanced Bronchoscopy PCCM [facility]    *This note was generated using voice recognition software/Dragon and/or AI transcription program.  Despite best efforts to proofread, errors can occur which can change the meaning. Any transcriptional errors that result from this process are unintentional and may not be fully corrected at the time of dictation.

## 2024-02-19 ENCOUNTER — Encounter: Payer: Self-pay | Admitting: Pulmonary Disease

## 2024-07-08 ENCOUNTER — Encounter: Payer: Self-pay | Admitting: Pulmonary Disease

## 2024-07-08 DIAGNOSIS — J329 Chronic sinusitis, unspecified: Secondary | ICD-10-CM

## 2024-07-08 MED ORDER — FLUTICASONE PROPIONATE 50 MCG/ACT NA SUSP
2.0000 | Freq: Every day | NASAL | 3 refills | Status: AC
Start: 2024-07-08 — End: ?

## 2024-09-27 ENCOUNTER — Encounter: Payer: Self-pay | Admitting: Pulmonary Disease

## 2024-09-27 ENCOUNTER — Ambulatory Visit: Admitting: Pulmonary Disease

## 2024-09-27 VITALS — BP 106/60 | HR 88 | Temp 97.6°F | Ht 59.5 in | Wt 124.8 lb

## 2024-09-27 DIAGNOSIS — J45991 Cough variant asthma: Secondary | ICD-10-CM

## 2024-09-27 DIAGNOSIS — J329 Chronic sinusitis, unspecified: Secondary | ICD-10-CM

## 2024-09-27 LAB — NITRIC OXIDE: Nitric Oxide: 32

## 2024-09-27 NOTE — Progress Notes (Unsigned)
 "  Subjective:    Patient ID: [name] Brown, female    DOB: [birth year], 69 y.o.   MRN: [id number]  Patient Care Team: Bertrum Charlie CROME, MD as PCP - General [facility])  Chief Complaint  Patient presents with   Asthma    Clearing throat.     BACKGROUND/INTERVAL:Patient is a 68 year old lifelong never smoker follows here for the issue of cough variant asthma. Last seen on 21 August 2023. No exacerbations or hospitalizations since that admission. She is doing well with Advair  250/50 1 inhalation twice a day and as needed albuterol .   HPI  DATA 07/09/2022 PFTs:FEV1 of 1.67 L or 86% predicted, FVC of 2.03 L or 79% of predicted, FEV1/FVC 82%. Lung volumes were normal. Diffusion capacity normal. 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   Review of Systems A 10 point review of systems was performed and it is as noted above otherwise negative.   Patient Active Problem List   Diagnosis Date Noted   Genetic testing 10/01/2022   Family history of breast cancer 09/04/2022   Family history of uterine cancer 09/04/2022   Family history of colon cancer 09/04/2022   Urinary frequency 11/23/2021   DDD (degenerative disc disease), cervical 10/09/2018   Depression, major, in remission 01/06/2018   Hyperlipidemia 06/07/2015   IBS (irritable bowel syndrome) 03/01/2015   Fibromyalgia 03/01/2015   Hypothyroid 03/01/2015   Pain in joint, shoulder region 01/11/2014   Cervicalgia 01/11/2014   History of fusion of cervical spine 01/11/2014   Headache 03/29/2013   Chronic interstitial cystitis 11/05/2012    Social History   Tobacco Use   Smoking status: Never    Passive exposure: Yes   Smokeless tobacco: Never  Substance Use Topics   Alcohol use: Yes    Alcohol/week: 0.0 - 1.0 standard drinks of alcohol    Allergies[1]  Active Medications[2]  Immunization History  Administered Date(s) Administered   Fluad Quad(high Dose 65+) 07/11/2022   Fluad  Trivalent(High Dose 65+) 08/21/2023   INFLUENZA, HIGH DOSE SEASONAL PF 08/16/2024   Influenza,inj,Quad PF,6+ Mos 06/07/2015, 06/18/2016, 07/01/2017, 08/03/2018, 07/12/2020   Influenza-Unspecified 07/19/2014, 08/03/2018, 07/30/2021   Moderna Sars-Covid-2 Vaccination 12/22/2019, 01/19/2020, 08/23/2020   Tdap 01/29/2007, 10/14/2016   Zoster Recombinant(Shingrix) 05/04/2020, 10/09/2020        Objective:     Vitals:   09/27/24 1545  BP: 106/60  Pulse: 88  Temp: 97.6 F (36.4 C)  Height: 4' 11.5 (1.511 m)  Weight: 124 lb 12.8 oz (56.6 kg)  SpO2: 96%  TempSrc: Temporal  BMI (Calculated): 24.79     SpO2: 96 %  GENERAL: Awake and alert, no focal deficit.  Fully ambulatory.  No conversational dyspnea. HEAD: Normocephalic, atraumatic.  EYES: Pupils equal, round, reactive to light. No scleral icterus.  MOUTH: Oral mucosa moist, no thrush. NECK: Supple. No thyromegaly. No nodules. No JVD. Trachea midline. PULMONARY: Good air entry bilaterally.  No adventitious sounds.   CARDIOVASCULAR: S1 and S2. Regular rate and rhythm.  GASTROINTESTINAL: Nondistended. MUSCULOSKELETAL: No joint swelling, no clubbing, nor edema.  NEUROLOGIC: No focal deficits.  Awake and alert, fluent speech. SKIN: Intact,warm,dry.  No petechiae or rashes on limited exam. PSYCH: Mood and behavior appropriate.  No results found for: NITRICOXIDE No results found for: FENOLVLPPB This result suggests {FeNO results:33685}       Assessment & Plan:     ICD-10-CM   1. Cough variant asthma  J45.991     2. 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   No orders of the defined types were placed in this encounter.     Advised if symptoms do not improve or worsen, to please contact office for sooner follow up or seek emergency care.    I spent xxx minutes of dedicated to the care of this patient on the date of this encounter to include pre-visit  review of records, face-to-face time with the patient discussing conditions above, post visit ordering of testing, clinical documentation with the electronic health record, making appropriate referrals as documented, and communicating necessary findings to members of the patients care team.     C. Leita Sanders, MD Advanced Bronchoscopy PCCM [facility]    *This note was generated using voice recognition software/Dragon and/or AI transcription program.  Despite best efforts to proofread, errors can occur which can change the meaning. Any transcriptional errors that result from this process are unintentional and may not be fully corrected at the time of dictation.    [1]  Allergies Allergen Reactions   Codeine     hallucination   Hydrocodone    Levofloxacin Other (See Comments)   Minocin [Minocycline]    Nsaids Other (See Comments)    Other reaction(s): OTHER Other reaction(s): OTHER  [2]  Current Meds  Medication Sig   albuterol  (VENTOLIN  HFA) 108 (90 Base) MCG/ACT inhaler Inhale 2 puffs into the lungs every 6 (six) hours as needed for wheezing or shortness of breath.   amitriptyline  (ELAVIL ) 25 MG tablet TAKE 1 TABLET BY MOUTH AT BEDTIME   cyclobenzaprine  (FLEXERIL ) 10 MG tablet Take 1 tablet (10 mg total) by mouth at bedtime.   DULoxetine  (CYMBALTA ) 30 MG capsule Take 1 capsule (30 mg total) by mouth 3 (three) times daily.   ezetimibe  (ZETIA ) 10 MG tablet Take 1 tablet (10 mg total) by mouth daily.   fluticasone  (FLONASE ) 50 MCG/ACT nasal spray Place 2 sprays into both nostrils daily.   fluticasone -salmeterol (ADVAIR ) 250-50 MCG/ACT AEPB Inhale 1 puff into the lungs in the morning and at bedtime.   levothyroxine  (SYNTHROID ) 25 MCG tablet TAKE 1 TABLET BY MOUTH ONCE DAILY BEFORE BREAKFAST   Magnesium 500 MG CAPS Take 1 capsule by mouth at bedtime.   Multiple Vitamin (MULTI-VITAMIN) tablet Take 1 tablet by mouth daily.   tretinoin (RETIN-A) 0.025 % cream Apply  topically at bedtime.   UNABLE TO FIND Metronidazole 1% + Ivermectin 1% Apply to face nightly   valACYclovir  (VALTREX ) 1000 MG tablet Take 2 tablets and repeat in 12 hours as needed for fever blister treatment.   "

## 2024-09-27 NOTE — Patient Instructions (Signed)
 VISIT SUMMARY:  Today, we discussed your asthma and how you are managing your symptoms. You mentioned that your cough is manageable as long as you keep your throat from getting too dry, and you are diligent about using your inhaler regularly. We also reviewed the results of your recent test for airway inflammation.  YOUR PLAN:  -COUGH VARIANT ASTHMA: Cough variant asthma is a type of asthma where the main symptom is a dry, non-productive cough. Your symptoms are well-controlled with your current medication, Advair . Please continue using Advair  at the current strength. We will have a follow-up appointment in four months to check your airway inflammation again.  INSTRUCTIONS:  Please continue using Advair  as prescribed. We will see you again in four months to recheck your airway inflammation.

## 2024-09-29 ENCOUNTER — Encounter: Payer: Self-pay | Admitting: Pulmonary Disease

## 2024-10-07 ENCOUNTER — Ambulatory Visit (HOSPITAL_BASED_OUTPATIENT_CLINIC_OR_DEPARTMENT_OTHER): Payer: PPO | Admitting: Obstetrics & Gynecology

## 2024-10-07 ENCOUNTER — Other Ambulatory Visit: Payer: Self-pay | Admitting: Obstetrics & Gynecology

## 2024-10-07 DIAGNOSIS — Z1231 Encounter for screening mammogram for malignant neoplasm of breast: Secondary | ICD-10-CM

## 2024-11-09 ENCOUNTER — Ambulatory Visit

## 2024-11-23 ENCOUNTER — Ambulatory Visit: Admit: 2024-11-23 | Admitting: Internal Medicine

## 2024-11-29 ENCOUNTER — Ambulatory Visit: Admit: 2024-11-29

## 2024-11-29 SURGERY — COLONOSCOPY
Anesthesia: General

## 2025-01-27 ENCOUNTER — Ambulatory Visit: Admitting: Pulmonary Disease
